# Patient Record
Sex: Female | Born: 1940 | Race: White | Hispanic: No | Marital: Married | Smoking: Never smoker
Health system: Southern US, Community
[De-identification: ages and names within clinical notes are randomized; demographics above are authoritative.]

## PROBLEM LIST (undated history)

## (undated) DIAGNOSIS — L719 Rosacea, unspecified: Secondary | ICD-10-CM

## (undated) DIAGNOSIS — G250 Essential tremor: Secondary | ICD-10-CM

## (undated) DIAGNOSIS — H409 Unspecified glaucoma: Secondary | ICD-10-CM

## (undated) DIAGNOSIS — E039 Hypothyroidism, unspecified: Secondary | ICD-10-CM

## (undated) DIAGNOSIS — E119 Type 2 diabetes mellitus without complications: Secondary | ICD-10-CM

## (undated) DIAGNOSIS — A3790 Whooping cough, unspecified species without pneumonia: Secondary | ICD-10-CM

## (undated) DIAGNOSIS — J309 Allergic rhinitis, unspecified: Secondary | ICD-10-CM

## (undated) DIAGNOSIS — I10 Essential (primary) hypertension: Secondary | ICD-10-CM

## (undated) DIAGNOSIS — M1990 Unspecified osteoarthritis, unspecified site: Secondary | ICD-10-CM

## (undated) DIAGNOSIS — I2510 Atherosclerotic heart disease of native coronary artery without angina pectoris: Secondary | ICD-10-CM

## (undated) DIAGNOSIS — E785 Hyperlipidemia, unspecified: Secondary | ICD-10-CM

## (undated) HISTORY — PX: CATARACT EXTRACTION: SUR2

## (undated) HISTORY — PX: RETINAL DETACHMENT SURGERY: SHX105

## (undated) HISTORY — DX: Essential tremor: G25.0

## (undated) HISTORY — DX: Rosacea, unspecified: L71.9

## (undated) HISTORY — DX: Hyperlipidemia, unspecified: E78.5

## (undated) HISTORY — DX: Unspecified glaucoma: H40.9

## (undated) HISTORY — DX: Allergic rhinitis, unspecified: J30.9

## (undated) HISTORY — PX: EYE SURGERY: SHX253

## (undated) HISTORY — DX: Whooping cough, unspecified species without pneumonia: A37.90

## (undated) HISTORY — DX: Hypothyroidism, unspecified: E03.9

## (undated) HISTORY — DX: Type 2 diabetes mellitus without complications: E11.9

## (undated) HISTORY — DX: Essential (primary) hypertension: I10

---

## 1999-02-17 ENCOUNTER — Other Ambulatory Visit: Admission: RE | Admit: 1999-02-17 | Discharge: 1999-02-17 | Payer: Self-pay | Admitting: *Deleted

## 1999-07-29 ENCOUNTER — Encounter: Payer: Self-pay | Admitting: Emergency Medicine

## 1999-07-29 ENCOUNTER — Emergency Department (HOSPITAL_COMMUNITY): Admission: EM | Admit: 1999-07-29 | Discharge: 1999-07-29 | Payer: Self-pay | Admitting: Emergency Medicine

## 1999-09-29 ENCOUNTER — Emergency Department (HOSPITAL_COMMUNITY): Admission: EM | Admit: 1999-09-29 | Discharge: 1999-09-29 | Payer: Self-pay | Admitting: Emergency Medicine

## 2000-09-06 ENCOUNTER — Other Ambulatory Visit: Admission: RE | Admit: 2000-09-06 | Discharge: 2000-09-06 | Payer: Self-pay | Admitting: *Deleted

## 2003-12-18 ENCOUNTER — Other Ambulatory Visit: Admission: RE | Admit: 2003-12-18 | Discharge: 2003-12-18 | Payer: Self-pay | Admitting: Family Medicine

## 2004-02-18 ENCOUNTER — Encounter: Admission: RE | Admit: 2004-02-18 | Discharge: 2004-02-18 | Payer: Self-pay | Admitting: Gastroenterology

## 2005-03-25 IMAGING — CR DG BE W/ AIR HIGH DENSITY
12 of 21 series · 12 of 21 positions shown · non-contrast
Comparison: none

CLINICAL DATA: Colon cancer screening, sigmoidoscopy earlier today.  
AIR CONTRAST BARIUM ENEMA: 
The preliminary supine and erect views of the abdomen demonstrate normal caliber gas filled colon and small bowel loops.  No free peritoneal air.  Mid to lower thoracic spine degenerative changes and mild lumbar spine degenerative changes. 
An air contrast barium enema examination of the colon demonstrates normal caliber and contour of the colon with no constricting lesions, masses, or mucosal abnormalities.  The appendix is incompletely opacified.  Barium refluxed into a normal appearing terminal ileum.

[run (1 of 6)]
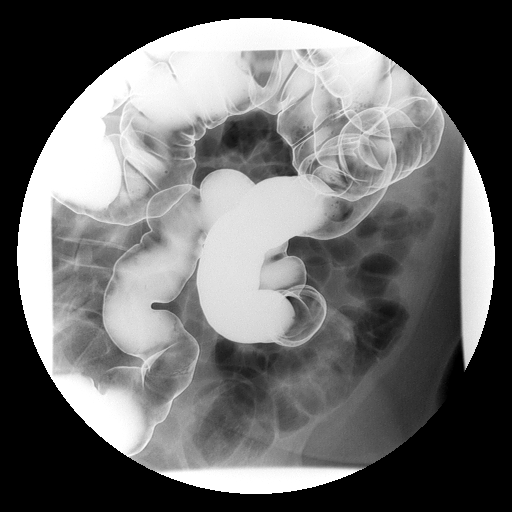

[run (2 of 6)]
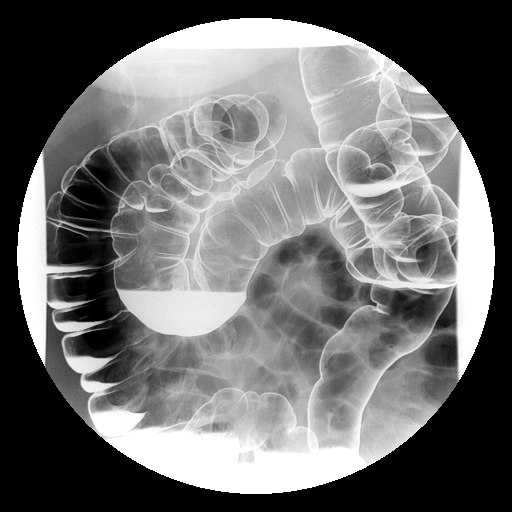

[run (3 of 6)]
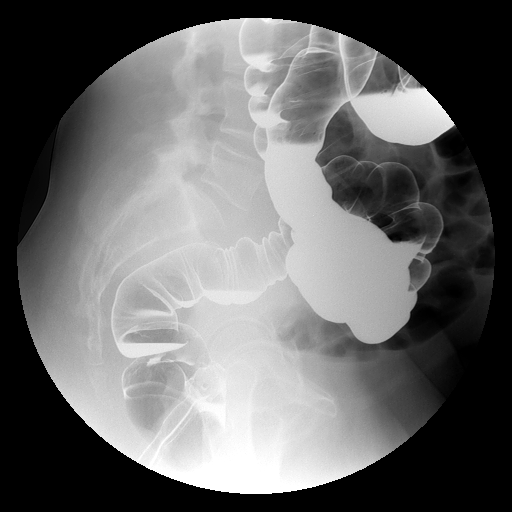

[run (4 of 6)]
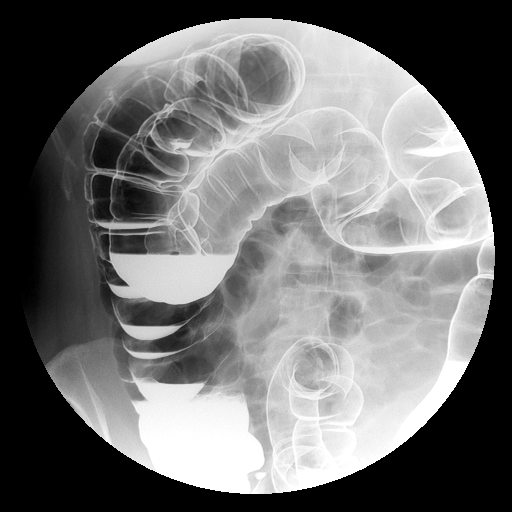

[run (5 of 6)]
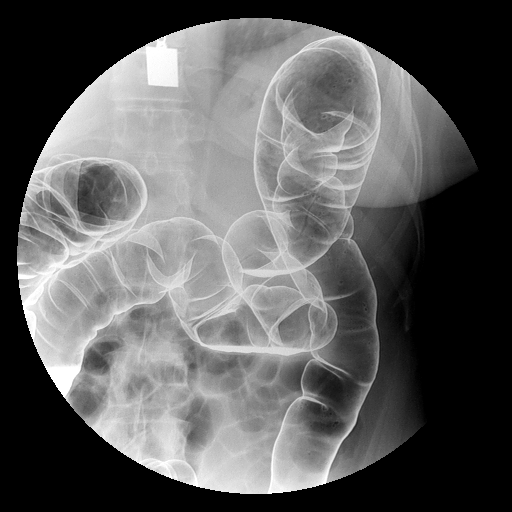

[run (6 of 6)]
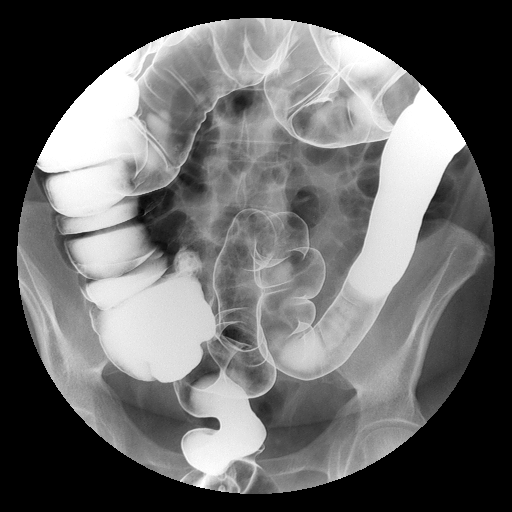

[view not recorded (1 of 6)]
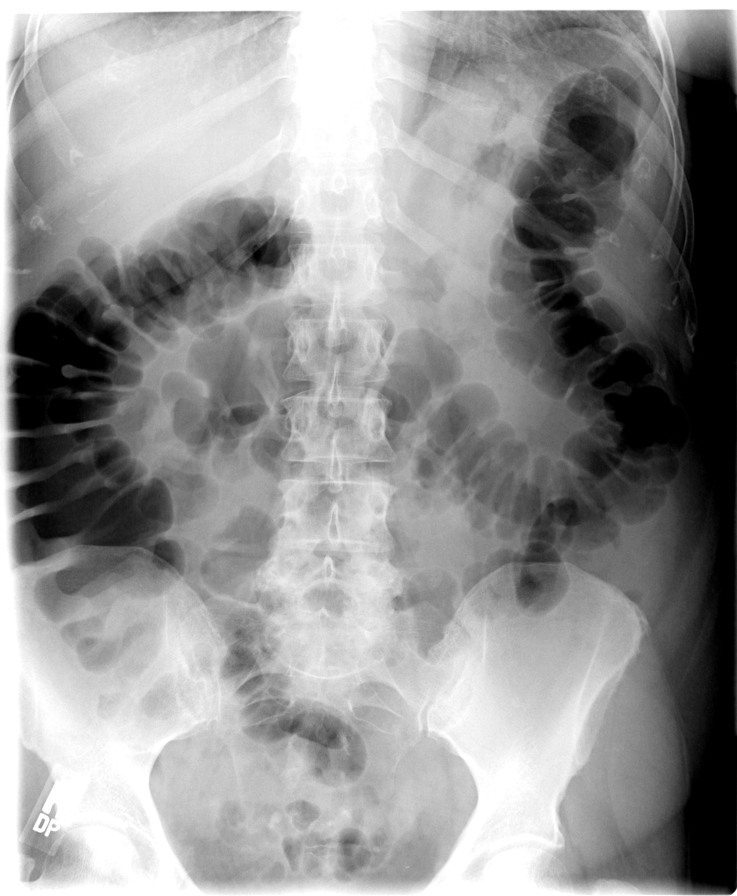

[view not recorded (2 of 6)]
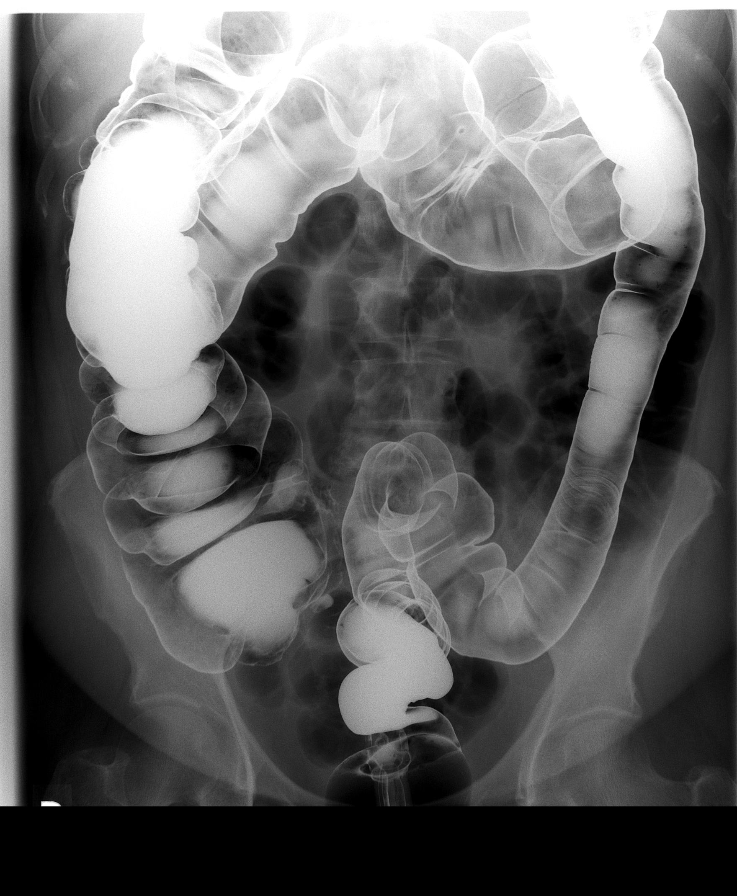

[view not recorded (3 of 6)]
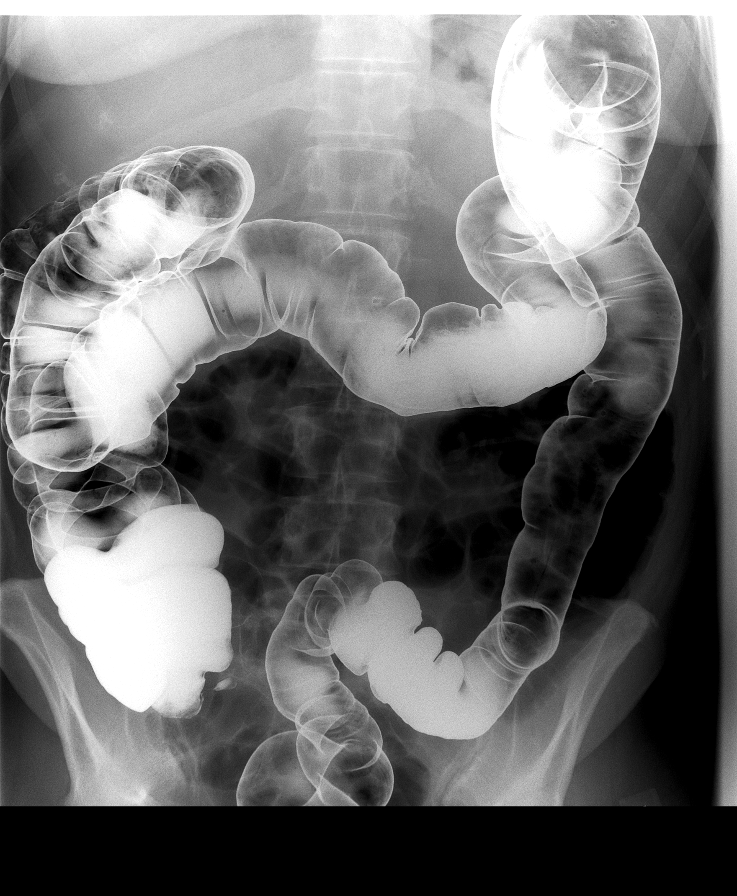

[view not recorded (4 of 6)]
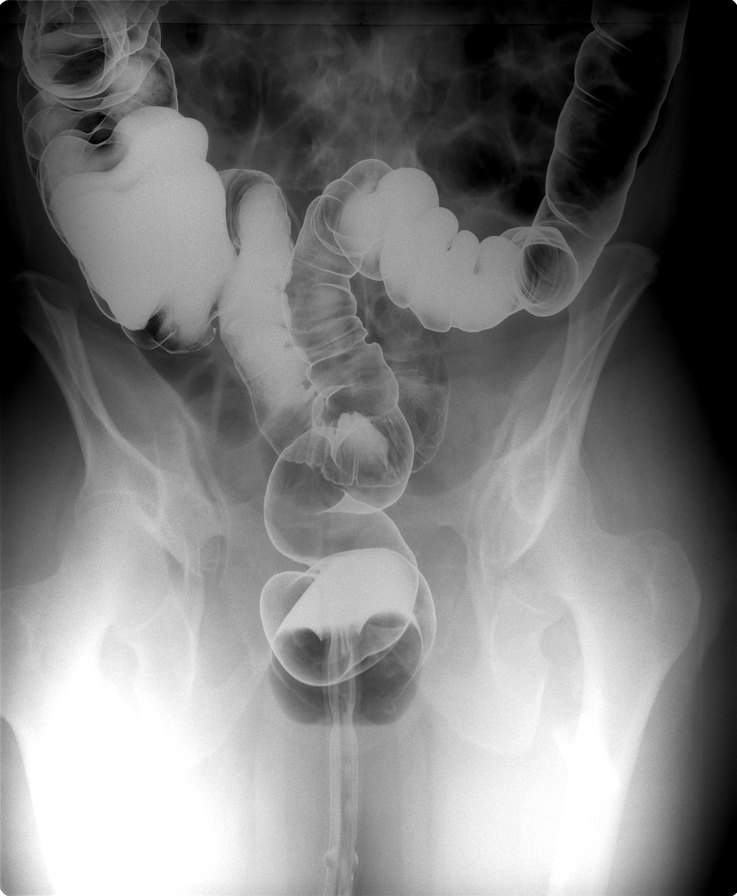

[view not recorded (5 of 6)]
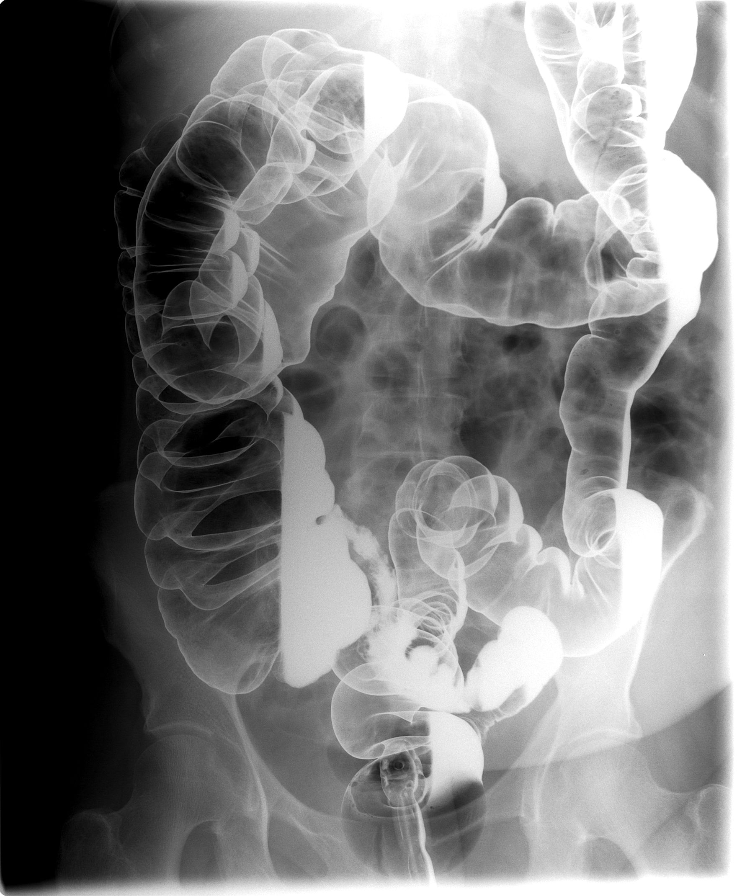

[view not recorded (6 of 6)]
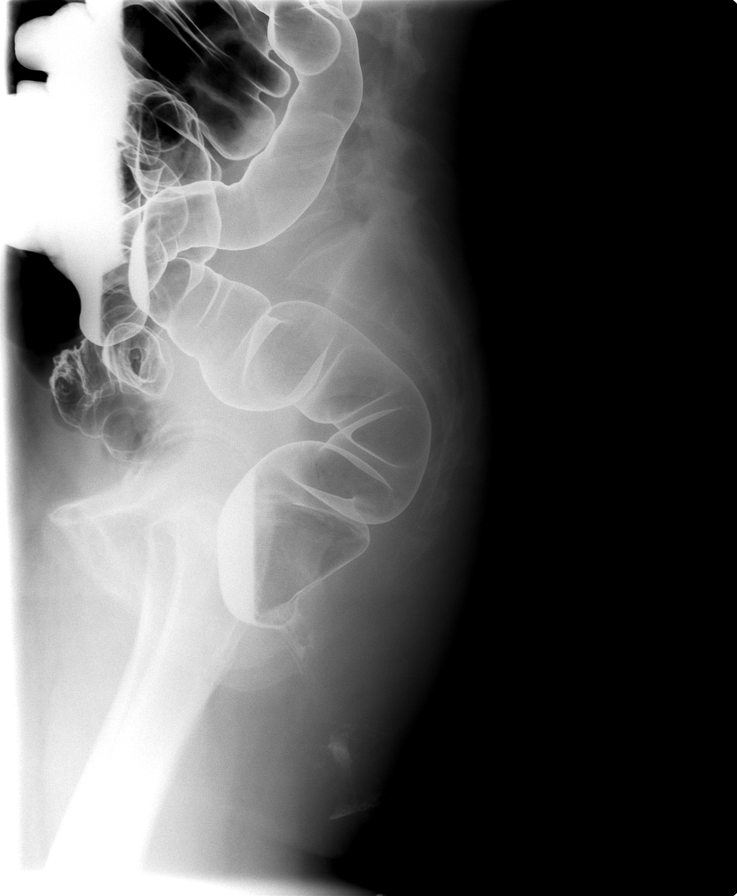

[12 of 21 positions shown; findings below may reference images not displayed]

IMPRESSION: Normal examination.

## 2005-09-01 ENCOUNTER — Other Ambulatory Visit: Admission: RE | Admit: 2005-09-01 | Discharge: 2005-09-01 | Payer: Self-pay | Admitting: Family Medicine

## 2006-11-30 ENCOUNTER — Other Ambulatory Visit: Admission: RE | Admit: 2006-11-30 | Discharge: 2006-11-30 | Payer: Self-pay | Admitting: Family Medicine

## 2008-11-05 DIAGNOSIS — H409 Unspecified glaucoma: Secondary | ICD-10-CM

## 2008-11-05 HISTORY — DX: Unspecified glaucoma: H40.9

## 2008-12-07 ENCOUNTER — Other Ambulatory Visit: Admission: RE | Admit: 2008-12-07 | Discharge: 2008-12-07 | Payer: Self-pay | Admitting: Family Medicine

## 2011-03-09 ENCOUNTER — Other Ambulatory Visit: Payer: Self-pay | Admitting: Family Medicine

## 2011-03-09 ENCOUNTER — Other Ambulatory Visit (HOSPITAL_COMMUNITY)
Admission: RE | Admit: 2011-03-09 | Discharge: 2011-03-09 | Disposition: A | Discharge status: Home / Self Care | Payer: Medicare Other | Source: Ambulatory Visit | Attending: Family Medicine | Admitting: Family Medicine

## 2011-03-09 DIAGNOSIS — Z124 Encounter for screening for malignant neoplasm of cervix: Secondary | ICD-10-CM | POA: Insufficient documentation

## 2012-06-07 ENCOUNTER — Other Ambulatory Visit: Payer: Self-pay | Admitting: Internal Medicine

## 2012-06-07 DIAGNOSIS — E039 Hypothyroidism, unspecified: Secondary | ICD-10-CM

## 2012-06-09 ENCOUNTER — Ambulatory Visit
Admission: RE | Admit: 2012-06-09 | Discharge: 2012-06-09 | Disposition: A | Discharge status: Home / Self Care | Payer: Medicare Other | Source: Ambulatory Visit | Attending: Internal Medicine | Admitting: Internal Medicine

## 2012-06-09 DIAGNOSIS — E039 Hypothyroidism, unspecified: Secondary | ICD-10-CM

## 2013-07-10 ENCOUNTER — Telehealth: Payer: Self-pay | Admitting: Interventional Cardiology

## 2013-07-10 NOTE — Telephone Encounter (Signed)
New problem   Pt is coming in office tomorrow morning and they need to know today if pt can take epinephrine. Please advise

## 2013-07-10 NOTE — Telephone Encounter (Signed)
Will forward to dr/cma

## 2013-07-10 NOTE — Telephone Encounter (Signed)
Spoke with pt and Dr. Jolayne Haines pts dentist wants to fix pt crown, but pts is concerned that they may give her a medication that may make her heart beat faster.  Pt didnt go off propranolol because she had trouble trying to come off medication. Please Advise.

## 2013-07-11 NOTE — Telephone Encounter (Signed)
notified Donna Guerrero per Dr. Eldridge Dace pt should not have epinephrine (caused fat HR's) and they should use carbocaine.

## 2013-07-11 NOTE — Telephone Encounter (Signed)
Follow up    Pt's dental procedure is today at 10:30 and they need prior approval for medication . Please advise

## 2013-07-15 IMAGING — US US SOFT TISSUE HEAD/NECK
1 series · 14 of 25 positions shown · non-contrast
Comparison: None.

CLINICAL DATA: Hypothyroidism, history of neck radiation, possible
nodules

THYROID ULTRASOUND
TECHNIQUE: Ultrasound examination of the thyroid gland and adjacent
soft tissues was performed.

[Series 1: us soft tissue head/neck · 0.07mm/px · 14 of 39 slices shown]
[im 1/39]
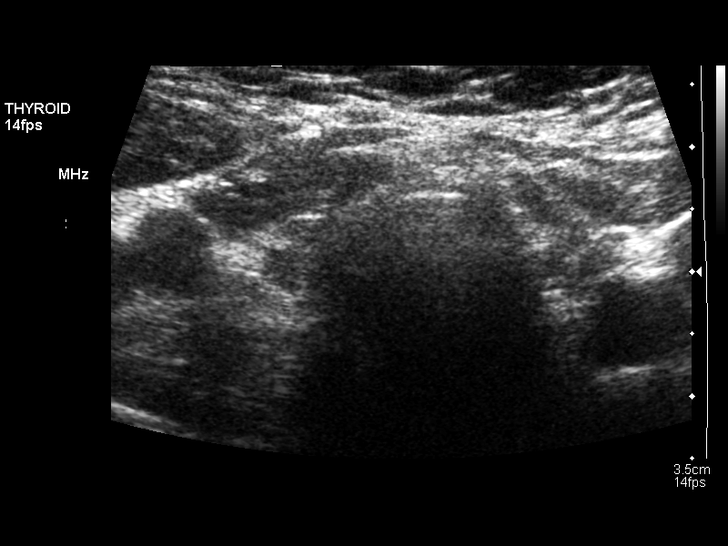
[im 4/39]
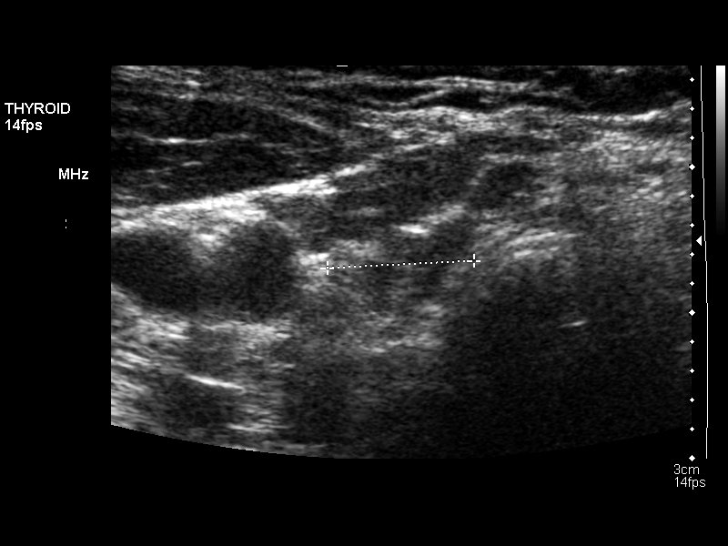
[im 7/39]
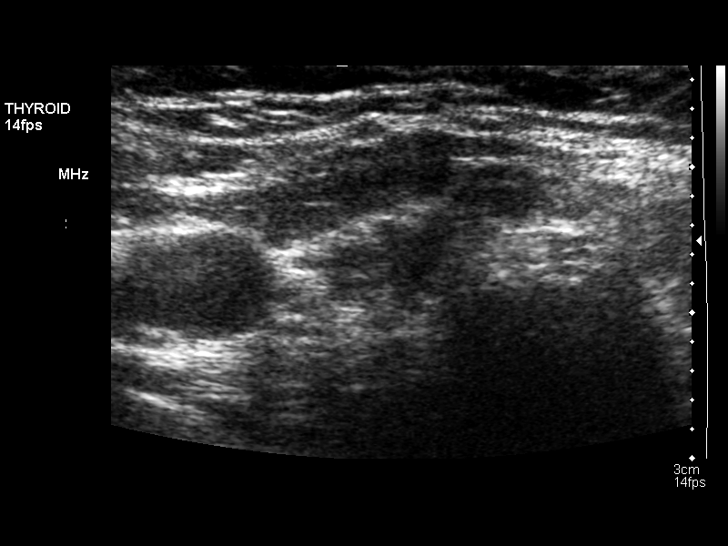
[im 10/39]
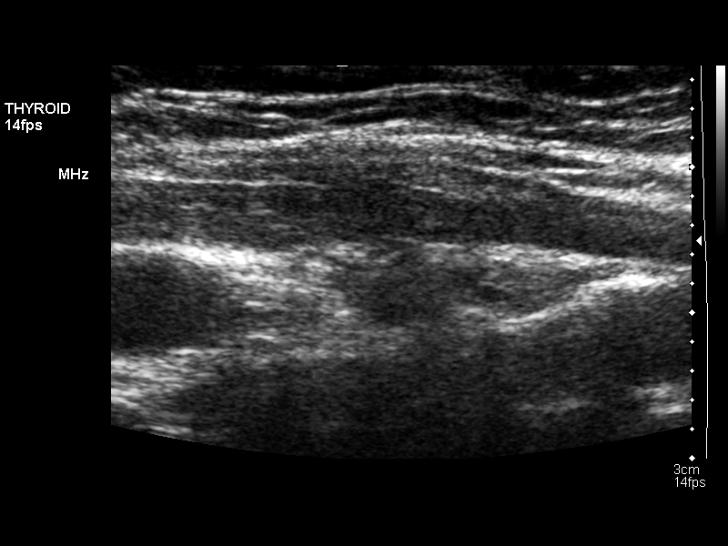
[im 13/39]
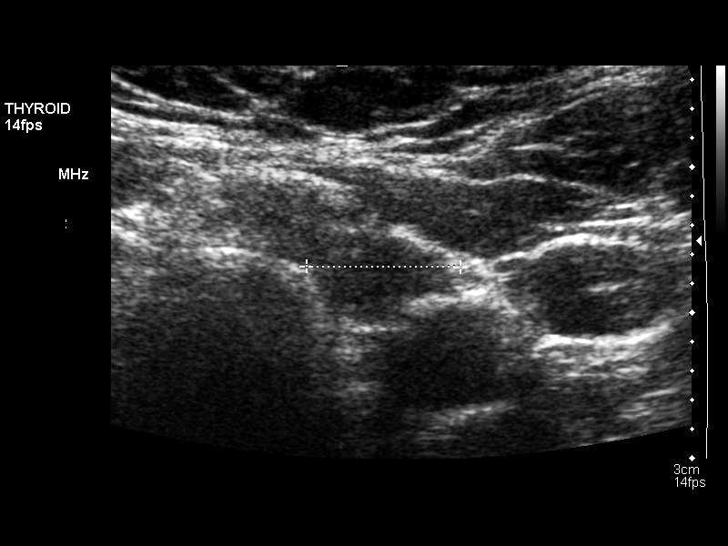
[im 15/39]
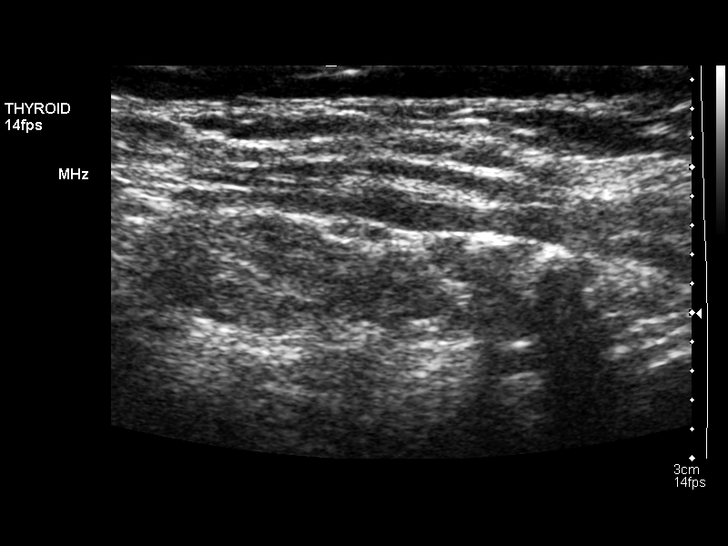
[im 18/39]
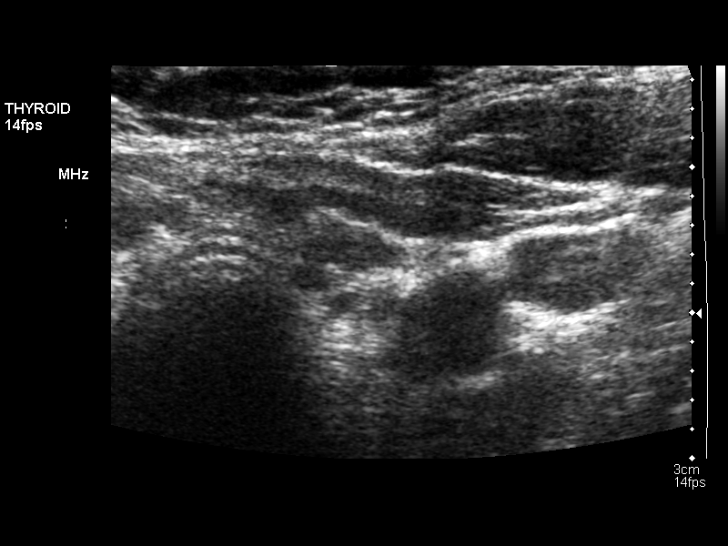
[im 21/39]
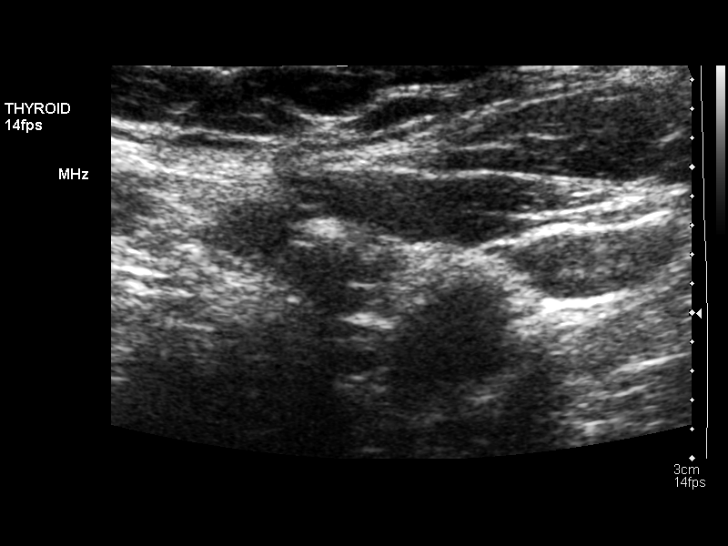
[im 24/39]
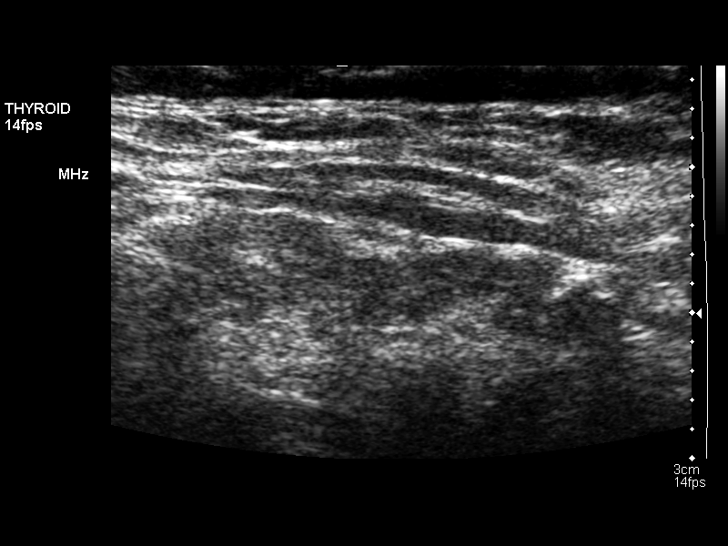
[im 26/39]
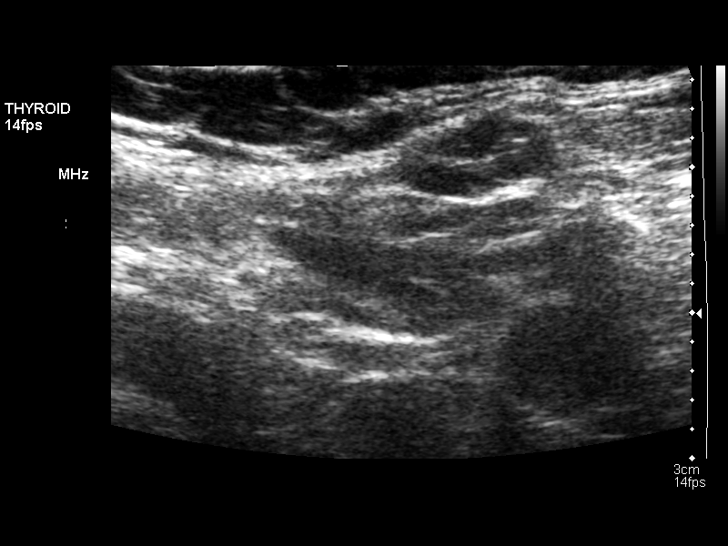
[im 29/39]
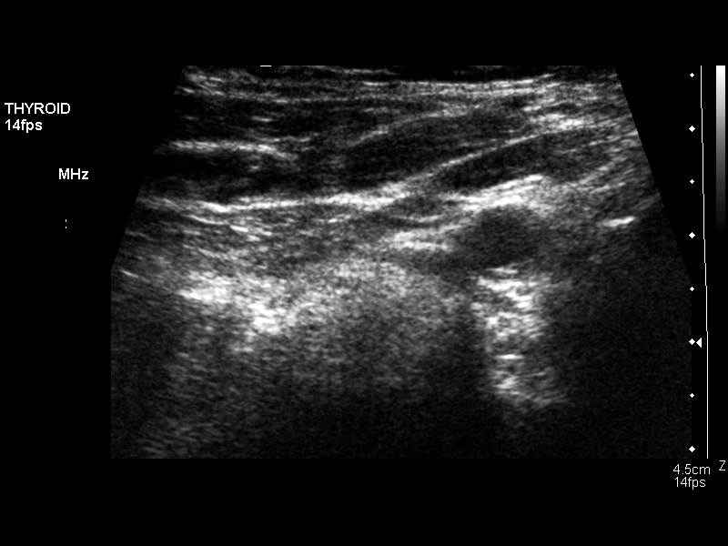
[im 32/39]
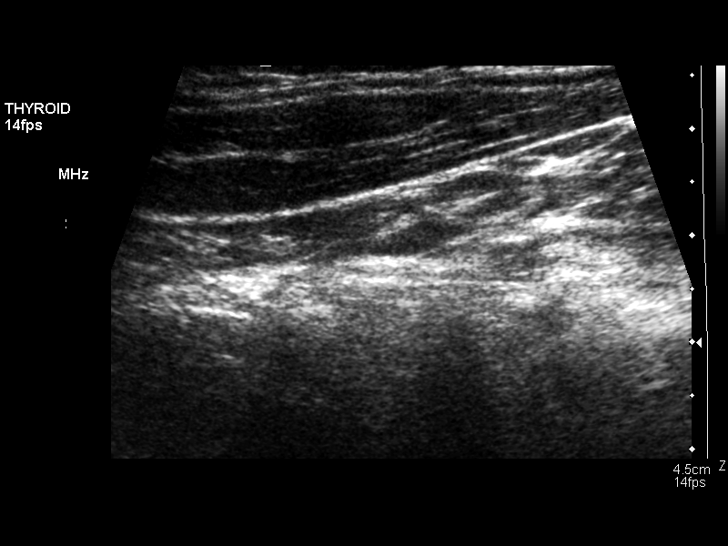
[im 35/39]
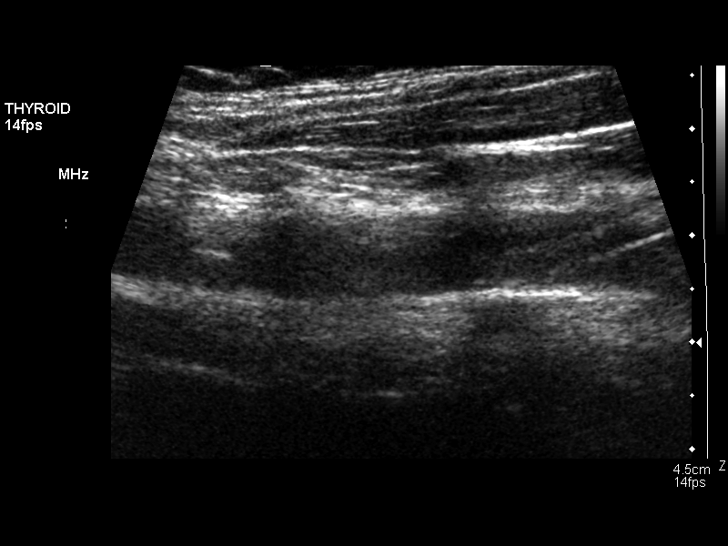
[im 39/39]
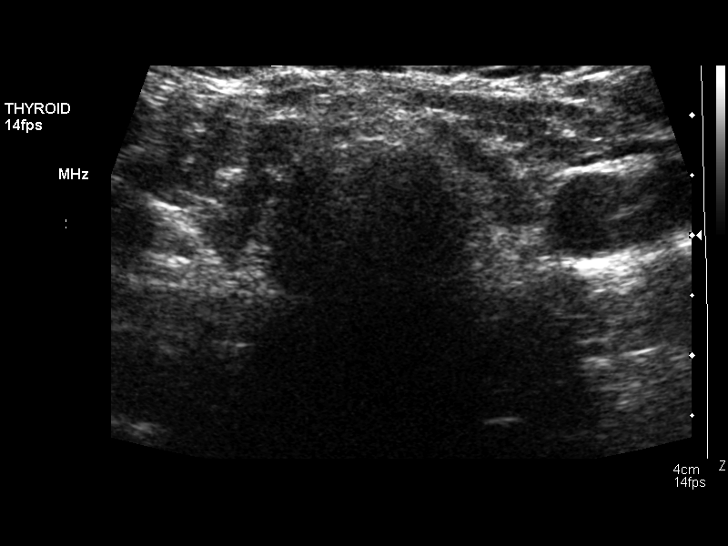

[14 of 25 positions shown; findings below may reference images not displayed]

FINDINGS: Right thyroid lobe:  Measures 1.7 x 0.8 x 1.0 cm.
Left thyroid lobe:  Measures 3.2 x 0.9 x 1.1 cm
Isthmus:  Measures 4 mm in thickness.

Focal nodules:  Two possible 3 mm calcifications in the left lower
gland.

Lymphadenopathy:  None visualized.
IMPRESSION: Two possible 3 mm calcifications in the left lower gland.

No discrete thyroid nodules.

## 2013-10-03 ENCOUNTER — Encounter: Payer: Self-pay | Admitting: Cardiology

## 2013-10-03 DIAGNOSIS — E118 Type 2 diabetes mellitus with unspecified complications: Secondary | ICD-10-CM | POA: Insufficient documentation

## 2013-10-03 DIAGNOSIS — E119 Type 2 diabetes mellitus without complications: Secondary | ICD-10-CM | POA: Insufficient documentation

## 2013-10-03 DIAGNOSIS — E782 Mixed hyperlipidemia: Secondary | ICD-10-CM

## 2013-10-03 DIAGNOSIS — I10 Essential (primary) hypertension: Secondary | ICD-10-CM | POA: Insufficient documentation

## 2013-10-03 DIAGNOSIS — E039 Hypothyroidism, unspecified: Secondary | ICD-10-CM | POA: Insufficient documentation

## 2013-10-12 ENCOUNTER — Ambulatory Visit: Payer: Medicare Other | Admitting: Interventional Cardiology

## 2013-11-14 ENCOUNTER — Ambulatory Visit (INDEPENDENT_AMBULATORY_CARE_PROVIDER_SITE_OTHER): Payer: Medicare Other | Admitting: Interventional Cardiology

## 2013-11-14 ENCOUNTER — Encounter: Payer: Self-pay | Admitting: Interventional Cardiology

## 2013-11-14 VITALS — BP 170/70 | HR 60 | Ht 60.0 in | Wt 150.0 lb

## 2013-11-14 DIAGNOSIS — E782 Mixed hyperlipidemia: Secondary | ICD-10-CM

## 2013-11-14 DIAGNOSIS — R0602 Shortness of breath: Secondary | ICD-10-CM

## 2013-11-14 DIAGNOSIS — I10 Essential (primary) hypertension: Secondary | ICD-10-CM

## 2013-11-14 DIAGNOSIS — Z79899 Other long term (current) drug therapy: Secondary | ICD-10-CM

## 2013-11-14 MED ORDER — LISINOPRIL 40 MG PO TABS: 40.0000 mg | ORAL_TABLET | Freq: Every day | ORAL | Status: DC

## 2013-11-14 MED ORDER — HYDROCHLOROTHIAZIDE 25 MG PO TABS: 25.0000 mg | ORAL_TABLET | Freq: Every day | ORAL | Status: DC

## 2013-11-14 NOTE — Patient Instructions (Addendum)
Your physician has recommended you make the following change in your medication:   1. Stop Lisinopril-HCTZ 20-25 mg.   2. Start Lisinopril 40 mg and HCTZ 25 mg daily.   Your physician recommends that you schedule a follow-up appointment in: 2 months with Dr. Irish Lack.  Your physician recommends that you return for lab work in: 1 week for [facility] on 11/21/13.   I will call you back to schedule Vascular screening of your Carotids and Aorta.

## 2013-11-14 NOTE — Progress Notes (Signed)
Patient ID: Donna Guerrero, female   DOB: [birth year], 73 y.o.   MRN: [id number]    [address], [address] [place], [place]  [address] Phone: [phone] Fax:  [phone]  Date:  11/14/2013   ID:  Donna Guerrero, DOB 04/12/1941, MRN [id number]  PCP:  Osborne Casco, MD      History of Present Illness: Donna Guerrero is a 73 y.o. female who has had HTN. Propranolol was switched to Metoprolol. She has been on simvastatin and atorvastatin but had side effects from both. Crestor was started. She is now off of the crestor. Hbg A1C was elevated so Amaryl was started. Amaryl was stopped as well. Metformin was started. BP has been high in the doctors office. At home, BP is 135-145/70 range. She had muscle pains with atorvastatin. She feels better off of the statins. She is tolerating Zetia. She has not been checking her BP as regularly. Hypertension:  She was doing Zumba for seniors twice a week. She walks in the gym as well. No symptoms. Denies : Chest pain.  Dizziness.  Palpitations.  Syncope.   In 9/14, she tried to taper her propranolol.  She had some shakiness and withdrawal sx.  She is unsure if she wants to be on the beta blocker or not.  SHe had some SHOB walkig up an incline.  SHe typically walks on flat surfaces.  Thyroid meds to be adjusted.  She has ben intolerant of multiple statins due to muscle pain and headaches. Dr. Buddy Duty recommended welchol.  She has been under a lot of stress at home due to some family issues. She thinks her blood pressure is increased because of this as well.   Wt Readings from Last 3 Encounters:  11/14/13 150 lb (68.04 kg)     Past Medical History  Diagnosis Date  . Diabetes mellitus without complication     TYPE II  . Hypertension   . Thyroid disease     HYPOTHYROIDISM  . Essential tremor   . Allergic rhinitis   . Rosacea   . Whooping cough   . 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 Hyperlipidemia     INTOLERANT OF STATINS    Current Outpatient  Prescriptions  Medication Sig Dispense Refill  . aspirin 81 MG tablet Take 81 mg by mouth daily.      Marland Kitchen ezetimibe (ZETIA) 10 MG tablet Take 10 mg by mouth daily.      . fexofenadine (ALLEGRA) 180 MG tablet Take 180 mg by mouth as needed for allergies or rhinitis.      Marland Kitchen glucose blood test strip 1 each by Other route as needed for other. Use as instructed      . latanoprost (XALATAN) 0.005 % ophthalmic solution 1 drop at bedtime.      Marland Kitchen levothyroxine (SYNTHROID, LEVOTHROID) 137 MCG tablet Take 137 mcg by mouth daily before breakfast. Reduced to 125 mcg      . lisinopril-hydrochlorothiazide (PRINZIDE,ZESTORETIC) 20-25 MG per tablet Take 1 tablet by mouth daily.      . metFORMIN (GLUMETZA) 500 MG (MOD) 24 hr tablet Take 500 mg by mouth daily with breakfast.      . Multiple Vitamins-Minerals (MULTIVITAMIN PO) Take by mouth daily.      . naproxen sodium (ANAPROX) 220 MG tablet Take 220 mg by mouth 2 (two) times daily with a meal.      . propranolol (INDERAL) 80 MG tablet Take 80 mg by mouth daily.  No current facility-administered medications for this visit.    Allergies:    Allergies  Allergen Reactions  . Amaryl [Glimepiride] Other (See Comments)    headache  . Atorvastatin     MYALGIAS  . Codeine Nausea Only  . Crestor [Rosuvastatin]     Myalgias  . Simvastatin Nausea Only  . Welchol [Colesevelam Hcl]     Tablets were too big    Social History:  The patient  reports that she has never smoked. She does not have any smokeless tobacco history on file. She reports that she drinks about 1.2 ounces of alcohol per week. She reports that she does not use illicit drugs.   Family History:  The patient's family history includes CAD in her father; Heart disease in her mother.   ROS:  Please see the history of present illness.  No nausea, vomiting.  No fevers, chills.  No focal weakness.  No dysuria.   All other systems reviewed and negative.   PHYSICAL EXAM: VS:  BP 170/70  Pulse 60   Ht 5' (1.524 m)  Wt 150 lb (68.04 kg)  BMI 29.30 kg/m2 Well nourished, well developed, in no acute distress HEENT: normal Neck: no JVD, no carotid bruits Cardiac:  normal S1, S2; RRR;  Lungs:  clear to auscultation bilaterally, no wheezing, rhonchi or rales Abd: soft, nontender, no hepatomegaly Ext: no edema Skin: warm and dry Neuro:   no focal abnormalities noted  EKG:  NSR, septal Q waves, no ST segment changes     ASSESSMENT AND PLAN:  Benign hypertension   stop Lisinopril-Hydrochlorothiazide Tablet, 20-25 MG, 1 tablet, once a day Not well controlled today. She has had a lot of stress at home of late.  She should have a systolic BP less than 528 given increased blood sugar. She should check her BP at home. Diastolic should be less than 80. Agree with change to Metoprolol. increase lisinopril since BP consistently over 130/80.   Increase lisinopril to 40 mg a day. Continue HCTZ 25 mg daily.   Mixed hyperlipidemia  Continue Zetia Tablet, 10 MG, 1 tablet, Orally, Once a day LDL target < 100. Did not tolerate Lipitor or Zocor. Now on Zetia. WelChol would be reasonable. She is somewhat hesitant to start any medications due to problems with cholesterol lowering medicines she's had in the past. We'll have our pharmD review her lipids and give a recommendation.  Dyspnea on exertion: This occurred while walking up an incline. She does not typically walk on uneven surfaces. She is okay walking on flat surfaces. I think it is likely deconditioning. There are no overt signs of heart failure.   Signed, Mina Marble, MD, [facility] 11/14/2013 12:38 PM

## 2013-11-15 ENCOUNTER — Other Ambulatory Visit: Payer: Self-pay | Admitting: Pharmacist

## 2013-11-15 DIAGNOSIS — E782 Mixed hyperlipidemia: Secondary | ICD-10-CM

## 2013-11-15 NOTE — Telephone Encounter (Signed)
Patient with h/o diabetes and elevated LDL 155 mg/dL on Zetia monotherapy.   Her labs from Dr. Buddy Duty are getting scanned in:  LDL 155, TC 237, HDL 63, TG 96, glucose 136, LFTs normal, renal fxn normal. Intolerant to Zocor, Lipitor, Crestor - muscle aches. She failed Welchol pills in past due to size of pills (these are large - and 6 daily).   Patient is hesitant to use lipid lowering medications given her past failures.  Dr. Buddy Duty recommended Welchol again, which I think is a good decision - I would instead use the Welchol powder as this is mixed in 4-8 ounces of water and juice and she can drink once daily.  Large pills no longer a concern.  Welchol is not a statin, and doesn't cause muscle aches.  She will need to separate this from her thyroid medication though. Plan: 1.  Continue Zetia. 2.  Add Welchol powder for suspension - mix 1 packet in 4-8 ounces of juice or water and drink once daily.  I recommend doing this in the evening, and the thyroid dose in the morning so they don't interact. 3.  Recheck lipid panel and hepatic panel 3 months later. Please notify patient, update meds, and set up labs. Thanks.

## 2013-11-17 ENCOUNTER — Ambulatory Visit (HOSPITAL_COMMUNITY): Payer: Medicare Other | Attending: Interventional Cardiology

## 2013-11-17 ENCOUNTER — Ambulatory Visit (HOSPITAL_COMMUNITY): Payer: Medicare Other

## 2013-11-17 DIAGNOSIS — I10 Essential (primary) hypertension: Secondary | ICD-10-CM

## 2013-11-17 NOTE — Telephone Encounter (Signed)
Patient should start Welchol daily then and recheck lipid/liver in 3 months. Please find out why Zetia was stopped and document it in this note.  Thanks.

## 2013-11-17 NOTE — Telephone Encounter (Addendum)
Donna Guerrero, pt was in the office today and she is no longer taking zetia. She told someone at last OV but they didn't take it off her list.

## 2013-11-20 NOTE — Telephone Encounter (Signed)
Needs to take Welchol 625 mg pills - 3 tablets bid.  Please send in prescription for this, and set up lipid / liver for 3 months later.

## 2013-11-20 NOTE — Telephone Encounter (Signed)
Spoke with pt and she had problems with all statins. Pt also tried zetia and had myalgias and HA.

## 2013-11-20 NOTE — Telephone Encounter (Signed)
Pt is willing to take the welchol but she doesn't want to take the powder. She would like to try the welchol tablet. Please advise.

## 2013-11-21 ENCOUNTER — Other Ambulatory Visit: Payer: Medicare Other

## 2013-11-22 MED ORDER — COLESEVELAM HCL 625 MG PO TABS: ORAL_TABLET | ORAL | Status: DC

## 2013-11-22 NOTE — Telephone Encounter (Signed)
Pt notified. Meds updated. Labs ordered.  

## 2013-11-23 ENCOUNTER — Other Ambulatory Visit (INDEPENDENT_AMBULATORY_CARE_PROVIDER_SITE_OTHER): Payer: Medicare Other

## 2013-11-23 DIAGNOSIS — Z79899 Other long term (current) drug therapy: Secondary | ICD-10-CM

## 2013-11-23 LAB — BASIC METABOLIC PANEL
BUN: 19 mg/dL (ref 6–23)
CALCIUM: 9.1 mg/dL (ref 8.4–10.5)
CO2: 27 mEq/L (ref 19–32)
Chloride: 96 mEq/L (ref 96–112)
Creatinine, Ser: 0.7 mg/dL (ref 0.4–1.2)
GFR: 81.93 mL/min (ref >60.00)
GLUCOSE: 243 mg/dL — AB (ref 70–99)
Potassium: 3.3 mEq/L — ABNORMAL LOW (ref 3.5–5.1)
Sodium: 134 mEq/L — ABNORMAL LOW (ref 135–145)

## 2013-11-24 ENCOUNTER — Telehealth: Payer: Self-pay | Admitting: Cardiology

## 2013-11-24 DIAGNOSIS — E876 Hypokalemia: Secondary | ICD-10-CM

## 2013-11-24 MED ORDER — POTASSIUM CHLORIDE CRYS ER 20 MEQ PO TBCR: 20.0000 meq | EXTENDED_RELEASE_TABLET | Freq: Every day | ORAL | Status: DC

## 2013-11-24 NOTE — Telephone Encounter (Signed)
Pt notified. Meds updated. Pt spoke with pharmacy regarding welchol and it was going to cost her $150 a month (which she can not afford). Pt obtained some books on cholesterol and would like to try and lose weight. Also, she will watch her diet and maybe think about trying another statin medication. She would like to wait until appt in April to see where she is at with her diet and weigh loss.

## 2013-11-24 NOTE — Telephone Encounter (Signed)
Pt notified. FYI to Dr. Irish Lack.

## 2013-11-24 NOTE — Telephone Encounter (Signed)
Can't tolerate multiple statins or Zetia, and Welchol cost too much.  Okay to work on diet and exercise until 01/2014.  She may qualify for PCSK-9 study.  Can discuss in 01/2014 if not willing to try a statin.

## 2013-11-24 NOTE — Telephone Encounter (Signed)
Message copied by Alcario Drought on Fri Nov 24, 2013  1:04 PM ------      Message from: Jettie Booze      Created: Fri Nov 24, 2013 10:51 AM       Can start K-Dur 20 mEq daily. Recheck Bmet in one week. ------

## 2013-12-01 ENCOUNTER — Other Ambulatory Visit (INDEPENDENT_AMBULATORY_CARE_PROVIDER_SITE_OTHER): Payer: Medicare Other

## 2013-12-01 DIAGNOSIS — E876 Hypokalemia: Secondary | ICD-10-CM

## 2013-12-01 LAB — BASIC METABOLIC PANEL
BUN: 18 mg/dL (ref 6–23)
CO2: 29 mEq/L (ref 19–32)
Calcium: 9.4 mg/dL (ref 8.4–10.5)
Chloride: 100 mEq/L (ref 96–112)
Creatinine, Ser: 0.8 mg/dL (ref 0.4–1.2)
GFR: 75.97 mL/min (ref >60.00)
Glucose, Bld: 185 mg/dL — ABNORMAL HIGH (ref 70–99)
Potassium: 3.5 mEq/L (ref 3.5–5.1)
Sodium: 136 mEq/L (ref 135–145)

## 2014-01-01 ENCOUNTER — Other Ambulatory Visit: Payer: Self-pay | Admitting: Interventional Cardiology

## 2014-01-31 ENCOUNTER — Ambulatory Visit (INDEPENDENT_AMBULATORY_CARE_PROVIDER_SITE_OTHER): Payer: Medicare Other | Admitting: Interventional Cardiology

## 2014-01-31 ENCOUNTER — Encounter: Payer: Self-pay | Admitting: Internal Medicine

## 2014-01-31 ENCOUNTER — Encounter: Payer: Self-pay | Admitting: Interventional Cardiology

## 2014-01-31 VITALS — BP 138/84 | HR 58 | Ht 60.0 in | Wt 151.4 lb

## 2014-01-31 DIAGNOSIS — I10 Essential (primary) hypertension: Secondary | ICD-10-CM

## 2014-01-31 DIAGNOSIS — E782 Mixed hyperlipidemia: Secondary | ICD-10-CM

## 2014-01-31 NOTE — Progress Notes (Signed)
Patient ID: Donna Guerrero, female   DOB: 02/11/1941, 73 y.o.   MRN: [id number]    [place], [address] [place], [place]  [address] Phone: [phone] Fax:  [phone]  Date:  01/31/2014   ID:  Donna Guerrero, DOB 1941-08-04, MRN [id number]  PCP:  Osborne Casco, MD      History of Present Illness: Donna Guerrero is a 73 y.o. female who has had HTN. Propranolol was switched to Metoprolol. She has been on simvastatin and atorvastatin but had side effects from both. Crestor was started. She is now off of the crestor. Hbg A1C was elevated so Amaryl was started. Amaryl was stopped as well. Metformin was started. BP has been high in the doctors office. At home, BP is 135-145/70 range. She had muscle pains with atorvastatin. She feels better off of the statins. She is tolerating Zetia. She has not been checking her BP as regularly. Hypertension:  She was doing [employer]a for seniors twice a week. She walks in the gym as well. No symptoms. Denies : Chest pain.  Dizziness.  Palpitations.  Syncope.   In 9/14, she tried to taper her propranolol.  She had some shakiness and withdrawal sx.  She is unsure if she wants to be on the beta blocker or not.  SHe had some SHOB walkig up an incline.  SHe typically walks on flat surfaces.  Thyroid meds to be adjusted.  She has ben intolerant of multiple statins due to muscle pain and headaches. Dr. Buddy Duty recommended welchol.  She has been under a lot of stress at home due to some family issues. She thinks her blood pressure was increased because of this as well.  Since last visit, stress level is better.  BP is much better as well. She is continuing her propranolol.    Wt Readings from Last 3 Encounters:  01/31/14 151 lb 6.4 oz (68.675 kg)  11/14/13 150 lb (68.04 kg)     Past Medical History  Diagnosis Date  . Diabetes mellitus without complication     TYPE II  . Hypertension   . Thyroid disease     HYPOTHYROIDISM  . Essential tremor   .  Allergic rhinitis   . Rosacea   . Whooping cough   . 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 Hyperlipidemia     INTOLERANT OF STATINS    Current Outpatient Prescriptions  Medication Sig Dispense Refill  . aspirin 81 MG tablet Take 81 mg by mouth daily.      Marland Kitchen glucose blood test strip 1 each by Other route as needed for other. Use as instructed      . hydrochlorothiazide (HYDRODIURIL) 25 MG tablet Take 1 tablet (25 mg total) by mouth daily.  30 tablet  6  . latanoprost (XALATAN) 0.005 % ophthalmic solution 1 drop at bedtime.      Marland Kitchen levothyroxine (SYNTHROID, LEVOTHROID) 137 MCG tablet Take 112 mcg by mouth daily before breakfast. Reduced to 125 mcg      . lisinopril (PRINIVIL,ZESTRIL) 40 MG tablet Take 1 tablet (40 mg total) by mouth daily.  30 tablet  6  . metFORMIN (GLUMETZA) 500 MG (MOD) 24 hr tablet Take 500 mg by mouth daily with breakfast.      . naproxen sodium (ANAPROX) 220 MG tablet Take 220 mg by mouth 2 (two) times daily with a meal.      . potassium chloride SA (K-DUR,KLOR-CON) 20 MEQ tablet Take 1 tablet (20 mEq total) by mouth daily.  30 tablet  6  . propranolol (INDERAL) 80 MG tablet Take 80 mg by mouth daily.      . propranolol ER (INDERAL LA) 80 MG 24 hr capsule take 1 capsule by mouth once daily  30 capsule  0   No current facility-administered medications for this visit.    Allergies:    Allergies  Allergen Reactions  . Amaryl [Glimepiride] Other (See Comments)    headache  . Atorvastatin     MYALGIAS  . Codeine Nausea Only  . Crestor [Rosuvastatin]     Myalgias  . Simvastatin Nausea Only  . Welchol [Colesevelam Hcl]     Tablets were too big    Social History:  The patient  reports that she has never smoked. She does not have any smokeless tobacco history on file. She reports that she drinks about 1.2 ounces of alcohol per week. She reports that she does not use illicit drugs.   Family History:  The patient's family history includes CAD in her father; Heart disease in her mother.    ROS:  Please see the history of present illness.  No nausea, vomiting.  No fevers, chills.  No focal weakness.  No dysuria.   All other systems reviewed and negative.   PHYSICAL EXAM: VS:  BP 138/84  Pulse 58  Ht 5' (1.524 m)  Wt 151 lb 6.4 oz (68.675 kg)  BMI 29.57 kg/m2  SpO2 99% Well nourished, well developed, in no acute distress HEENT: normal Neck: no JVD, no carotid bruits Cardiac:  normal S1, S2; RRR;  Lungs:  clear to auscultation bilaterally, no wheezing, rhonchi or rales Abd: soft, nontender, no hepatomegaly Ext: no edema Skin: warm and dry Neuro:   no focal abnormalities noted  EKG:  NSR, septal Q waves, no ST segment changes     ASSESSMENT AND PLAN:  Benign hypertension   stopped Lisinopril-Hydrochlorothiazide Tablet, 20-25 MG, 1 tablet, once a day Better controlled today. Readings at home have ben better, systolics around 481.  She has had a lot of stress at home of late, but this is better.  She should have a systolic BP less than 856 given increased blood sugar. She should check her BP at home. Diastolic should be less than 80. Agree with change to Metoprolol.    Increased lisinopril to 40 mg a day. Continue HCTZ 25 mg daily. She did not want to make any further changes today.   Mixed hyperlipidemia  Continue Zetia Tablet, 10 MG, 1 tablet, Orally, Once a day LDL target < 100. Did not tolerate Lipitor or Zocor. Now on Zetia. WelChol would be reasonable. She is somewhat hesitant to start any medications due to problems with cholesterol lowering medicines she's had in the past. We'll have our pharmD review her lipids and give a recommendation.  Could consider for PCSK 9 inhibitor trial.  We will wait total after her next lipid check in 5/15 to decide.  Dyspnea on exertion: This occurred while walking up an incline. She does not typically walk on uneven surfaces. She is okay walking on flat surfaces. I think it is likely deconditioning. There are no overt signs of heart  failure.  Continue reguar exercise.    Tremor better after decreased synthroid.    Signed, Mina Marble, MD, [facility] 01/31/2014 1:39 PM

## 2014-01-31 NOTE — Patient Instructions (Signed)
Your physician recommends that you continue on your current medications as directed. Please refer to the Current Medication list given to you today.  Your physician wants you to follow-up in: 1 year with Dr. Varanasi. You will receive a reminder letter in the mail two months in advance. If you don't receive a letter, please call our office to schedule the follow-up appointment.  

## 2014-02-13 ENCOUNTER — Other Ambulatory Visit: Payer: Self-pay

## 2014-02-13 MED ORDER — PROPRANOLOL HCL ER 80 MG PO CP24: ORAL_CAPSULE | ORAL | Status: DC

## 2014-02-19 ENCOUNTER — Other Ambulatory Visit (INDEPENDENT_AMBULATORY_CARE_PROVIDER_SITE_OTHER): Payer: Medicare Other

## 2014-02-19 DIAGNOSIS — E782 Mixed hyperlipidemia: Secondary | ICD-10-CM

## 2014-02-19 LAB — HEPATIC FUNCTION PANEL
ALK PHOS: 39 U/L (ref 39–117)
ALT: 20 U/L (ref 0–35)
AST: 20 U/L (ref 0–37)
Albumin: 3.9 g/dL (ref 3.5–5.2)
BILIRUBIN DIRECT: 0 mg/dL (ref 0.0–0.3)
TOTAL PROTEIN: 7.1 g/dL (ref 6.0–8.3)
Total Bilirubin: 0.8 mg/dL (ref 0.2–1.2)

## 2014-02-19 LAB — LIPID PANEL
CHOLESTEROL: 209 mg/dL — AB (ref 0–200)
HDL: 64.1 mg/dL (ref >39.00)
LDL Cholesterol: 129 mg/dL — ABNORMAL HIGH (ref 0–99)
Total CHOL/HDL Ratio: 3
Triglycerides: 81 mg/dL (ref 0.0–149.0)
VLDL: 16.2 mg/dL (ref 0.0–40.0)

## 2014-02-28 NOTE — Progress Notes (Signed)
No h/o CABG

## 2014-06-13 ENCOUNTER — Other Ambulatory Visit: Payer: Self-pay

## 2014-06-13 MED ORDER — HYDROCHLOROTHIAZIDE 25 MG PO TABS: 25.0000 mg | ORAL_TABLET | Freq: Every day | ORAL | Status: DC

## 2014-06-13 MED ORDER — LISINOPRIL 40 MG PO TABS: 40.0000 mg | ORAL_TABLET | Freq: Every day | ORAL | Status: DC

## 2014-07-19 ENCOUNTER — Other Ambulatory Visit: Payer: Self-pay | Admitting: Obstetrics and Gynecology

## 2014-07-20 LAB — CYTOLOGY - PAP

## 2014-09-22 ENCOUNTER — Encounter: Payer: Self-pay | Admitting: *Deleted

## 2015-01-07 ENCOUNTER — Other Ambulatory Visit: Payer: Self-pay | Admitting: *Deleted

## 2015-01-07 MED ORDER — HYDROCHLOROTHIAZIDE 25 MG PO TABS: 25.0000 mg | ORAL_TABLET | Freq: Every day | ORAL | Status: DC

## 2015-01-07 MED ORDER — LISINOPRIL 40 MG PO TABS: 40.0000 mg | ORAL_TABLET | Freq: Every day | ORAL | Status: DC

## 2015-01-07 MED ORDER — PROPRANOLOL HCL ER 80 MG PO CP24: ORAL_CAPSULE | ORAL | Status: DC

## 2015-02-05 ENCOUNTER — Other Ambulatory Visit: Payer: Self-pay | Admitting: *Deleted

## 2015-02-05 MED ORDER — PROPRANOLOL HCL ER 80 MG PO CP24: ORAL_CAPSULE | ORAL | Status: DC

## 2015-02-05 MED ORDER — HYDROCHLOROTHIAZIDE 25 MG PO TABS: 25.0000 mg | ORAL_TABLET | Freq: Every day | ORAL | Status: DC

## 2015-02-05 MED ORDER — LISINOPRIL 40 MG PO TABS: 40.0000 mg | ORAL_TABLET | Freq: Every day | ORAL | Status: DC

## 2015-02-20 ENCOUNTER — Ambulatory Visit (INDEPENDENT_AMBULATORY_CARE_PROVIDER_SITE_OTHER): Payer: Medicare Other | Admitting: Interventional Cardiology

## 2015-02-20 ENCOUNTER — Encounter: Payer: Self-pay | Admitting: Interventional Cardiology

## 2015-02-20 VITALS — BP 170/82 | HR 57 | Ht 59.0 in | Wt 152.8 lb

## 2015-02-20 DIAGNOSIS — I10 Essential (primary) hypertension: Secondary | ICD-10-CM | POA: Diagnosis not present

## 2015-02-20 DIAGNOSIS — R0789 Other chest pain: Secondary | ICD-10-CM

## 2015-02-20 DIAGNOSIS — E782 Mixed hyperlipidemia: Secondary | ICD-10-CM

## 2015-02-20 NOTE — Progress Notes (Signed)
Patient ID: Donna Guerrero, female   DOB: Sep 08, 1941, 74 y.o.   MRN: [id number]     Cardiology Office Note   Date:  02/20/2015   ID:  Donna Guerrero, DOB 1941-05-22, MRN [id number]  PCP:  Gerrit Heck, MD    No chief complaint on file.    Wt Readings from Last 3 Encounters:  02/20/15 152 lb 12.8 oz (69.31 kg)  01/31/14 151 lb 6.4 oz (68.675 kg)  11/14/13 150 lb (68.04 kg)       History of Present Illness: Donna Guerrero is a 74 y.o. female  who has had HTN. Propranolol was switched to Metoprolol. She has been on simvastatin and atorvastatin but had side effects from both. Crestor was started. She is now off of the crestor. Hbg A1C was elevated so Amaryl was started. Amaryl was stopped as well. Metformin was started. BP has been high in the doctors office. At home, BP is 105-149/70 range. She had muscle pains with atorvastatin. She feels better off of the statins. She stopped Zetia. She has  been checking her BP as regularly with readings in the 75--643 range systolic. Marland Kitchen Hypertension:  She was doing Zumba for seniors twice a week. She walks in the gym as well. No symptoms. Denies : Chest pain.  Dizziness.  Palpitations.  Syncope.   In 9/14, she tried to taper her propranolol. She had some shakiness and withdrawal sx. Tolerating the beta blocker. SHe had some SHOB walking up an incline. SHe typically walks on flat surfaces. Thyroid meds to be adjusted. She has ben intolerant of multiple statins due to muscle pain and headaches. Dr. Buddy Duty recommended welchol. She has been under a lot of stress at home due to some family issues. She thinks her blood pressure was increased because of this as well.  Since last visit, stress level is better. BP is much better as well. She is continuing her propranolol.   She will be leaving her husband in the next few weks due to the stress of their relationship.    Past Medical History  Diagnosis Date  . 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 Hypertension   . Thyroid disease     HYPOTHYROIDISM  . Essential tremor   . Allergic rhinitis   . Rosacea   . Whooping cough   . 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 Hyperlipidemia     INTOLERANT OF STATINS    Past Surgical History  Procedure Laterality Date  . Retinal detachment surgery    . Cataract extraction       Current Outpatient Prescriptions  Medication Sig Dispense Refill  . glucose blood test strip 1 each by Other route as needed for other. Use as instructed    . hydrochlorothiazide (HYDRODIURIL) 25 MG tablet Take 1 tablet (25 mg total) by mouth daily. 30 tablet 11  . latanoprost (XALATAN) 0.005 % ophthalmic solution Place 1 drop into both eyes at bedtime.     Marland Kitchen levothyroxine (SYNTHROID, LEVOTHROID) 137 MCG tablet Take 112 mcg by mouth daily before breakfast. Reduced to 125 mcg    . lisinopril (PRINIVIL,ZESTRIL) 40 MG tablet Take 1 tablet (40 mg total) by mouth daily. 30 tablet 11  . naproxen sodium (ANAPROX) 220 MG tablet Take 220 mg by mouth 2 (two) times daily as needed ((KNEE PAIN)).     Marland Kitchen potassium chloride SA (K-DUR,KLOR-CON) 20 MEQ tablet Take 1 tablet (20 mEq total) by mouth daily. 30 tablet 6  .  propranolol ER (INDERAL LA) 80 MG 24 hr capsule take 1 capsule by mouth once daily 30 capsule 11   No current facility-administered medications for this visit.    Allergies:   Amaryl; Atorvastatin; Codeine; Crestor; Simvastatin; and Welchol    Social History:  The patient  reports that she has never smoked. She has never used smokeless tobacco. She reports that she drinks about 1.2 oz of alcohol per week. She reports that she does not use illicit drugs.   Family History:  The patient's family history includes CAD in her father; COPD in her brother; Heart disease in her mother.    ROS:  Please see the history of present illness.   Otherwise, review of systems are positive for stress, DOE and chest tightness.   All other systems are reviewed and  negative.    PHYSICAL EXAM: VS:  BP 170/82 mmHg  Pulse 57  Ht 4\' 11"  (1.499 m)  Wt 152 lb 12.8 oz (69.31 kg)  BMI 30.85 kg/m2 , BMI Body mass index is 30.85 kg/(m^2). GEN: Well nourished, well developed, in no acute distress HEENT: normal Neck: no JVD, carotid bruits, or masses Cardiac: RRR; no murmurs, rubs, or gallops,no edema  Respiratory:  clear to auscultation bilaterally, normal work of breathing GI: soft, nontender, nondistended, + BS MS: no deformity or atrophy Skin: warm and dry, no rash Neuro:  Strength and sensation are intact Psych: euthymic mood, full affect   EKG:   The ekg ordered today demonstrates NSR, no ST segment changes   Recent Labs: No results found for requested labs within last 365 days.   Lipid Panel    Component Value Date/Time   CHOL 209* 02/19/2014 0925   TRIG 81.0 02/19/2014 0925   HDL 64.10 02/19/2014 0925   CHOLHDL 3 02/19/2014 0925   VLDL 16.2 02/19/2014 0925   LDLCALC 129* 02/19/2014 0925     Other studies Reviewed: Additional studies/ records that were reviewed today with results demonstrating: .   ASSESSMENT AND PLAN:  Benign hypertension  stopped Lisinopril-Hydrochlorothiazide Tablet, 20-25 MG, 1 tablet, once a day Not controlled today. Readings at home have been better, systolics around 182. She has had a lot of stress at home of late, but this is better. She is planing on leavingher husband. She should have a systolic BP less than 993 given increased blood sugar. She should check her BP at home. Diastolic should be less than 80.   Increased lisinopril to 40 mg a day. Continue HCTZ 25 mg daily.  Home readings are controlled. Continue current meds.   Mixed hyperlipidemia  She stopped Zetia Tablet, 10 MG, 1 tablet, Orally, Once a day LDL target < 100. Did not tolerate Lipitor or Zocor. Not on Zetia. WelChol would be reasonable. She is somewhat hesitant to start any medications due to problems with cholesterol lowering  medicines she's had in the past.  LDL reasonable on Zetia.  Obtain lab results from Dr. Drema Dallas. She stopped the Zetia on her unknown for reasons that are unclear.  Further decisions based on her lipids off of Zetia.  Dyspnea on exertion/chest tightness: This occurred while walking up an incline. She does not typically walk on uneven surfaces. She is okay walking on flat surfaces. Stress test would be reasonable but she does not want to do a stress test. There are no overt signs of heart failure. Continue reguar exercise.After more discussion, she agreed to a ETT due to her revealing more episodes of tightness/ SHOB with activity.  SHe did not want a test with an IV.  Need to evaluate for ischemia.   Tremor better after decreased synthroid.    Current medicines are reviewed at length with the patient today.  The patient concerns regarding her medicines were addressed.  The following changes have been made:  No change  Labs/ tests ordered today include: ETT No orders of the defined types were placed in this encounter.    Recommend 150 minutes/week of aerobic exercise Low fat, low carb, high fiber diet recommended  Disposition:   FU in for stress test   Teresita Madura., MD  02/20/2015 11:59 AM    [facility] [place], [place], [place]  [address] Phone: [phone]; Fax: [phone]

## 2015-02-20 NOTE — Patient Instructions (Signed)
Medication Instructions:  Your physician recommends that you continue on your current medications as directed. Please refer to the Current Medication list given to you today.   Labwork: NONE  Testing/Procedures: Your physician has requested that you have an exercise tolerance test. For further information please visit [email or web address]. Please also follow instruction sheet, as given.   Follow-Up: Your physician wants you to follow-up in: 12 months with Dr. Irish Lack. You will receive a reminder letter in the mail two months in advance. If you don't receive a letter, please call our office to schedule the follow-up appointment.   Any Other Special Instructions Will Be Listed Below (If Applicable).

## 2015-03-25 ENCOUNTER — Other Ambulatory Visit: Payer: Self-pay | Admitting: Interventional Cardiology

## 2015-03-25 ENCOUNTER — Telehealth: Payer: Self-pay | Admitting: Physician Assistant

## 2015-03-25 ENCOUNTER — Encounter: Payer: Medicare Other | Admitting: Physician Assistant

## 2015-03-25 ENCOUNTER — Ambulatory Visit: Payer: Medicare Other | Admitting: Physician Assistant

## 2015-03-25 ENCOUNTER — Ambulatory Visit (INDEPENDENT_AMBULATORY_CARE_PROVIDER_SITE_OTHER): Payer: Medicare Other

## 2015-03-25 ENCOUNTER — Other Ambulatory Visit: Payer: Self-pay | Admitting: Physician Assistant

## 2015-03-25 DIAGNOSIS — R0602 Shortness of breath: Secondary | ICD-10-CM

## 2015-03-25 DIAGNOSIS — R0789 Other chest pain: Secondary | ICD-10-CM

## 2015-03-25 DIAGNOSIS — I10 Essential (primary) hypertension: Secondary | ICD-10-CM | POA: Diagnosis not present

## 2015-03-25 DIAGNOSIS — R9439 Abnormal result of other cardiovascular function study: Secondary | ICD-10-CM

## 2015-03-25 LAB — EXERCISE TOLERANCE TEST
CSEPEDS: 30 s
CSEPEW: 6.4 METS
CSEPHR: 93 %
CSEPPHR: 137 {beats}/min
Exercise duration (min): 4 min
MPHR: 147 {beats}/min
RPE: 15
Rest HR: 76 {beats}/min

## 2015-03-25 NOTE — Telephone Encounter (Signed)
ETT done today with marked ST depression during exercise. No chest pain. Discussed proceeding with ETT-Myoview but the patient declined. She would like to discuss further with Dr. Casandra Doffing before proceeding with any other testing. Will have Dr. Irish Lack review with the patient. I spoke with him today. Richardson Dopp, PA-C   03/25/2015 11:13 AM

## 2015-03-29 NOTE — Telephone Encounter (Signed)
Called on 6/23 and left a message.

## 2015-04-03 ENCOUNTER — Telehealth: Payer: Self-pay | Admitting: Interventional Cardiology

## 2015-04-03 DIAGNOSIS — R9439 Abnormal result of other cardiovascular function study: Secondary | ICD-10-CM

## 2015-04-03 NOTE — Telephone Encounter (Signed)
Will route to Dr. Irish Lack. Cell number is [phone]

## 2015-04-03 NOTE — Telephone Encounter (Signed)
Follow UP  Pt wanted to make sure Rn has mobile # to use since other numbers are not working.

## 2015-04-03 NOTE — Telephone Encounter (Signed)
Will route this to Dr Hassell Done nurse in regards to this message.

## 2015-04-03 NOTE — Telephone Encounter (Signed)
Follow Up    Pt states her phone hasnt been working and requesting calls on her cell phone. Following up on most recent results.

## 2015-04-04 NOTE — Telephone Encounter (Signed)
Spoke with pt and she states that she has been having some phone trouble and her voicemails were delayed. Pt states that she had missed some phone calls from Dr. Irish Lack and wanted to provide our office with her cell number so that Dr. Irish Lack could call her back. Pt's cell number is [phone]. Pt states that she knows that Dr. Irish Lack is calling to speak with her in regards to her stress test and she would like to speak with him directly. Informed pt that I would route this information to Dr. Irish Lack. Pt in agreement.

## 2015-04-04 NOTE — Telephone Encounter (Signed)
Patient willing to have a nuclear stress test.  Please call her to set it up.  Exercise would be preferable.  Hold beta blocker.

## 2015-04-04 NOTE — Telephone Encounter (Signed)
Spoke with pt and went over instructions to nuclear stress test. Informed pt that someone from [facility] team would contact her to schedule. Pt verbalized understanding and was in agreement with this plan.

## 2015-04-04 NOTE — Telephone Encounter (Signed)
Please call back.    Pt returning this office call for stress test results and lab results.   Please give her a call back.

## 2015-04-10 ENCOUNTER — Telehealth (HOSPITAL_COMMUNITY): Payer: Self-pay | Admitting: *Deleted

## 2015-04-10 NOTE — Telephone Encounter (Signed)
Patient given detailed instructions per Myocardial Perfusion Study Information Sheet for test on 04/10/15 at 815. Patient Notified to arrive 15 minutes early, and that it is imperative to arrive on time for appointment to keep from having the test rescheduled. Patient verbalized understanding. Cresenciano Lick ,RN

## 2015-04-12 ENCOUNTER — Ambulatory Visit (HOSPITAL_COMMUNITY): Payer: Medicare Other | Attending: Cardiovascular Disease

## 2015-04-12 DIAGNOSIS — R9439 Abnormal result of other cardiovascular function study: Secondary | ICD-10-CM

## 2015-04-12 DIAGNOSIS — I10 Essential (primary) hypertension: Secondary | ICD-10-CM | POA: Diagnosis not present

## 2015-04-12 DIAGNOSIS — R0609 Other forms of dyspnea: Secondary | ICD-10-CM | POA: Insufficient documentation

## 2015-04-12 DIAGNOSIS — R079 Chest pain, unspecified: Secondary | ICD-10-CM | POA: Insufficient documentation

## 2015-04-12 DIAGNOSIS — E119 Type 2 diabetes mellitus without complications: Secondary | ICD-10-CM | POA: Diagnosis not present

## 2015-04-12 LAB — MYOCARDIAL PERFUSION IMAGING
CHL CUP NUCLEAR SDS: 2
CHL CUP NUCLEAR SRS: 4
LV dias vol: 67 mL
LV sys vol: 20 mL
Peak HR: 108 {beats}/min
RATE: 0.3
Rest HR: 68 {beats}/min
SSS: 6
TID: 1.1

## 2015-04-12 MED ORDER — TECHNETIUM TC 99M SESTAMIBI GENERIC - CARDIOLITE
10.1000 | Freq: Once | INTRAVENOUS | Status: AC | PRN
  Administered 2015-04-12: 10.1 via INTRAVENOUS

## 2015-04-12 MED ORDER — REGADENOSON 0.4 MG/5ML IV SOLN
0.4000 mg | Freq: Once | INTRAVENOUS | Status: AC
  Administered 2015-04-12: 0.4 mg via INTRAVENOUS

## 2015-04-12 MED ORDER — TECHNETIUM TC 99M SESTAMIBI GENERIC - CARDIOLITE
32.4000 | Freq: Once | INTRAVENOUS | Status: AC | PRN
  Administered 2015-04-12: 32 via INTRAVENOUS

## 2015-04-17 ENCOUNTER — Telehealth: Payer: Self-pay | Admitting: Interventional Cardiology

## 2015-04-17 NOTE — Telephone Encounter (Signed)
New message ° ° °Patient calling for stress test results °

## 2015-04-17 NOTE — Telephone Encounter (Signed)
Informed pt of results. Informed pt to check BP and let us know if readings consistently over 150/90 or if she still has sx of chest discomfort. Pt verbalized understanding and was in agreement with this plan.

## 2016-01-28 ENCOUNTER — Other Ambulatory Visit: Payer: Self-pay | Admitting: Interventional Cardiology

## 2016-02-12 ENCOUNTER — Other Ambulatory Visit: Payer: Self-pay | Admitting: Ophthalmology

## 2016-02-25 ENCOUNTER — Encounter: Payer: Self-pay | Admitting: Interventional Cardiology

## 2016-02-25 ENCOUNTER — Ambulatory Visit (INDEPENDENT_AMBULATORY_CARE_PROVIDER_SITE_OTHER): Payer: Medicare Other | Admitting: Interventional Cardiology

## 2016-02-25 VITALS — BP 118/70 | HR 60 | Ht 60.0 in | Wt 152.0 lb

## 2016-02-25 DIAGNOSIS — E119 Type 2 diabetes mellitus without complications: Secondary | ICD-10-CM | POA: Diagnosis not present

## 2016-02-25 DIAGNOSIS — R9439 Abnormal result of other cardiovascular function study: Secondary | ICD-10-CM | POA: Diagnosis not present

## 2016-02-25 DIAGNOSIS — I10 Essential (primary) hypertension: Secondary | ICD-10-CM | POA: Diagnosis not present

## 2016-02-25 DIAGNOSIS — E785 Hyperlipidemia, unspecified: Secondary | ICD-10-CM

## 2016-02-25 NOTE — Patient Instructions (Signed)

## 2016-02-25 NOTE — Progress Notes (Signed)
Patient ID: Donna Guerrero, female   DOB: Feb 05, 1941, 75 y.o.   MRN: [id number]     Cardiology Office Note   Date:  02/25/2016   ID:  Donna Guerrero, DOB 12/01/1940, MRN [id number]  PCP:  Gerrit Heck, MD    No chief complaint on file. HTN   Wt Readings from Last 3 Encounters:  02/25/16 152 lb (68.947 kg)  02/20/15 152 lb 12.8 oz (69.31 kg)  01/31/14 151 lb 6.4 oz (68.675 kg)       History of Present Illness: Donna Guerrero is a 75 y.o. female  Who has had HTN, high cholesterol and DM.  She has had difficulty tolerating medicines.   She has been on simvastatin and atorvastatin but had side effects from both. Crestor was started. She is now off of the crestor.  SHe is trying to change diet to improve cholesterol.   Hbg A1C was elevated so Amaryl was started. Amaryl was stopped as well. Metformin was started.  THis was stopped.  Januvia was tried as well without success.  She is changing her diet.  DOE and chest tightness has resolved.  Headaches better now that blood sugar is improved.   She is not walking enough.  She does a lot of work around the house.  She goes up stairs without any chest tightness.  SHe has some mild DOE with that.       Past Medical History  Diagnosis Date  . Diabetes mellitus without complication ([place])     TYPE II  . Hypertension   . Thyroid disease     HYPOTHYROIDISM  . Essential tremor   . Allergic rhinitis   . Rosacea   . Whooping cough   . 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 Hyperlipidemia     INTOLERANT OF STATINS    Past Surgical History  Procedure Laterality Date  . Retinal detachment surgery    . Cataract extraction       Current Outpatient Prescriptions  Medication Sig Dispense Refill  . dorzolamide (TRUSOPT) 2 % ophthalmic solution Place 1 drop into the left eye 2 (two) times daily.    . dorzolamide-timolol (COSOPT) 22.3-6.8 MG/ML ophthalmic solution Place 1 drop into the left eye 2 (two) times daily.     Marland Kitchen glucose blood test  strip 1 each by Other route as needed for other. Use as instructed    . hydrochlorothiazide (HYDRODIURIL) 25 MG tablet take 1 tablet by mouth once daily 30 tablet 3  . latanoprost (XALATAN) 0.005 % ophthalmic solution Place 1 drop into both eyes at bedtime.     Marland Kitchen levothyroxine (SYNTHROID, LEVOTHROID) 137 MCG tablet Take 112 mcg by mouth daily before breakfast. Reduced to 125 mcg    . lisinopril (PRINIVIL,ZESTRIL) 40 MG tablet take 1 tablet by mouth once daily 30 tablet 3  . naproxen sodium (ANAPROX) 220 MG tablet Take 220 mg by mouth 2 (two) times daily as needed ((KNEE PAIN)).     Marland Kitchen propranolol ER (INDERAL LA) 80 MG 24 hr capsule take 1 capsule by mouth once daily 30 capsule 3  . potassium chloride SA (K-DUR,KLOR-CON) 20 MEQ tablet Take 1 tablet (20 mEq total) by mouth daily. (Patient not taking: Reported on 02/25/2016) 30 tablet 6  . SYNTHROID 112 MCG tablet      No current facility-administered medications for this visit.    Allergies:   Welchol; Amaryl; Atorvastatin; Codeine; Crestor; and Simvastatin    Social History:  The patient  reports that she has  never smoked. She has never used smokeless tobacco. She reports that she drinks about 1.2 oz of alcohol per week. She reports that she does not use illicit drugs.   Family History:  The patient's family history includes CAD in her father; COPD in her brother; Heart attack in her father; Heart disease in her mother. There is no history of Hypertension or Stroke.    ROS:  Please see the history of present illness.   Otherwise, review of systems are positive for reduced stress.   All other systems are reviewed and negative.    PHYSICAL EXAM: VS:  BP 118/70 mmHg  Pulse 60  Ht 5' (1.524 m)  Wt 152 lb (68.947 kg)  BMI 29.69 kg/m2 , BMI Body mass index is 29.69 kg/(m^2). GEN: Well nourished, well developed, in no acute distress HEENT: normal Neck: no JVD, carotid bruits, or masses Cardiac: RRR; no murmurs, rubs, or gallops,no edema    Respiratory:  clear to auscultation bilaterally, normal work of breathing GI: soft, nontender, nondistended, + BS MS: no deformity or atrophy Skin: warm and dry, no rash Neuro:  Strength and sensation are intact Psych: euthymic mood, full affect   EKG:   The ekg ordered today demonstrates sinus bradycardia, nonspecific ST segment changes   Recent Labs: No results found for requested labs within last 365 days.   Lipid Panel    Component Value Date/Time   CHOL 209* 02/19/2014 0925   TRIG 81.0 02/19/2014 0925   HDL 64.10 02/19/2014 0925   CHOLHDL 3 02/19/2014 0925   VLDL 16.2 02/19/2014 0925   LDLCALC 129* 02/19/2014 0925     Other studies Reviewed: Additional studies/ records that were reviewed today with results demonstrating: stress test as above.   ASSESSMENT AND PLAN:  1. HTN: Controlled on current meds.  SHe is handling her stress better.  Continue current meds.   2. DOE: Abnormal ETT. Normal nuclear stress test in 2016.  Abnormal ECG portion.   3. DM: Trying to control with lifestyle changes.  4. Hyperlipidemia:  Trying to manage with lifestyle changes.  Not taking a statin. COuld consider Crestor 10 mg once a week if she has LDL above target when checked with Dr. Buddy Duty.   Current medicines are reviewed at length with the patient today.  The patient concerns regarding her medicines were addressed.  The following changes have been made:  No change  Labs/ tests ordered today include:  Orders Placed This Encounter  Procedures  . EKG 12-Lead    Recommend 150 minutes/week of aerobic exercise Low fat, low carb, high fiber diet recommended  Disposition:   FU in 1 year   Signed, Larae Grooms, MD  02/25/2016 12:20 PM    [facility] [place], [place], [place]  [address] Phone: [phone]; Fax: [phone]

## 2016-03-13 ENCOUNTER — Ambulatory Visit: Payer: Medicare Other | Admitting: Skilled Nursing Facility1

## 2016-05-31 ENCOUNTER — Other Ambulatory Visit: Payer: Self-pay | Admitting: Interventional Cardiology

## 2017-02-08 ENCOUNTER — Encounter: Payer: Self-pay | Admitting: *Deleted

## 2017-02-23 NOTE — Progress Notes (Signed)
Patient ID: Donna Guerrero, female   DOB: 1941-02-17, 76 y.o.   MRN: [id number]     Cardiology Office Note   Date:  02/24/2017   ID:  Donna Guerrero, DOB 1941-07-08, MRN [id number]  PCP:  Leighton Ruff, MD    No chief complaint on file. HTN   Wt Readings from Last 3 Encounters:  02/24/17 148 lb 6.4 oz (67.3 kg)  02/25/16 152 lb (68.9 kg)  02/20/15 152 lb 12.8 oz (69.3 kg)       History of Present Illness: Donna Guerrero is a 76 y.o. female  Who has had HTN, high cholesterol and DM.  She has had difficulty tolerating medicines.   She has been on simvastatin and atorvastatin but had side effects from both. Crestor was started. She is now off of the crestor.  SHe is trying to change diet to improve cholesterol.   Hbg A1C was elevated so Amaryl was started. Amaryl was stopped as well. Metformin was started.  THis was stopped.  Januvia was tried as well without success.  She is changing her diet.   Since the last visit, she is having more issues with her eyes and requires several eye drops.  She has some dizziness associated with th edrops.  She has been taking Jardiance for her sugar.  She does not walk that much for exercise.  SHe has been very busy with her family.  She walks in the house and does housework.  Her daughter and 2 grandsons moved in with her.  Her husband has hada lot of medical appts as well.   Denies : Chest pain.  Leg edema. Nitroglycerin use. Orthopnea. Paroxysmal nocturnal dyspnea. Shortness of breath. Syncope.   SHe has some DOE with walking fast.  Resolves with rest quickly.  BP was ok at Dr. Cindra Eves office per her report.  SHe is not checking it at home.        Past Medical History:  Diagnosis Date  . Allergic rhinitis   . Diabetes mellitus without complication ([place])    TYPE II  . Essential tremor   . 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 Hyperlipidemia    INTOLERANT OF STATINS  . Hypertension   . Rosacea   . Thyroid disease    HYPOTHYROIDISM  . Whooping  cough     Past Surgical History:  Procedure Laterality Date  . CATARACT EXTRACTION    . RETINAL DETACHMENT SURGERY       Current Outpatient Prescriptions  Medication Sig Dispense Refill  . dorzolamide (TRUSOPT) 2 % ophthalmic solution Place 1 drop into the left eye 2 (two) times daily.    . dorzolamide-timolol (COSOPT) 22.3-6.8 MG/ML ophthalmic solution Place 1 drop into the left eye 2 (two) times daily.     Marland Kitchen glucose blood test strip 1 each by Other route as needed for other. Use as instructed    . hydrochlorothiazide (HYDRODIURIL) 25 MG tablet take 1 tablet by mouth once daily 30 tablet 8  . latanoprost (XALATAN) 0.005 % ophthalmic solution Place 1 drop into both eyes at bedtime.     Marland Kitchen levothyroxine (SYNTHROID, LEVOTHROID) 137 MCG tablet Take 112 mcg by mouth daily before breakfast. Reduced to 125 mcg    . lisinopril (PRINIVIL,ZESTRIL) 40 MG tablet take 1 tablet by mouth once daily 30 tablet 8  . naproxen sodium (ANAPROX) 220 MG tablet Take 220 mg by mouth 2 (two) times daily as needed ((KNEE PAIN)).     Marland Kitchen potassium chloride SA (K-DUR,KLOR-CON) 20  MEQ tablet Take 1 tablet (20 mEq total) by mouth daily. 30 tablet 6  . propranolol ER (INDERAL LA) 80 MG 24 hr capsule take 1 capsule by mouth once daily 30 capsule 8  . SYNTHROID 112 MCG tablet Take 112 mcg by mouth daily before breakfast.     . JARDIANCE 25 MG TABS tablet Take 25 mg by mouth daily.     No current facility-administered medications for this visit.     Allergies:   Welchol [colesevelam hcl]; Amaryl [glimepiride]; Atorvastatin; Codeine; Crestor [rosuvastatin]; and Simvastatin    Social History:  The patient  reports that she has never smoked. She has never used smokeless tobacco. She reports that she drinks about 1.2 oz of alcohol per week . She reports that she does not use drugs.   Family History:  The patient's family history includes CAD in her father; COPD in her brother; Heart attack in her father; Heart disease in  her mother.    ROS:  Please see the history of present illness.   Otherwise, review of systems are positive for reduced stress.   All other systems are reviewed and negative.    PHYSICAL EXAM: VS:  BP (!) 170/90 (BP Location: Right Arm, Patient Position: Sitting, Cuff Size: Normal)   Pulse 61   Ht 5' (1.524 m)   Wt 148 lb 6.4 oz (67.3 kg)   SpO2 96%   BMI 28.98 kg/m  , BMI Body mass index is 28.98 kg/m. GEN: Well nourished, well developed, in no acute distress  HEENT: normal  Neck: no JVD, carotid bruits, or masses Cardiac: RRR; no murmurs, rubs, or gallops,no edema ; 2+ DP pulses Respiratory:  clear to auscultation bilaterally, normal work of breathing GI: soft, nontender, nondistended,  MS: no deformity or atrophy  Skin: warm and dry, no rash Neuro:  Strength and sensation are intact Psych: euthymic mood, full affect    EKG:   The ekg ordered today demonstrates NSR, NSST   Recent Labs: No results found for requested labs within last 8760 hours.   Lipid Panel    Component Value Date/Time   CHOL 209 (H) 02/19/2014 0925   TRIG 81.0 02/19/2014 0925   HDL 64.10 02/19/2014 0925   CHOLHDL 3 02/19/2014 0925   VLDL 16.2 02/19/2014 0925   LDLCALC 129 (H) 02/19/2014 0925     Other studies Reviewed: Additional studies/ records that were reviewed today with results demonstrating: stress test as above.   ASSESSMENT AND PLAN:  1. HTN: Controlled on current meds.  TOlerating current meds.  BP high today.  Check at home and let us know if it is high, > 150/90.  Would consider adding amlodipine.   2. DOE: Abnormal ECG portion of exercise test. Normal nuclear stress test in 2016.  Abnormal ECG portion. Increase exercise to improve stamina. Sx stable at this time. She has declined cath in the past. 3. DM: Managed by Dr. Buddy Duty.  Tolerating meds at this time which is a relief to her. 4. Hyperlipidemia:  Trying to manage with lifestyle changes- decreasing carbs.  Eating eggs in the  AM.  She feels they are good for her.  Not taking a statin due to prior side effects. Crestor 10 mg once a week might be sufficienct.  She does not want to take a statin regardless.     Current medicines are reviewed at length with the patient today.  The patient concerns regarding her medicines were addressed.  The following changes have been made:  No change  Labs/ tests ordered today include:  No orders of the defined types were placed in this encounter.   Recommend 150 minutes/week of aerobic exercise Low fat, low carb, high fiber diet recommended  Disposition:   FU in 1 year   Signed, Larae Grooms, MD  02/24/2017 11:36 AM    [facility] [place], [place], [place]  [address] Phone: [phone]; Fax: [phone]

## 2017-02-24 ENCOUNTER — Ambulatory Visit (INDEPENDENT_AMBULATORY_CARE_PROVIDER_SITE_OTHER): Payer: Medicare Other | Admitting: Interventional Cardiology

## 2017-02-24 ENCOUNTER — Encounter: Payer: Self-pay | Admitting: Interventional Cardiology

## 2017-02-24 VITALS — BP 170/90 | HR 61 | Ht 60.0 in | Wt 148.4 lb

## 2017-02-24 DIAGNOSIS — I10 Essential (primary) hypertension: Secondary | ICD-10-CM

## 2017-02-24 DIAGNOSIS — E782 Mixed hyperlipidemia: Secondary | ICD-10-CM | POA: Diagnosis not present

## 2017-02-24 NOTE — Patient Instructions (Signed)
Medication Instructions:  Your physician recommends that you continue on your current medications as directed. Please refer to the Current Medication list given to you today.   Labwork: None ordered.  Testing/Procedures: None ordered.  Follow-Up: Your physician wants you to follow-up in: 1 year with Dr. Irish Lack. You will receive a reminder letter in the mail two months in advance. If you don't receive a letter, please call our office to schedule the follow-up appointment.   Any Other Special Instructions Will Be Listed Below (If Applicable).   If your BP consistently stays greater than 150/90, then please call our office and let us know.  If you need a refill on your cardiac medications before your next appointment, please call your pharmacy.

## 2017-02-25 ENCOUNTER — Other Ambulatory Visit: Payer: Self-pay | Admitting: Interventional Cardiology

## 2017-03-17 ENCOUNTER — Other Ambulatory Visit: Payer: Self-pay | Admitting: Interventional Cardiology

## 2017-03-17 MED ORDER — POTASSIUM CHLORIDE CRYS ER 20 MEQ PO TBCR: 20.0000 meq | EXTENDED_RELEASE_TABLET | Freq: Every day | ORAL | 11 refills | Status: DC

## 2017-03-19 ENCOUNTER — Ambulatory Visit (INDEPENDENT_AMBULATORY_CARE_PROVIDER_SITE_OTHER): Payer: Medicare Other | Admitting: Interventional Cardiology

## 2017-03-19 VITALS — BP 118/60 | HR 64 | Ht 60.0 in | Wt 145.4 lb

## 2017-03-19 DIAGNOSIS — I10 Essential (primary) hypertension: Secondary | ICD-10-CM

## 2017-03-19 NOTE — Progress Notes (Signed)
Pt's husband had  appt this am and pt mentioned that b/p was elevated last night per Dr Hassell Done last office note pt to call office if  B/p runs consistently above 150/90 to call office. Per pt may purchase new b/p cuff  At home not sure if working properly.  Also pt stated [facility] and [facility]  B/p later today ./cy

## 2017-04-12 ENCOUNTER — Telehealth: Payer: Self-pay | Admitting: Interventional Cardiology

## 2017-04-12 NOTE — Telephone Encounter (Signed)
No further cardiac testing needed before test.

## 2017-04-12 NOTE — Telephone Encounter (Signed)
Patient made aware that Dr. Irish Lack is okay with her proceeding with her procedure and that she does not require any further cardiac testing. Patient verbalized understanding and thanked me for the call.

## 2017-04-12 NOTE — Telephone Encounter (Signed)
New Message     Pt wants Dr. Irish Lack to know that she is being scheduled for a test by another dr for her eyes called Fluorescein Angiography. She is worried about it because she takes a beta blocker and she has some concerns. Test is Wednesday mornig.

## 2017-04-12 NOTE — Telephone Encounter (Signed)
Patient calling and states that she is scheduled for a Fluorescein Angiogram on Wednesday with Dr. Zigmund Daniel. She is worried about having this test. Patient states that she has been reading and she said that she found that the test could cause a heart attack and she wants to get Dr. Hassell Done approval. I explained to the patient that usually cardiac clearance is requested if the provider felt it necessary and we have not received anything. Patient verbalized understanding but would still like to have the okay from Dr. Irish Lack.

## 2017-04-14 ENCOUNTER — Encounter (INDEPENDENT_AMBULATORY_CARE_PROVIDER_SITE_OTHER): Payer: Medicare Other | Admitting: Ophthalmology

## 2017-04-14 DIAGNOSIS — E11319 Type 2 diabetes mellitus with unspecified diabetic retinopathy without macular edema: Secondary | ICD-10-CM

## 2017-04-14 DIAGNOSIS — I10 Essential (primary) hypertension: Secondary | ICD-10-CM

## 2017-04-14 DIAGNOSIS — E113292 Type 2 diabetes mellitus with mild nonproliferative diabetic retinopathy without macular edema, left eye: Secondary | ICD-10-CM | POA: Diagnosis not present

## 2017-04-14 DIAGNOSIS — E113391 Type 2 diabetes mellitus with moderate nonproliferative diabetic retinopathy without macular edema, right eye: Secondary | ICD-10-CM

## 2017-04-14 DIAGNOSIS — H338 Other retinal detachments: Secondary | ICD-10-CM

## 2017-04-14 DIAGNOSIS — H43813 Vitreous degeneration, bilateral: Secondary | ICD-10-CM

## 2017-04-14 DIAGNOSIS — H35033 Hypertensive retinopathy, bilateral: Secondary | ICD-10-CM

## 2017-08-31 ENCOUNTER — Other Ambulatory Visit: Payer: Self-pay | Admitting: *Deleted

## 2017-08-31 MED ORDER — LISINOPRIL 40 MG PO TABS: 40.0000 mg | ORAL_TABLET | Freq: Every day | ORAL | 1 refills | Status: DC

## 2018-02-24 ENCOUNTER — Other Ambulatory Visit: Payer: Self-pay | Admitting: Interventional Cardiology

## 2018-03-18 ENCOUNTER — Encounter: Payer: Self-pay | Admitting: Physician Assistant

## 2018-03-23 ENCOUNTER — Other Ambulatory Visit: Payer: Self-pay | Admitting: Interventional Cardiology

## 2018-03-30 ENCOUNTER — Encounter: Payer: Self-pay | Admitting: Physician Assistant

## 2018-03-30 NOTE — Progress Notes (Addendum)
Cardiology Office Note    Date:  03/31/2018  ID:  Donna Guerrero, DOB 04-11-1941, MRN [id number] PCP:  Leighton Ruff, MD  Cardiologist:  Larae Grooms, MD   Chief Complaint: f/u BP  History of Present Illness:  Donna Guerrero is a 77 y.o. female with history of HTN, hyperlipidemia, diabetes, hypothyroidism, roascea, essential tremor, glaucoma who presents for routine follow-up. She has followed with Dr. Irish Lack in the past for risk factor management and prior dyspnea on exertion. Prior stress test in 2016 showed abnormal EKG portion (Lexiscan) but normal perfusion; she has declined cath in the past. She has declined statinn use. Last labs 10/2017 showed Tchol 226, LDL 133, Trig 82, A1C 6.7, K 3.8, Cr 0.7, no recent Hgb; 03/2018 TSH wnl.  She returns for follow-up today and her blood pressure is high. We had a lengthy discussion about all her current stressors. She is primary caregiver for her husband who is on hospice with progressive vascular dementia. She has also been dealing with complications after eye surgery requiring frequent eye drops. Her son recently visited and wants her to sell the house. She does not have any financial stressors but certainly has a lot on her plate. She has not had any chest pain or new dyspnea. She relates some fatigue which is chronic which she's attributed to propranolol (takes for essential tremor). She does not follow BP at home but it has been quite variable in the past per records.    Past Medical History:  Diagnosis Date  . Allergic rhinitis   . Diabetes mellitus without complication ([place])    TYPE II  . Essential tremor   . 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 Hyperlipidemia    INTOLERANT OF STATINS  . Hypertension   . Hypothyroidism   . Rosacea   . Whooping cough     Past Surgical History:  Procedure Laterality Date  . CATARACT EXTRACTION    . RETINAL DETACHMENT SURGERY      Current Medications: Current Meds  Medication Sig  . dorzolamide  (TRUSOPT) 2 % ophthalmic solution Place 1 drop into the left eye 2 (two) times daily.  . dorzolamide-timolol (COSOPT) 22.3-6.8 MG/ML ophthalmic solution Place 1 drop into the left eye 2 (two) times daily.   Marland Kitchen glucose blood test strip 1 each by Other route as needed for other. Use as instructed  . hydrochlorothiazide (HYDRODIURIL) 25 MG tablet take 1 tablet by mouth once daily  . JARDIANCE 25 MG TABS tablet Take 25 mg by mouth daily.  Marland Kitchen latanoprost (XALATAN) 0.005 % ophthalmic solution Place 1 drop into both eyes at bedtime.   Marland Kitchen levothyroxine (SYNTHROID, LEVOTHROID) 137 MCG tablet Take 112 mcg by mouth daily before breakfast. Reduced to 125 mcg  . lisinopril (PRINIVIL,ZESTRIL) 40 MG tablet Take 1 tablet (40 mg total) by mouth daily.  . naproxen sodium (ANAPROX) 220 MG tablet Take 220 mg by mouth 2 (two) times daily as needed ((KNEE PAIN)).   Marland Kitchen potassium chloride SA (K-DUR,KLOR-CON) 20 MEQ tablet Take 1 tablet (20 mEq total) by mouth daily. Please keep upcoming appt for future refills. Thank you  . propranolol ER (INDERAL LA) 80 MG 24 hr capsule TAKE 1 CAPSULE BY MOUTH ONCE DAILY  . SYNTHROID 112 MCG tablet Take 112 mcg by mouth daily before breakfast.     Allergies:   Welchol [colesevelam hcl]; Amaryl [glimepiride]; Atorvastatin; Codeine; Crestor [rosuvastatin]; and Simvastatin   Social History   Socioeconomic History  . Marital status: Married  Spouse name: Not on file  . Number of children: Not on file  . Years of education: Not on file  . Highest education level: Not on file  Occupational History  . Not on file  Social Needs  . Financial resource strain: Not on file  . Food insecurity:    Worry: Not on file    Inability: Not on file  . Transportation needs:    Medical: Not on file    Non-medical: Not on file  Tobacco Use  . Smoking status: Never Smoker  . Smokeless tobacco: Never Used  Substance and Sexual Activity  . Alcohol use: Yes    Alcohol/week: 1.2 oz    Types: 2  Glasses of wine per week  . Drug use: No  . Sexual activity: Not on file  Lifestyle  . Physical activity:    Days per week: Not on file    Minutes per session: Not on file  . Stress: Not on file  Relationships  . Social connections:    Talks on phone: Not on file    Gets together: Not on file    Attends religious service: Not on file    Active member of club or organization: Not on file    Attends meetings of clubs or organizations: Not on file    Relationship status: Not on file  Other Topics Concern  . Not on file  Social History Narrative  . Not on file     Family History:  The patient's family history includes CAD in her father; COPD in her brother; Heart attack in her father; Heart disease in her mother. There is no history of Hypertension or Stroke.  ROS:   Please see the history of present illness. All other systems are reviewed and otherwise negative.    PHYSICAL EXAM:   VS:  BP (!) 170/80   Pulse 62   Ht 5' (1.524 m)   Wt 150 lb 12.8 oz (68.4 kg)   SpO2 98%   BMI 29.45 kg/m   BMI: Body mass index is 29.45 kg/m. Recheck other arm 180/90 by tech. Recheck by me 192/92 in R arm and similar in L arm. GEN: Well nourished, well developed WF, in no acute distress HEENT: normocephalic, atraumatic Neck: no JVD, carotid bruits, or masses Cardiac: RRR; no murmurs, rubs, or gallops, no edema  Respiratory:  clear to auscultation bilaterally, normal work of breathing GI: soft, nontender, nondistended, + BS MS: no deformity or atrophy Skin: warm and dry, no rash Neuro:  Alert and Oriented x 3, Strength and sensation are intact, follows commands Psych: euthymic mood, full affect  Wt Readings from Last 3 Encounters:  03/31/18 150 lb 12.8 oz (68.4 kg)  03/19/17 145 lb 6.4 oz (66 kg)  02/24/17 148 lb 6.4 oz (67.3 kg)      Studies/Labs Reviewed:   EKG:  EKG was ordered today and personally reviewed by me and demonstrates NSR 63bpm, minimal criteria for LVH, nonspecific  ST T changes. No significant change from prior.  Recent Labs: No results found for requested labs within last 8760 hours.   Lipid Panel    Component Value Date/Time   CHOL 209 (H) 02/19/2014 0925   TRIG 81.0 02/19/2014 0925   HDL 64.10 02/19/2014 0925   CHOLHDL 3 02/19/2014 0925   VLDL 16.2 02/19/2014 0925   LDLCALC 129 (H) 02/19/2014 0925    Additional studies/ records that were reviewed today include: Summarized above    ASSESSMENT & PLAN:  1. Essential HTN - variable on prior office visits, now running quite high - 192/92 on recheck by me which was similar in opposite arm. She denies any specific symptoms related to this. She did have a headache yesterday but it is gone. I do suspect some of this is situational but her blood pressure also remained quite high even with just resting quietly in the office visit. Dr. Irish Lack has suggested possible prior addition of amlodipine. She does report occasional trace ankle edema. Will change HCTZ to chlorthalidone 25mg  daily and add almodipine 5mg  daily. Recheck visit for BP in 1 week. Will check CMET, CBC, Mg today. If albumin is low might need to consider eval for proteinuria given her diabetes. If BP remains high despite med adjustment would need to consider renal artery duplex. She has no acute symptoms with her high BP today and EKG appears nonacute. She drinks an occasional Corona but no excessive EtOH. We did discuss cutting down sodium intake and limiting fluid to 64oz per day to facilitate diuretic action. She says her daughter questioned utility of a "nerve pill" - this might help. Since we do not typically follow controlleds in our office I've advised her to reach out to PCP for help. Apparently this is also a source of stress because she said she got a call from PCP's office informing her she might not be able to be a patient of Dr. Drema Dallas anymore because she hadn't seen her in over a year. Unfortunately this is all due to caring for her  husband. [facility] does have walk-in hours and I've encouraged her to try that avenue instead because I really feel this would help if she gets back on board with them. 2. Hyperlipidemia - followed by primary, last labs 10/2017 with LDL 133. Further per primary care. 3. Hypothyroidism - pt reports recent adjustment by endocrinology. It looks like TSH 03/28/18 was stable. 4. History of dyspnea - no significant recurrence.  Disposition: F/u with APP or HTN clinic in 1 week for recheck BP.   Medication Adjustments/Labs and Tests Ordered: Current medicines are reviewed at length with the patient today.  Concerns regarding medicines are outlined above. Medication changes, Labs and Tests ordered today are summarized above and listed in the Patient Instructions accessible in Encounters.   Signed, Charlie Pitter, PA-C  03/31/2018 9:25 AM    [facility] [place], [place], [place]  [address] Phone: [phone]; Fax: [phone]

## 2018-03-31 ENCOUNTER — Encounter: Payer: Self-pay | Admitting: Physician Assistant

## 2018-03-31 ENCOUNTER — Ambulatory Visit: Payer: Medicare Other | Admitting: Physician Assistant

## 2018-03-31 VITALS — BP 170/80 | HR 62 | Ht 60.0 in | Wt 150.8 lb

## 2018-03-31 DIAGNOSIS — E039 Hypothyroidism, unspecified: Secondary | ICD-10-CM

## 2018-03-31 DIAGNOSIS — Z8709 Personal history of other diseases of the respiratory system: Secondary | ICD-10-CM | POA: Diagnosis not present

## 2018-03-31 DIAGNOSIS — E785 Hyperlipidemia, unspecified: Secondary | ICD-10-CM

## 2018-03-31 DIAGNOSIS — I10 Essential (primary) hypertension: Secondary | ICD-10-CM

## 2018-03-31 DIAGNOSIS — R5383 Other fatigue: Secondary | ICD-10-CM

## 2018-03-31 DIAGNOSIS — Z87898 Personal history of other specified conditions: Secondary | ICD-10-CM

## 2018-03-31 LAB — COMPREHENSIVE METABOLIC PANEL
A/G RATIO: 1.7 (ref 1.2–2.2)
ALT: 16 IU/L (ref 0–32)
AST: 17 IU/L (ref 0–40)
Albumin: 4.6 g/dL (ref 3.5–4.8)
Alkaline Phosphatase: 56 IU/L (ref 39–117)
BUN/Creatinine Ratio: 19 (ref 12–28)
BUN: 14 mg/dL (ref 8–27)
Bilirubin Total: 0.6 mg/dL (ref 0.0–1.2)
CALCIUM: 9.4 mg/dL (ref 8.7–10.3)
CO2: 25 mmol/L (ref 20–29)
CREATININE: 0.73 mg/dL (ref 0.57–1.00)
Chloride: 103 mmol/L (ref 96–106)
GFR, EST AFRICAN AMERICAN: 93 mL/min/{1.73_m2} (ref >59)
GFR, EST NON AFRICAN AMERICAN: 80 mL/min/{1.73_m2} (ref >59)
GLOBULIN, TOTAL: 2.7 g/dL (ref 1.5–4.5)
Glucose: 121 mg/dL — ABNORMAL HIGH (ref 65–99)
POTASSIUM: 4.2 mmol/L (ref 3.5–5.2)
SODIUM: 141 mmol/L (ref 134–144)
TOTAL PROTEIN: 7.3 g/dL (ref 6.0–8.5)

## 2018-03-31 LAB — MAGNESIUM: Magnesium: 2 mg/dL (ref 1.6–2.3)

## 2018-03-31 LAB — CBC
HEMOGLOBIN: 13.2 g/dL (ref 11.1–15.9)
Hematocrit: 39.7 % (ref 34.0–46.6)
MCH: 30.8 pg (ref 26.6–33.0)
MCHC: 33.2 g/dL (ref 31.5–35.7)
MCV: 93 fL (ref 79–97)
Platelets: 214 10*3/uL (ref 150–450)
RBC: 4.28 x10E6/uL (ref 3.77–5.28)
RDW: 14.1 % (ref 12.3–15.4)
WBC: 4.8 10*3/uL (ref 3.4–10.8)

## 2018-03-31 MED ORDER — CHLORTHALIDONE 25 MG PO TABS: 25.0000 mg | ORAL_TABLET | Freq: Every day | ORAL | 3 refills | Status: DC

## 2018-03-31 MED ORDER — AMLODIPINE BESYLATE 5 MG PO TABS: 5.0000 mg | ORAL_TABLET | Freq: Every day | ORAL | 3 refills | Status: DC

## 2018-03-31 NOTE — Patient Instructions (Addendum)
Medication Instructions:  Your physician has recommended you make the following change in your medication:  1.  STOP the Hydrochlorothiazide 2.  START Chlorthalidone 25 mg taking 1 tablet daily 3.  START Amlodipine 5 mg taking 1 tablet daily   Labwork: TODAY:  CMET, CBC, & MAG   Testing/Procedures: None ordered  Follow-Up: Your physician recommends that you schedule a follow-up appointment in: [place]   Any Other Special Instructions Will Be Listed Below (If Applicable).     If you need a refill on your cardiac medications before your next appointment, please call your pharmacy.

## 2018-04-05 ENCOUNTER — Ambulatory Visit (INDEPENDENT_AMBULATORY_CARE_PROVIDER_SITE_OTHER): Payer: Medicare Other | Admitting: Pharmacist

## 2018-04-05 VITALS — BP 140/68 | HR 60

## 2018-04-05 DIAGNOSIS — I10 Essential (primary) hypertension: Secondary | ICD-10-CM | POA: Diagnosis not present

## 2018-04-05 MED ORDER — AMLODIPINE BESYLATE 10 MG PO TABS: 10.0000 mg | ORAL_TABLET | Freq: Every day | ORAL | 3 refills | Status: DC

## 2018-04-05 NOTE — Patient Instructions (Addendum)
It was nice to meet you today  Increase your amlodipine to 10mg  and start taking this in the evening  Continue taking your other medications  Monitor your blood pressure at home - your goal is < 130/40mmHg  Follow up in clinic in 4 weeks for a blood pressure check

## 2018-04-05 NOTE — Progress Notes (Signed)
Patient ID: Donna Guerrero                 DOB: 1941/07/10                      MRN: [id number]     HPI: Donna Guerrero is a 77 y.o. female patient of Dr Irish Lack referred by Melina Copa, PA to HTN clinic. PMH is significant for HTN, HLD, DM, hypothyroidism, and essential tremor. At last visit 1 week ago, BP was elevated at 170/80 and pt was started on amlodipine 5mg  daily and switched from HCTZ to chlorthalidone 25mg  daily.  Pt is under a lot of stress as she is the primary caregiver for her husband who is on hospice with progressive vascular dementia. She has also been dealing with complications after eye surgery requiring frequent eye drops. She is tolerating her new medications well. She denies dizziness, blurred vision, falls, or headache. Reports she was particularly stressed at her last appt and home BP readings have been improved at 145/77 and 131/77. She does not take NSAIDs at home. She took her medications about 1 hour ago.  Current HTN meds: amlodipine 5mg  daily, chlorthalidone 25mg  daily, lisinopril 40mg  daily, propranolol 80mg  daily - PM (for essential tremor)  BP goal: <130/47mmHg  Family History: The patient's family history includes CAD in her father; COPD in her brother; Heart attack in her father; Heart disease in her mother. There is no history of Hypertension or Stroke.  Social History: 2 glasses of wine per week. Denies tobacco and illicit drug use.  Diet: Breakfast - eggs and toast. Lunch - low sodium [place] sandwich. Dinner - Patent examiner, [employer] hamburger, Japanese hibachi - generally eats out. Has a sweet tooth - likes cake. 1 cup of caffeinated tea in the morning, sometimes a diet Coke or diet ginger ale later on.  Exercise: Stays active caring for her husband in a large house.  Home BP readings: 131/77, 145/77  Wt Readings from Last 3 Encounters:  03/31/18 150 lb 12.8 oz (68.4 kg)  03/19/17 145 lb 6.4 oz (66 kg)  02/24/17 148 lb 6.4 oz (67.3 kg)   BP  Readings from Last 3 Encounters:  03/31/18 (!) 170/80  03/19/17 118/60  02/24/17 (!) 170/90   Pulse Readings from Last 3 Encounters:  03/31/18 62  03/19/17 64  02/24/17 61    Renal function: Estimated Creatinine Clearance: 51.7 mL/min (by C-G formula based on SCr of 0.73 mg/dL).  Past Medical History:  Diagnosis Date  . Allergic rhinitis   . Diabetes mellitus without complication ([place])    TYPE II  . Essential tremor   . 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 Hyperlipidemia    INTOLERANT OF STATINS  . Hypertension   . Hypothyroidism   . Rosacea   . Whooping cough     Current Outpatient Medications on File Prior to Visit  Medication Sig Dispense Refill  . amLODipine (NORVASC) 5 MG tablet Take 1 tablet (5 mg total) by mouth daily. 90 tablet 3  . chlorthalidone (HYGROTON) 25 MG tablet Take 1 tablet (25 mg total) by mouth daily. 90 tablet 3  . dorzolamide (TRUSOPT) 2 % ophthalmic solution Place 1 drop into the left eye 2 (two) times daily.    . dorzolamide-timolol (COSOPT) 22.3-6.8 MG/ML ophthalmic solution Place 1 drop into the left eye 2 (two) times daily.     Marland Kitchen glucose blood test strip 1 each by Other route as needed for other. Use as  instructed    . JARDIANCE 25 MG TABS tablet Take 25 mg by mouth daily.    Marland Kitchen latanoprost (XALATAN) 0.005 % ophthalmic solution Place 1 drop into both eyes at bedtime.     Marland Kitchen levothyroxine (SYNTHROID, LEVOTHROID) 137 MCG tablet Take 112 mcg by mouth daily before breakfast. Reduced to 125 mcg    . lisinopril (PRINIVIL,ZESTRIL) 40 MG tablet Take 1 tablet (40 mg total) by mouth daily. 90 tablet 1  . naproxen sodium (ANAPROX) 220 MG tablet Take 220 mg by mouth 2 (two) times daily as needed ((KNEE PAIN)).     Marland Kitchen potassium chloride SA (K-DUR,KLOR-CON) 20 MEQ tablet Take 1 tablet (20 mEq total) by mouth daily. Please keep upcoming appt for future refills. Thank you 30 tablet 0  . propranolol ER (INDERAL LA) 80 MG 24 hr capsule TAKE 1 CAPSULE BY MOUTH ONCE DAILY 30 capsule 1  .  SYNTHROID 112 MCG tablet Take 112 mcg by mouth daily before breakfast.      No current facility-administered medications on file prior to visit.     Allergies  Allergen Reactions  . Welchol [Colesevelam Hcl] Other (See Comments)    Tablets were too big, CAUSED FLU-LIKE SYMPTOMS  . Amaryl [Glimepiride] Other (See Comments)    headache  . Atorvastatin     MYALGIAS  . Codeine Nausea Only  . Crestor [Rosuvastatin]     Myalgias  . Simvastatin Nausea Only     Assessment/Plan:  1. Hypertension - BP much improved today, however remains above goal <130/5mmHg. Will increase amlodipine to 10mg  daily and move dosing to the evening. Will continue chlorthalidone 25mg  daily, lisinopril 40mg  daily, and propranolol 80mg  daily (for tremors). Advised pt to call with any LEE. F/u in HTN clinic in 4 weeks.    E. , PharmD, BCACP, [facility] [address] [place], [place] [address] Phone: [phone]; Fax: [phone] 04/05/2018 10:45 AM

## 2018-04-15 ENCOUNTER — Telehealth: Payer: Self-pay | Admitting: Pharmacist

## 2018-04-15 NOTE — Telephone Encounter (Signed)
Pt called clinic to report some dizziness when she bends over then sits back up. BP 120s/70s on higher dose of amlodipine 10mg  daily. Will cut dose back to amlodipine 7.5mg  daily and advised pt to stay well hydrated with water.  She also does not like taking her beta blocker - she takes propranolol for essential tremor but feels fatigued when she walks. Discussed decreasing propranolol to 60mg  daily. She does not want to make 2 medication changes at once. She would like to discuss propranolol dosing at her upcoming HTN visit after she has been on her lower amlodipine dose.

## 2018-04-25 ENCOUNTER — Telehealth: Payer: Self-pay | Admitting: Interventional Cardiology

## 2018-04-25 NOTE — Telephone Encounter (Signed)
Called patient back. Patient is adamant about hearing from Dr. Irish Lack. Patient stated ever since her medications were changed at office visit with Melina Copa PA on 03/31/18 she has had dizziness. Patient stated she is under a lot of stress with taking care of a dying husband. Patient would like to go back on HCTZ instead of Chlorthalidone. Informed patient that lowering her beta blocker might help, per PharmD. Patient wants Dr. Irish Lack to advise only. Patient reported her BP today, on current medications- BP 127/66 and HR 58. Will forward to Dr. Irish Lack and his nurse.

## 2018-04-25 NOTE — Telephone Encounter (Signed)
New message   Patient calling only want to see Dr. Irish Lack and not APP.   STAT if patient feels like he/she is going to faint   1) Are you dizzy now? No   2) Do you feel faint or have you passed out? No - when laying down the room goes around  3) Do you have any other symptoms? Yes, taken a beta blocker -propranolol ER (INDERAL LA) 80 MG 24 hr capsule  4) Have you checked your HR and BP (record if available)? No. Last blood pressure check with Megan Supple  140/ 68

## 2018-04-25 NOTE — Telephone Encounter (Signed)
Would recommend reduce propranolol as previously discussed with Fuller Canada, PharmD to 60mg  daily to see if this helps. Would also recommend monitoring pressures and stay well hydrated.

## 2018-04-28 MED ORDER — PROPRANOLOL HCL 40 MG PO TABS: 40.0000 mg | ORAL_TABLET | Freq: Every day | ORAL | 1 refills | Status: DC

## 2018-04-28 NOTE — Telephone Encounter (Signed)
Decrease propranolol to 40 mg daily.  May not be able to break 80 mg since it is a long acting formulation.

## 2018-04-28 NOTE — Telephone Encounter (Signed)
Called and made patient aware of recommendations to decrease propranolol to 40 mg QD. Patient states that she has been under a lot of stress taking care of her dying husband and that is why her BP had been elevated. Patient states that ever since changes to her meds were made she has felt dizzy when changes positions. Patient states that she has been staying hydrated. Patient states that she cut back on the amlodipine to 5 mg on her own and the dizziness has improved slightly but she is requesting to go back to taking the meds that she was taking prior to seeing Melina Copa, PA on 6/27. Patient has an appointment in the HTN Clinic on 7/30. Asked patient to try lower dose of propranolol and see if her Sx improve. Instructed patient to change positions slowly, keep a log of her BP and Sx and bring to her appointment and we will make additional changes to her meds if needed at that time. Patient agrees to this plan and was appreciative of the call. Rx sent to preferred pharmacy.

## 2018-04-29 ENCOUNTER — Other Ambulatory Visit: Payer: Self-pay | Admitting: Interventional Cardiology

## 2018-04-29 NOTE — Telephone Encounter (Signed)
If sx do not resolve by her 7/30 appt, ok to go back to medicine regimen that was used before 6/27 appt.

## 2018-05-03 ENCOUNTER — Ambulatory Visit (INDEPENDENT_AMBULATORY_CARE_PROVIDER_SITE_OTHER): Payer: Medicare Other | Admitting: Pharmacist

## 2018-05-03 VITALS — BP 124/60 | HR 66

## 2018-05-03 DIAGNOSIS — I10 Essential (primary) hypertension: Secondary | ICD-10-CM

## 2018-05-03 MED ORDER — AMLODIPINE BESYLATE 5 MG PO TABS: 5.0000 mg | ORAL_TABLET | Freq: Every day | ORAL | 3 refills | Status: DC

## 2018-05-03 NOTE — Progress Notes (Signed)
Patient ID: Donna Guerrero                 DOB: 1941/08/08                      MRN: [id number]     HPI: Donna Guerrero is a 77 y.o. female patient of Dr Irish Lack referred by Melina Copa, PA to HTN clinic. PMH is significant for HTN, HLD, DM, hypothyroidism, and essential tremor. At last visit in HTN clinic a month ago, amlodipine was increased to 10mg  daily and dosing moved to PM. She called clinic 10 days later and reported dizziness when bending over and sitting back up. BP readings were 120s/70s. Her amlodipine was decreased to 7.5mg  daily and pt was advised to stay hydrated. She also stated that she did not like taking her propranolol due to fatigue when walking. Discussed decreasing her dose however she did not want to make 2 medication changes at once. She then called clinic 10 days later with reports of dizziness - BP 127/66 and HR 58 at that time. Dr Irish Lack advised pt to decrease propranolol to 40mg  daily. Pt reported reducing her amlodipine dose on her own back to 5mg . She requested to resume meds she was taking prior to seeing Melina Copa on 6/27 since she has been feeling dizzy since then. Of note, BP was elevated 170/80 at that visit and previously discussed with pt that she may feel a bit dizzy as her BP begins to normalize to a safe number. Pt encouraged to monitor BP and presents today for follow up.  Pt presents today in good spirits. She reports feeling better overall since her recent medication changes. She continues to be under a significant amount of stress as she is the primary caregiver for her husband who is on hospice with progressive vascular dementia. She has also been dealing with complications after eye surgery requiring frequent eye drops. She is no longer as dizzy when she bends over and stands back up. Occasional dizziness when she gets up from laying down at night. BP at home 136/62 and 127/66 most recently, no low BP readings. Discussed that some dizziness is likely as her  BP was 170/80 a month ago. She does not take NSAIDs at home. Takes a daily Tylenol for joint pain. She inquires as to when she will see Dr Irish Lack next - last OV was May 2018.  Current HTN meds: amlodipine 5mg  daily (PM), chlorthalidone 25mg  daily, lisinopril 40mg  daily, propranolol 40mg  daily - PM (for essential tremor)  BP goal: <130/63mmHg  Family History: The patient's family history includes CAD in her father; COPD in her brother; Heart attack in her father; Heart disease in her mother. There is no history of Hypertension or Stroke.  Social History: 2 glasses of wine per week. Denies tobacco and illicit drug use.  Diet: Breakfast - eggs and toast. Lunch - low sodium [place] sandwich. Dinner - Patent examiner, [employer] hamburger, Japanese hibachi - generally eats out. Has a sweet tooth - likes cake. 1 cup of caffeinated tea in the morning, sometimes a diet Coke or diet ginger ale later on.  Exercise: Stays active caring for her husband in a large house.  Home BP readings: 136/62, 127/66, HR 58 since most recent med changes  Wt Readings from Last 3 Encounters:  03/31/18 150 lb 12.8 oz (68.4 kg)  03/19/17 145 lb 6.4 oz (66 kg)  02/24/17 148 lb 6.4 oz (67.3 kg)   BP  Readings from Last 3 Encounters:  04/05/18 140/68  03/31/18 (!) 170/80  03/19/17 118/60   Pulse Readings from Last 3 Encounters:  04/05/18 60  03/31/18 62  03/19/17 64    Renal function: CrCl cannot be calculated (Patient's most recent lab result is older than the maximum 21 days allowed.).  Past Medical History:  Diagnosis Date  . Allergic rhinitis   . Diabetes mellitus without complication ([place])    TYPE II  . Essential tremor   . 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 Hyperlipidemia    INTOLERANT OF STATINS  . Hypertension   . Hypothyroidism   . Rosacea   . Whooping cough     Current Outpatient Medications on File Prior to Visit  Medication Sig Dispense Refill  . amLODipine (NORVASC) 10 MG tablet Take 1 tablet (10 mg  total) by mouth daily. (Patient taking differently: Take 5 mg by mouth daily. ) 90 tablet 3  . chlorthalidone (HYGROTON) 25 MG tablet Take 1 tablet (25 mg total) by mouth daily. 90 tablet 3  . dorzolamide (TRUSOPT) 2 % ophthalmic solution Place 1 drop into the left eye 2 (two) times daily.    . dorzolamide-timolol (COSOPT) 22.3-6.8 MG/ML ophthalmic solution Place 1 drop into the left eye 2 (two) times daily.     Marland Kitchen glucose blood test strip 1 each by Other route as needed for other. Use as instructed    . JARDIANCE 25 MG TABS tablet Take 25 mg by mouth daily.    Marland Kitchen latanoprost (XALATAN) 0.005 % ophthalmic solution Place 1 drop into both eyes at bedtime.     Marland Kitchen levothyroxine (SYNTHROID, LEVOTHROID) 137 MCG tablet Take 112 mcg by mouth daily before breakfast. Reduced to 125 mcg    . lisinopril (PRINIVIL,ZESTRIL) 40 MG tablet Take 1 tablet (40 mg total) by mouth daily. 90 tablet 1  . naproxen sodium (ANAPROX) 220 MG tablet Take 220 mg by mouth 2 (two) times daily as needed ((KNEE PAIN)).     Marland Kitchen potassium chloride SA (K-DUR,KLOR-CON) 20 MEQ tablet Take 1 tablet (20 mEq total) by mouth daily. 90 tablet 3  . propranolol (INDERAL) 40 MG tablet Take 1 tablet (40 mg total) by mouth daily. 30 tablet 1  . SYNTHROID 112 MCG tablet Take 112 mcg by mouth daily before breakfast.      No current facility-administered medications on file prior to visit.     Allergies  Allergen Reactions  . Welchol [Colesevelam Hcl] Other (See Comments)    Tablets were too big, CAUSED FLU-LIKE SYMPTOMS  . Amaryl [Glimepiride] Other (See Comments)    headache  . Atorvastatin     MYALGIAS  . Codeine Nausea Only  . Crestor [Rosuvastatin]     Myalgias  . Simvastatin Nausea Only     Assessment/Plan:  1. Hypertension - BP excellent today at goal <130/79mmHg and pt is tolerating medication regimen well. Will continue amlodipine 5mg  daily, chlorthalidone 25mg  daily, lisinopril 40mg  daily and propranolol 40mg  daily. Pt will make  appt for annual follow up with Dr Irish Lack. F/u in HTN clinic as needed.   Andrue Dini E. Supple, PharmD, BCACP, [place] [address], [place], [place] [address] Phone: [phone]; Fax: [phone] 05/03/2018 2:23 PM

## 2018-05-03 NOTE — Patient Instructions (Addendum)
Schedule 1 year follow up appointment with Dr Irish Lack - due June 2019  Continue taking your current blood pressure medications  Keep an eye on your blood pressure at home - your goal is < 130/61mmHg

## 2018-06-23 ENCOUNTER — Other Ambulatory Visit: Payer: Self-pay | Admitting: Interventional Cardiology

## 2018-07-29 ENCOUNTER — Other Ambulatory Visit: Payer: Self-pay

## 2018-07-29 ENCOUNTER — Ambulatory Visit: Payer: Medicare Other | Attending: Geriatric Medicine | Admitting: Physical Therapy

## 2018-07-29 ENCOUNTER — Encounter: Payer: Self-pay | Admitting: Physical Therapy

## 2018-07-29 DIAGNOSIS — R42 Dizziness and giddiness: Secondary | ICD-10-CM | POA: Insufficient documentation

## 2018-07-29 NOTE — Therapy (Signed)
[facility] [address] [place], [place], [address] Phone: [phone]   Fax:  [phone]  Physical Therapy Evaluation  Patient Details  Name: Donna Guerrero MRN: [id number] Date of Birth: [birth year] Referring Provider (PT): Dr. Felipa Guerrero   Encounter Date: 07/29/2018  PT End of Session - 07/29/18 1424    Visit Number  1    Authorization Type  [employer] Medicare    PT Start Time  2297    PT Stop Time  1402    PT Time Calculation (min)  41 min    Activity Tolerance  Patient tolerated treatment well    Behavior During Therapy  Donna Guerrero for tasks assessed/performed       Past Medical History:  Diagnosis Date  . Allergic rhinitis   . Diabetes mellitus without complication ([place])    TYPE II  . Essential tremor   . 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 Hyperlipidemia    INTOLERANT OF STATINS  . Hypertension   . Hypothyroidism   . Rosacea   . Whooping cough     Past Surgical History:  Procedure Laterality Date  . CATARACT EXTRACTION    . RETINAL DETACHMENT SURGERY      There were no vitals filed for this visit.   Subjective Assessment - 07/29/18 1321    Subjective  patient reporting last Friday - taken regular medications + tylenol. Went to lay down in bed and felt like the "world was turning." Felt nauseated. Was seen by PCP on Tuesday. Puts drops in her eyes every evening for glaucoma. Feels like medication has played a part in all of this. Has some symptoms when looking through and changing glasses. Has not had another episode since. Reports MD check orthostatic BP with no further discussion. Reports she takes 3 blood pressure medications and is concerned that this is a blood pressure issue.    Pertinent History  DM, essential tremor, glaucoma, HTN, HLD    Patient Stated Goals  none stated    Currently in Pain?  No/denies    Multiple Pain Sites  No         [facility] PT Assessment - 07/29/18 0001      Assessment   Medical Diagnosis   BPPV    Referring Provider (PT)  Dr. Felipa Guerrero    Onset Date/Surgical Date  --   ~1 week ago   Prior Therapy  no      Precautions   Precautions  Fall      Balance Screen   Has the patient fallen in the past 6 months  No    Has the patient had a decrease in activity level because of a fear of falling?   No    Is the patient reluctant to leave their home because of a fear of falling?   No      Home Environment   Living Environment  Private residence    Living Arrangements  Spouse/significant other;Children    Type of [place]  None      Prior Function   Level of Independence  Independent    Vocation Requirements  primary caregiver for husband      Cognition   Overall Cognitive Status  Within Functional Limits for tasks assessed      Posture/Postural Control   Posture/Postural Control  Postural limitations    Postural Limitations  Rounded Shoulders;Forward head      ROM / Strength   AROM / PROM /  Strength  AROM      AROM   Overall AROM   Within functional limits for tasks performed    Overall AROM Comments  C-Spine           Vestibular Assessment - 07/29/18 1331      Vestibular Assessment   General Observation  essential tremors; L eye redness; reports her husband is in hospice care and she is the primary caregiver      Symptom Behavior   Type of Dizziness  Spinning   "I felt like I was getting ready to pass out"   Frequency of Dizziness  1x    Duration of Dizziness  5 minutes    Aggravating Factors  Lying supine;Rolling to left    Relieving Factors  Head stationary   sitting up in recliner     Occulomotor Exam   Occulomotor Alignment  Normal    Spontaneous  Absent    Smooth Pursuits  Intact    Saccades  Intact    Comment  head impulse testing negative B      Positional Testing   Dix-Hallpike  Dix-Hallpike Right;Dix-Hallpike Left      Dix-Hallpike Right   Dix-Hallpike Right Symptoms  No nystagmus      Dix-Hallpike Left    Dix-Hallpike Left Symptoms  No nystagmus      Cognition   Cognition Orientation Level  Oriented x 4      Orthostatics   BP supine (x 5 minutes)  138/84    BP standing (after 1 minute)  132/80    BP standing (after 3 minutes)  134/82          Objective measurements completed on examination: See above findings.              PT Education - 07/29/18 1423    Education Details  exam findings, no current skilled need for PT, normal BP with positional changes, no noted nystagmus with eye position or positional changes    Person(s) Educated  Patient    Methods  Explanation    Comprehension  Verbalized understanding                  Plan - 07/29/18 1425    Clinical Impression Statement  Donna Guerrero presenting to OPPT today regarding primary complaints of episode of dizziness/lightheadedness/faintness when performing sit to supine. Reports episode occured when she took her BP and heart medication concurrently and has had no episodes since. Patient today with normal oculomotor exam and no onset of symptoms with positional testing. Assessed orthostatic BP today with no significant change noted with supine to standing and after standing for 3 minutes. Patient educated on exam findings. Patient with some worriness regarding amount of BP medication currently take - advised that she bring this to her MD's attention and possibly request a medication reconciliation. One time visit today with discussion with patient on availability to return to PT in the future for other needs.     History and Personal Factors relevant to plan of care:  DM, essential tremor, glaucoma, HTN, HLD    Clinical Presentation  Stable    Clinical Presentation due to:  DM, essential tremor, glaucoma, HTN, HLD    Clinical Decision Making  Low    Rehab Potential  --    PT Frequency  One time visit    PT Next Visit Plan  one time visit    Consulted and Agree with Plan of Care  Patient  Patient will  benefit from skilled therapeutic intervention in order to improve the following deficits and impairments:     Visit Diagnosis: Dizziness and giddiness     Problem List Patient Active Problem List   Diagnosis Date Noted  . Essential hypertension, benign 10/03/2013  . Mixed hyperlipidemia 10/03/2013  . Hypothyroidism 10/03/2013  . Type II or unspecified type diabetes mellitus without mention of complication, not stated as uncontrolled 10/03/2013     Lanney Gins, PT, DPT Supplemental Physical Therapist 07/29/18 3:15 PM Pager: [phone] Office: [facility] [address] [place], [place], [address] Phone: [phone]   Fax:  [phone]  Name: Donna Guerrero MRN: [id number] Date of Birth: [birth year]

## 2019-03-17 ENCOUNTER — Other Ambulatory Visit: Payer: Self-pay | Admitting: Physician Assistant

## 2019-03-17 ENCOUNTER — Other Ambulatory Visit: Payer: Self-pay | Admitting: Interventional Cardiology

## 2019-06-21 ENCOUNTER — Other Ambulatory Visit: Payer: Self-pay | Admitting: Physician Assistant

## 2019-06-21 MED ORDER — PROPRANOLOL HCL 40 MG PO TABS: 40.0000 mg | ORAL_TABLET | Freq: Every day | ORAL | 0 refills | Status: DC

## 2019-10-24 ENCOUNTER — Ambulatory Visit: Payer: Medicare Other | Attending: Internal Medicine

## 2019-10-24 ENCOUNTER — Other Ambulatory Visit: Payer: Self-pay

## 2019-10-24 DIAGNOSIS — Z23 Encounter for immunization: Secondary | ICD-10-CM | POA: Insufficient documentation

## 2019-10-24 NOTE — Progress Notes (Signed)
   Covid-19 Vaccination Clinic  Name:  Donna Guerrero    MRN: [id number] DOB: [birth year]  10/24/2019  Donna Guerrero was observed post Covid-19 immunization for 15 minutes without incidence. She was provided with Vaccine Information Sheet and instruction to access the V-Safe system.   Donna Guerrero was instructed to call 911 with any severe reactions post vaccine: Marland Kitchen Difficulty breathing  . Swelling of your face and throat  . A fast heartbeat  . A bad rash all over your body  . Dizziness and weakness    Immunizations Administered    Name Date Dose VIS Date Route   Pfizer COVID-19 Vaccine 10/24/2019  9:23 AM 0.3 mL 09/15/2019 Intramuscular   Manufacturer: [place]   Lot: [id number]   [place]: [id number]

## 2019-11-14 ENCOUNTER — Ambulatory Visit: Payer: Medicare Other | Attending: Internal Medicine

## 2019-11-14 DIAGNOSIS — Z23 Encounter for immunization: Secondary | ICD-10-CM | POA: Insufficient documentation

## 2019-11-14 NOTE — Progress Notes (Signed)
   Covid-19 Vaccination Clinic  Name:  Donna Guerrero    MRN: [id number] DOB: 05/30/1941  11/14/2019  Ms. Door was observed post Covid-19 immunization for 15 minutes without incidence. She was provided with Vaccine Information Sheet and instruction to access the V-Safe system.   [name] was instructed to call 911 with any severe reactions post vaccine: Marland Kitchen Difficulty breathing  . Swelling of your face and throat  . A fast heartbeat  . A bad rash all over your body  . Dizziness and weakness    Immunizations Administered    Name Date Dose VIS Date Route   Pfizer COVID-19 Vaccine 11/14/2019 12:35 PM 0.3 mL 09/15/2019 Intramuscular   Manufacturer: [place]   Lot: [id number]   [place]: [id number]

## 2020-04-29 ENCOUNTER — Other Ambulatory Visit: Payer: Self-pay

## 2020-06-03 NOTE — Progress Notes (Signed)
Cardiology Office Note   Date:  06/04/2020   ID:  Donna Guerrero, DOB [birth year], MRN [id number]  PCP:  Lajean Manes, MD    No chief complaint on file.  HTN, exertional chest pain  Wt Readings from Last 3 Encounters:  06/04/20 141 lb (64 kg)  03/31/18 150 lb 12.8 oz (68.4 kg)  03/19/17 145 lb 6.4 oz (66 kg)       History of Present Illness: Donna Guerrero is a 79 y.o. female   Who has had HTN, high cholesterol and DM.  She has had difficulty tolerating medicines.   She has been on simvastatin and atorvastatin but had side effects from both. Crestor was started. She is now off of the crestor.  SHe is trying to change diet to improve cholesterol.   Hbg A1C was elevated so Amaryl was started. Amaryl was stopped as well. Metformin was started.  THis was stopped.  Januvia was tried as well without success.  She is changing her diet.  She has had eye issues in the past treated with drops.   She has had DOE noted in the past as well.   She is stressed from taking care of her husband with dementia, and is bed ridden.  She feels that she can feel her heartbeat with activity, and it is a somewhat uncomfortable.  Not really a pain, but more of a pressure sensation.        Past Medical History:  Diagnosis Date  . Allergic rhinitis   . Diabetes mellitus without complication ([place])    TYPE II  . Essential tremor   . 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 Hyperlipidemia    INTOLERANT OF STATINS  . Hypertension   . Hypothyroidism   . Rosacea   . Whooping cough     Past Surgical History:  Procedure Laterality Date  . CATARACT EXTRACTION    . RETINAL DETACHMENT SURGERY       Current Outpatient Medications  Medication Sig Dispense Refill  . acetaminophen (TYLENOL) 500 MG tablet Take 500 mg by mouth every 6 (six) hours as needed.    Marland Kitchen amLODipine (NORVASC) 2.5 MG tablet Take 2.5 mg by mouth daily.    . chlorthalidone (HYGROTON) 25 MG tablet Take 1 tablet (25 mg total) by mouth daily.  Please make overdue appt with Dr. Irish Lack before anymore refills. 1st attempt 30 tablet 0  . dorzolamide (TRUSOPT) 2 % ophthalmic solution Place 1 drop into the left eye 2 (two) times daily.    Marland Kitchen glucose blood test strip 1 each by Other route as needed for other. Use as instructed    . JARDIANCE 25 MG TABS tablet Take 25 mg by mouth daily.    Marland Kitchen latanoprost (XALATAN) 0.005 % ophthalmic solution Place 1 drop into both eyes at bedtime.     Marland Kitchen levothyroxine (SYNTHROID, LEVOTHROID) 137 MCG tablet Take 112 mcg by mouth daily before breakfast. Reduced to 125 mcg    . lisinopril (PRINIVIL,ZESTRIL) 40 MG tablet Take 1 tablet (40 mg total) by mouth daily. 90 tablet 1  . naproxen sodium (ALEVE) 220 MG tablet Take by mouth as needed.    . potassium chloride SA (K-DUR,KLOR-CON) 20 MEQ tablet Take 1 tablet (20 mEq total) by mouth daily. 90 tablet 3  . propranolol (INDERAL) 40 MG tablet Take 1 tablet (40 mg total) by mouth daily. Please make overdue appt with Dr. Irish Lack before anymore refills. 1st attempt 30 tablet 0  . SYNTHROID 88 MCG tablet  Take 88 mcg by mouth every morning.    Marland Kitchen VYZULTA 0.024 % SOLN Apply 1 drop to eye at bedtime.     No current facility-administered medications for this visit.    Allergies:   Welchol [colesevelam hcl], Amaryl [glimepiride], Atorvastatin, Codeine, Crestor [rosuvastatin], and Simvastatin    Social History:  The patient  reports that she has never smoked. She has never used smokeless tobacco. She reports current alcohol use of about 2.0 standard drinks of alcohol per week. She reports that she does not use drugs.   Family History:  The patient's family history includes CAD in her father; COPD in her brother; Heart attack in her father; Heart disease in her mother.    ROS:  Please see the history of present illness.   Otherwise, review of systems are positive for stress at home.   All other systems are reviewed and negative.    PHYSICAL EXAM: VS:  BP 128/70   Pulse  67   Ht 5' (1.524 m)   Wt 141 lb (64 kg)   SpO2 99%   BMI 27.54 kg/m  , BMI Body mass index is 27.54 kg/m. GEN: Well nourished, well developed, in no acute distress  HEENT: normal  Neck: no JVD, carotid bruits, or masses Cardiac: RRR, premature beats; no murmurs, rubs, or gallops,no edema  Respiratory:  clear to auscultation bilaterally, normal work of breathing GI: soft, nontender, nondistended, + BS MS: no deformity or atrophy  Skin: warm and dry, no rash Neuro:  Strength and sensation are intact; mild tremor Psych: euthymic mood, full affect   EKG:   The ekg ordered today demonstrates NSR, PAC   Recent Labs: No results found for requested labs within last 8760 hours.   Lipid Panel    Component Value Date/Time   CHOL 209 (H) 02/19/2014 0925   TRIG 81.0 02/19/2014 0925   HDL 64.10 02/19/2014 0925   CHOLHDL 3 02/19/2014 0925   VLDL 16.2 02/19/2014 0925   LDLCALC 129 (H) 02/19/2014 0925     Other studies Reviewed: Additional studies/ records that were reviewed today with results demonstrating: 2016 stress test was normal; labs reviewed.   ASSESSMENT AND PLAN:  1. Atypical chest discomfort: Plan for CTA coronaries to look for CAD given RF for CAD.  Can take an extra propranolol prior to the CT.  She typically takes this at night.  Will have her take a dose prior to CT. 2. HTN: The current medical regimen is effective;  continue present plan and medications. 3. DOE: Persistent, mild, unchanged.  Eval for CAD. 4. DM: She is trying to manage with diet since she has not tolerated several meds including Jardiance.  She has lost weight since improving diet.  5. Hyperlipidemia: She is taking Livalo 3x/week and tolerating this.  LDL 172 in Jan 2021.   Current medicines are reviewed at length with the patient today.  The patient concerns regarding her medicines were addressed.  The following changes have been made:  No change  Labs/ tests ordered today include:  No orders  of the defined types were placed in this encounter.   Recommend 150 minutes/week of aerobic exercise Low fat, low carb, high fiber diet recommended  Disposition:   FU in based on test results   Signed, Larae Grooms, MD  06/04/2020 9:36 AM    [facility] [place], [place], [place]  [address] Phone: [phone]; Fax: [phone]

## 2020-06-04 ENCOUNTER — Other Ambulatory Visit: Payer: Self-pay

## 2020-06-04 ENCOUNTER — Ambulatory Visit: Payer: Medicare Other | Admitting: Interventional Cardiology

## 2020-06-04 ENCOUNTER — Encounter: Payer: Self-pay | Admitting: Interventional Cardiology

## 2020-06-04 VITALS — BP 128/70 | HR 67 | Ht 60.0 in | Wt 141.0 lb

## 2020-06-04 DIAGNOSIS — I10 Essential (primary) hypertension: Secondary | ICD-10-CM

## 2020-06-04 DIAGNOSIS — R0600 Dyspnea, unspecified: Secondary | ICD-10-CM | POA: Diagnosis not present

## 2020-06-04 DIAGNOSIS — E1159 Type 2 diabetes mellitus with other circulatory complications: Secondary | ICD-10-CM

## 2020-06-04 DIAGNOSIS — E782 Mixed hyperlipidemia: Secondary | ICD-10-CM | POA: Diagnosis not present

## 2020-06-04 DIAGNOSIS — R0609 Other forms of dyspnea: Secondary | ICD-10-CM

## 2020-06-04 DIAGNOSIS — R072 Precordial pain: Secondary | ICD-10-CM

## 2020-06-04 NOTE — Patient Instructions (Signed)
Medication Instructions:  Your physician recommends that you continue on your current medications as directed. Please refer to the Current Medication list given to you today.  *If you need a refill on your cardiac medications before your next appointment, please call your pharmacy*   Lab Work: None today  If you have labs (blood work) drawn today and your tests are completely normal, you will receive your results only by: Marland Kitchen MyChart Message (if you have MyChart) OR . A paper copy in the mail If you have any lab test that is abnormal or we need to change your treatment, we will call you to review the results.   Testing/Procedures: Your physician has requested that you have cardiac CT.     Follow-Up: Based on test results   Other Instructions Your cardiac CT will be scheduled at one of the below locations:   [facility] [address] [place], [place] [address] [phone]  [facility] [address] [place], [place] [address] [phone]  If scheduled at [facility], please arrive at the [facility] main entrance of [facility] 30 minutes prior to test start time. Proceed to the [facility] Radiology Department (first floor) to check-in and test prep.  If scheduled at [facility], please arrive 15 mins early for check-in and test prep.  Please follow these instructions carefully (unless otherwise directed):    On the Night Before the Test: . Be sure to Drink plenty of water. . Do not consume any caffeinated/decaffeinated beverages or chocolate 12 hours prior to your test. . Do not take any antihistamines 12 hours prior to your test.  On the Day of the Test: . Drink plenty of water. Do not drink any water within one hour of the test. . Do not eat any food 4 hours prior to the test. . You may take your regular medications prior to the test.  . Take an extra  propranolol 40 mg tablet two hours prior to test. . HOLD Chlorthalidone the morning of the test. . HOLD Jardiance the morning of the test. . FEMALES- please wear underwire-free bra if available          After the Test: . Drink plenty of water. . After receiving IV contrast, you may experience a mild flushed feeling. This is normal. . On occasion, you may experience a mild rash up to 24 hours after the test. This is not dangerous. If this occurs, you can take Benadryl 25 mg and increase your fluid intake. . If you experience trouble breathing, this can be serious. If it is severe call 911 IMMEDIATELY. If it is mild, please call our office.   Once we have confirmed authorization from your insurance company, we will call you to set up a date and time for your test. Based on how quickly your insurance processes prior authorizations requests, please allow up to 4 weeks to be contacted for scheduling your Cardiac CT appointment. Be advised that routine Cardiac CT appointments could be scheduled as many as 8 weeks after your provider has ordered it.  For non-scheduling related questions, please contact the cardiac imaging nurse navigator should you have any questions/concerns: Marchia Bond, Cardiac Imaging Nurse Navigator Burley Saver, Interim Cardiac Imaging Nurse [place] [facility] Direct Office Dial: [phone]   For scheduling needs, including cancellations and rescheduling, please call Vivien Rota at [phone], option 3.

## 2020-06-12 ENCOUNTER — Telehealth: Payer: Self-pay | Admitting: Interventional Cardiology

## 2020-06-12 NOTE — Telephone Encounter (Signed)
° ° ° °  Pt would like to let Dr. Irish Lack that she is holding off her cardiac CT until next year because of covid. She doesn't want to wear a mask while doing a test, if Dr. Irish Lack said it is important for her to get the CT she asked to call her back

## 2020-06-12 NOTE — Telephone Encounter (Signed)
Called and spoke to patient. She states that she does not want do the Cardiac CT at this time. She states that she does not want to go in the hospital during this time and she does not want to wear a mask. She states that she is feeling fine and wishes to cancel her test at this time. She will let us know if her Sx change or worsen.

## 2020-07-22 ENCOUNTER — Ambulatory Visit: Payer: Medicare Other | Admitting: Interventional Cardiology

## 2020-08-06 ENCOUNTER — Ambulatory Visit: Payer: Medicare Other | Attending: Internal Medicine

## 2020-08-06 DIAGNOSIS — Z23 Encounter for immunization: Secondary | ICD-10-CM

## 2020-08-06 NOTE — Progress Notes (Signed)
   Covid-19 Vaccination Clinic  Name:  Donna Guerrero    MRN: [id number] DOB: 1941-06-17  08/06/2020  [name] was observed post Covid-19 immunization for 15 minutes without incident. She was provided with Vaccine Information Sheet and instruction to access the V-Safe system.   [name] was instructed to call 911 with any severe reactions post vaccine: Marland Kitchen Difficulty breathing  . Swelling of face and throat  . A fast heartbeat  . A bad rash all over body  . Dizziness and weakness

## 2020-10-15 DIAGNOSIS — E119 Type 2 diabetes mellitus without complications: Secondary | ICD-10-CM | POA: Diagnosis not present

## 2020-10-15 DIAGNOSIS — E039 Hypothyroidism, unspecified: Secondary | ICD-10-CM | POA: Diagnosis not present

## 2020-10-15 DIAGNOSIS — E782 Mixed hyperlipidemia: Secondary | ICD-10-CM | POA: Diagnosis not present

## 2020-10-15 DIAGNOSIS — I10 Essential (primary) hypertension: Secondary | ICD-10-CM | POA: Diagnosis not present

## 2020-10-15 DIAGNOSIS — E11319 Type 2 diabetes mellitus with unspecified diabetic retinopathy without macular edema: Secondary | ICD-10-CM | POA: Diagnosis not present

## 2020-11-07 DIAGNOSIS — E11319 Type 2 diabetes mellitus with unspecified diabetic retinopathy without macular edema: Secondary | ICD-10-CM | POA: Diagnosis not present

## 2020-11-07 DIAGNOSIS — I10 Essential (primary) hypertension: Secondary | ICD-10-CM | POA: Diagnosis not present

## 2020-11-07 DIAGNOSIS — E119 Type 2 diabetes mellitus without complications: Secondary | ICD-10-CM | POA: Diagnosis not present

## 2020-11-07 DIAGNOSIS — E782 Mixed hyperlipidemia: Secondary | ICD-10-CM | POA: Diagnosis not present

## 2020-11-07 DIAGNOSIS — E039 Hypothyroidism, unspecified: Secondary | ICD-10-CM | POA: Diagnosis not present

## 2020-12-05 DIAGNOSIS — I10 Essential (primary) hypertension: Secondary | ICD-10-CM | POA: Diagnosis not present

## 2020-12-05 DIAGNOSIS — E782 Mixed hyperlipidemia: Secondary | ICD-10-CM | POA: Diagnosis not present

## 2020-12-05 DIAGNOSIS — E11319 Type 2 diabetes mellitus with unspecified diabetic retinopathy without macular edema: Secondary | ICD-10-CM | POA: Diagnosis not present

## 2020-12-05 DIAGNOSIS — E039 Hypothyroidism, unspecified: Secondary | ICD-10-CM | POA: Diagnosis not present

## 2020-12-05 DIAGNOSIS — E119 Type 2 diabetes mellitus without complications: Secondary | ICD-10-CM | POA: Diagnosis not present

## 2021-01-22 DIAGNOSIS — E11319 Type 2 diabetes mellitus with unspecified diabetic retinopathy without macular edema: Secondary | ICD-10-CM | POA: Diagnosis not present

## 2021-01-22 DIAGNOSIS — I10 Essential (primary) hypertension: Secondary | ICD-10-CM | POA: Diagnosis not present

## 2021-01-22 DIAGNOSIS — E039 Hypothyroidism, unspecified: Secondary | ICD-10-CM | POA: Diagnosis not present

## 2021-01-22 DIAGNOSIS — E119 Type 2 diabetes mellitus without complications: Secondary | ICD-10-CM | POA: Diagnosis not present

## 2021-01-22 DIAGNOSIS — E782 Mixed hyperlipidemia: Secondary | ICD-10-CM | POA: Diagnosis not present

## 2021-02-25 DIAGNOSIS — E119 Type 2 diabetes mellitus without complications: Secondary | ICD-10-CM | POA: Diagnosis not present

## 2021-02-25 DIAGNOSIS — E782 Mixed hyperlipidemia: Secondary | ICD-10-CM | POA: Diagnosis not present

## 2021-02-25 DIAGNOSIS — E11319 Type 2 diabetes mellitus with unspecified diabetic retinopathy without macular edema: Secondary | ICD-10-CM | POA: Diagnosis not present

## 2021-02-25 DIAGNOSIS — I10 Essential (primary) hypertension: Secondary | ICD-10-CM | POA: Diagnosis not present

## 2021-02-25 DIAGNOSIS — E039 Hypothyroidism, unspecified: Secondary | ICD-10-CM | POA: Diagnosis not present

## 2021-03-13 DIAGNOSIS — E11319 Type 2 diabetes mellitus with unspecified diabetic retinopathy without macular edema: Secondary | ICD-10-CM | POA: Diagnosis not present

## 2021-03-13 DIAGNOSIS — E039 Hypothyroidism, unspecified: Secondary | ICD-10-CM | POA: Diagnosis not present

## 2021-03-13 DIAGNOSIS — I10 Essential (primary) hypertension: Secondary | ICD-10-CM | POA: Diagnosis not present

## 2021-03-13 DIAGNOSIS — Z79899 Other long term (current) drug therapy: Secondary | ICD-10-CM | POA: Diagnosis not present

## 2021-04-03 DIAGNOSIS — E782 Mixed hyperlipidemia: Secondary | ICD-10-CM | POA: Diagnosis not present

## 2021-04-03 DIAGNOSIS — E039 Hypothyroidism, unspecified: Secondary | ICD-10-CM | POA: Diagnosis not present

## 2021-04-03 DIAGNOSIS — E11319 Type 2 diabetes mellitus with unspecified diabetic retinopathy without macular edema: Secondary | ICD-10-CM | POA: Diagnosis not present

## 2021-04-03 DIAGNOSIS — E119 Type 2 diabetes mellitus without complications: Secondary | ICD-10-CM | POA: Diagnosis not present

## 2021-04-03 DIAGNOSIS — I10 Essential (primary) hypertension: Secondary | ICD-10-CM | POA: Diagnosis not present

## 2021-04-15 DIAGNOSIS — E782 Mixed hyperlipidemia: Secondary | ICD-10-CM | POA: Diagnosis not present

## 2021-04-15 DIAGNOSIS — E119 Type 2 diabetes mellitus without complications: Secondary | ICD-10-CM | POA: Diagnosis not present

## 2021-04-15 DIAGNOSIS — E039 Hypothyroidism, unspecified: Secondary | ICD-10-CM | POA: Diagnosis not present

## 2021-04-15 DIAGNOSIS — I10 Essential (primary) hypertension: Secondary | ICD-10-CM | POA: Diagnosis not present

## 2021-04-15 DIAGNOSIS — E11319 Type 2 diabetes mellitus with unspecified diabetic retinopathy without macular edema: Secondary | ICD-10-CM | POA: Diagnosis not present

## 2021-05-07 DIAGNOSIS — Z961 Presence of intraocular lens: Secondary | ICD-10-CM | POA: Diagnosis not present

## 2021-05-07 DIAGNOSIS — Z9889 Other specified postprocedural states: Secondary | ICD-10-CM | POA: Diagnosis not present

## 2021-05-07 DIAGNOSIS — H401111 Primary open-angle glaucoma, right eye, mild stage: Secondary | ICD-10-CM | POA: Diagnosis not present

## 2021-05-07 DIAGNOSIS — H401123 Primary open-angle glaucoma, left eye, severe stage: Secondary | ICD-10-CM | POA: Diagnosis not present

## 2021-05-07 DIAGNOSIS — E119 Type 2 diabetes mellitus without complications: Secondary | ICD-10-CM | POA: Diagnosis not present

## 2021-05-07 DIAGNOSIS — D492 Neoplasm of unspecified behavior of bone, soft tissue, and skin: Secondary | ICD-10-CM | POA: Diagnosis not present

## 2021-05-21 DIAGNOSIS — H401123 Primary open-angle glaucoma, left eye, severe stage: Secondary | ICD-10-CM | POA: Diagnosis not present

## 2021-05-21 DIAGNOSIS — H401114 Primary open-angle glaucoma, right eye, indeterminate stage: Secondary | ICD-10-CM | POA: Diagnosis not present

## 2021-06-02 DIAGNOSIS — G250 Essential tremor: Secondary | ICD-10-CM | POA: Diagnosis not present

## 2021-06-02 DIAGNOSIS — I10 Essential (primary) hypertension: Secondary | ICD-10-CM | POA: Diagnosis not present

## 2021-06-02 DIAGNOSIS — Z79899 Other long term (current) drug therapy: Secondary | ICD-10-CM | POA: Diagnosis not present

## 2021-06-02 DIAGNOSIS — E782 Mixed hyperlipidemia: Secondary | ICD-10-CM | POA: Diagnosis not present

## 2021-06-02 DIAGNOSIS — Z0000 Encounter for general adult medical examination without abnormal findings: Secondary | ICD-10-CM | POA: Diagnosis not present

## 2021-06-02 DIAGNOSIS — E11319 Type 2 diabetes mellitus with unspecified diabetic retinopathy without macular edema: Secondary | ICD-10-CM | POA: Diagnosis not present

## 2021-06-02 DIAGNOSIS — E039 Hypothyroidism, unspecified: Secondary | ICD-10-CM | POA: Diagnosis not present

## 2021-06-02 DIAGNOSIS — Z1389 Encounter for screening for other disorder: Secondary | ICD-10-CM | POA: Diagnosis not present

## 2021-06-03 DIAGNOSIS — H401111 Primary open-angle glaucoma, right eye, mild stage: Secondary | ICD-10-CM | POA: Diagnosis not present

## 2021-06-03 DIAGNOSIS — D492 Neoplasm of unspecified behavior of bone, soft tissue, and skin: Secondary | ICD-10-CM | POA: Diagnosis not present

## 2021-06-03 DIAGNOSIS — Z9889 Other specified postprocedural states: Secondary | ICD-10-CM | POA: Diagnosis not present

## 2021-06-03 DIAGNOSIS — H401123 Primary open-angle glaucoma, left eye, severe stage: Secondary | ICD-10-CM | POA: Diagnosis not present

## 2021-06-03 DIAGNOSIS — E119 Type 2 diabetes mellitus without complications: Secondary | ICD-10-CM | POA: Diagnosis not present

## 2021-06-03 DIAGNOSIS — Z961 Presence of intraocular lens: Secondary | ICD-10-CM | POA: Diagnosis not present

## 2021-06-06 DIAGNOSIS — Z1231 Encounter for screening mammogram for malignant neoplasm of breast: Secondary | ICD-10-CM | POA: Diagnosis not present

## 2021-06-19 DIAGNOSIS — L820 Inflamed seborrheic keratosis: Secondary | ICD-10-CM | POA: Diagnosis not present

## 2021-06-19 DIAGNOSIS — L821 Other seborrheic keratosis: Secondary | ICD-10-CM | POA: Diagnosis not present

## 2021-06-19 DIAGNOSIS — D1801 Hemangioma of skin and subcutaneous tissue: Secondary | ICD-10-CM | POA: Diagnosis not present

## 2021-06-19 DIAGNOSIS — L814 Other melanin hyperpigmentation: Secondary | ICD-10-CM | POA: Diagnosis not present

## 2021-06-30 DIAGNOSIS — R928 Other abnormal and inconclusive findings on diagnostic imaging of breast: Secondary | ICD-10-CM | POA: Diagnosis not present

## 2021-07-01 DIAGNOSIS — E782 Mixed hyperlipidemia: Secondary | ICD-10-CM | POA: Diagnosis not present

## 2021-07-01 DIAGNOSIS — E039 Hypothyroidism, unspecified: Secondary | ICD-10-CM | POA: Diagnosis not present

## 2021-07-01 DIAGNOSIS — E11319 Type 2 diabetes mellitus with unspecified diabetic retinopathy without macular edema: Secondary | ICD-10-CM | POA: Diagnosis not present

## 2021-07-01 DIAGNOSIS — I10 Essential (primary) hypertension: Secondary | ICD-10-CM | POA: Diagnosis not present

## 2021-08-26 DIAGNOSIS — H401123 Primary open-angle glaucoma, left eye, severe stage: Secondary | ICD-10-CM | POA: Diagnosis not present

## 2021-09-26 DIAGNOSIS — J029 Acute pharyngitis, unspecified: Secondary | ICD-10-CM | POA: Diagnosis not present

## 2021-09-26 DIAGNOSIS — J302 Other seasonal allergic rhinitis: Secondary | ICD-10-CM | POA: Diagnosis not present

## 2021-10-09 ENCOUNTER — Ambulatory Visit: Payer: Medicare Other | Admitting: Podiatry

## 2021-10-09 ENCOUNTER — Other Ambulatory Visit: Payer: Self-pay

## 2021-10-09 ENCOUNTER — Ambulatory Visit (INDEPENDENT_AMBULATORY_CARE_PROVIDER_SITE_OTHER): Payer: Medicare Other

## 2021-10-09 VITALS — BP 130/85 | HR 65 | Temp 97.9°F

## 2021-10-09 DIAGNOSIS — L600 Ingrowing nail: Secondary | ICD-10-CM

## 2021-10-09 DIAGNOSIS — M7751 Other enthesopathy of right foot: Secondary | ICD-10-CM

## 2021-10-09 DIAGNOSIS — M79671 Pain in right foot: Secondary | ICD-10-CM

## 2021-10-09 DIAGNOSIS — B351 Tinea unguium: Secondary | ICD-10-CM

## 2021-10-12 NOTE — Progress Notes (Signed)
Subjective:   Patient ID: Donna Guerrero, female   DOB: 81 y.o.   MRN: [id number]   HPI 81 year old female presents the office today for concerns of discomfort to the arch of the foot it looks that there is a bone protruding.  She states that since changing shoes this sensation has been getting better.  This started about 1 year ago without any injuries.  She states that she stands a lot taking care of her husband and she is a former Medical laboratory scientific officer.  She states that the pain in her feet aggravate her symptoms.  She points on the fifth metatarsal base.  She has occasionally gets ingrown toenails and she gets pedicures on a routine basis.  She is asking if she should come to the office to get them cut out other nails to get thick and discolored as well she is not able to trim them herself.   Review of Systems  All other systems reviewed and are negative.  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 Please make overdue appt with Dr. Irish Lack before anymore refills. 1st attempt, Disp: 30 tablet, Rfl: 0   dorzolamide (TRUSOPT) 2 % ophthalmic solution, Place 1 drop into the left eye 2 (two) times daily., Disp: , Rfl:    glucose blood test strip, 1 each by Other route as needed for other. Use as instructed, Disp: , Rfl:    JARDIANCE 25 MG TABS tablet, Take 25 mg  by mouth daily., Disp: , Rfl:    latanoprost (XALATAN) 0.005 % ophthalmic solution, Place 1 drop into both eyes at bedtime. , Disp: , Rfl:    levothyroxine (SYNTHROID, LEVOTHROID) 137 MCG tablet, Take 112 mcg by mouth daily before breakfast. Reduced to 125 mcg, Disp: , Rfl:    lisinopril (PRINIVIL,ZESTRIL) 40 MG tablet, Take 1 tablet (40 mg total) by mouth daily., Disp: 90 tablet, Rfl: 1   naproxen sodium (ALEVE) 220 MG tablet, Take by mouth as needed., Disp: , Rfl:    potassium chloride SA (K-DUR,KLOR-CON) 20 MEQ tablet, Take 1 tablet (20 mEq total) by mouth daily., Disp: 90 tablet, Rfl: 3   propranolol (INDERAL) 40 MG tablet, Take 1 tablet (40 mg total) by mouth daily. Please make overdue appt with Dr. Irish Lack before anymore refills. 1st attempt, Disp: 30 tablet, Rfl: 0   SYNTHROID 88 MCG tablet, Take 88 mcg by mouth every morning., Disp: , Rfl:    VYZULTA 0.024 % SOLN, Apply 1 drop to eye at bedtime., Disp: , Rfl:   Allergies  Allergen Reactions   Welchol [Colesevelam Hcl] Other (See Comments)    Tablets were too big, CAUSED FLU-LIKE SYMPTOMS   Amaryl [Glimepiride] Other (See Comments)  headache   Atorvastatin     MYALGIAS   Codeine Nausea Only   Crestor [Rosuvastatin]     Myalgias   Simvastatin Nausea Only          Objective:  Physical Exam  General: AAO x3, NAD  Dermatological: Nails are mildly hypertrophic and dystrophic with yellow-brown discoloration.  She recently had a pedicure and there is no significant ingrown toenail there is no pain in the nails or any signs of infection.  No open lesions.  Vascular: Dorsalis Pedis artery and Posterior Tibial artery pedal pulses are 2/4 bilateral with immedate capillary fill time.  There is no pain with calf compression, swelling, warmth, erythema.   Neruologic: Grossly intact via light touch bilateral.  Negative Tinel sign.  Musculoskeletal: Mild decrease in medial arch height upon weightbearing.  Positive fifth metatarsal  base.  There is no area of pinpoint tenderness identified to the area today or tenderness at the metatarsal base.  No pain on the course of peroneal tendon.  Muscular strength 5/5 in all groups tested bilateral.  Gait: Unassisted, Nonantalgic.       Assessment:   Fifth metatarsal base pain, pronation; ingrown nail/symptomatic onychomycosis      Plan:  -Treatment options discussed including all alternatives, risks, and complications -Etiology of symptoms were discussed -X-rays were obtained and reviewed with the patient.  There is no evidence of acute fracture or stress fracture. -Changing shoes with good arch support is been beneficial for her.  Continue with this.  Discussed material to the shoe to avoid any excess pressure.  Voltaren gel or topical medications can be useful as well to help decrease the discomfort.  -Recent pedicure.  If she wishes we can do the nail trimmed for her.  Discussed partial nail avulsions if needed.  Trula Slade DPM

## 2021-10-16 DIAGNOSIS — H401123 Primary open-angle glaucoma, left eye, severe stage: Secondary | ICD-10-CM | POA: Diagnosis not present

## 2021-11-04 ENCOUNTER — Other Ambulatory Visit: Payer: Self-pay | Admitting: Podiatry

## 2021-11-04 DIAGNOSIS — M7751 Other enthesopathy of right foot: Secondary | ICD-10-CM

## 2022-01-06 DIAGNOSIS — E1169 Type 2 diabetes mellitus with other specified complication: Secondary | ICD-10-CM | POA: Diagnosis not present

## 2022-01-06 DIAGNOSIS — I10 Essential (primary) hypertension: Secondary | ICD-10-CM | POA: Diagnosis not present

## 2022-01-06 DIAGNOSIS — Z79899 Other long term (current) drug therapy: Secondary | ICD-10-CM | POA: Diagnosis not present

## 2022-03-27 DIAGNOSIS — H401112 Primary open-angle glaucoma, right eye, moderate stage: Secondary | ICD-10-CM | POA: Diagnosis not present

## 2022-03-27 DIAGNOSIS — Z9883 Filtering (vitreous) bleb after glaucoma surgery status: Secondary | ICD-10-CM | POA: Diagnosis not present

## 2022-03-27 DIAGNOSIS — E119 Type 2 diabetes mellitus without complications: Secondary | ICD-10-CM | POA: Diagnosis not present

## 2022-03-27 DIAGNOSIS — H401123 Primary open-angle glaucoma, left eye, severe stage: Secondary | ICD-10-CM | POA: Diagnosis not present

## 2022-04-23 DIAGNOSIS — H93293 Other abnormal auditory perceptions, bilateral: Secondary | ICD-10-CM | POA: Diagnosis not present

## 2022-04-23 DIAGNOSIS — H6123 Impacted cerumen, bilateral: Secondary | ICD-10-CM | POA: Insufficient documentation

## 2022-06-19 DIAGNOSIS — Z0000 Encounter for general adult medical examination without abnormal findings: Secondary | ICD-10-CM | POA: Diagnosis not present

## 2022-06-19 DIAGNOSIS — E1169 Type 2 diabetes mellitus with other specified complication: Secondary | ICD-10-CM | POA: Diagnosis not present

## 2022-06-19 DIAGNOSIS — I10 Essential (primary) hypertension: Secondary | ICD-10-CM | POA: Diagnosis not present

## 2022-06-19 DIAGNOSIS — E039 Hypothyroidism, unspecified: Secondary | ICD-10-CM | POA: Diagnosis not present

## 2022-06-19 DIAGNOSIS — E782 Mixed hyperlipidemia: Secondary | ICD-10-CM | POA: Diagnosis not present

## 2022-06-19 DIAGNOSIS — Z79899 Other long term (current) drug therapy: Secondary | ICD-10-CM | POA: Diagnosis not present

## 2022-06-29 DIAGNOSIS — H401123 Primary open-angle glaucoma, left eye, severe stage: Secondary | ICD-10-CM | POA: Diagnosis not present

## 2022-06-29 DIAGNOSIS — Z9883 Filtering (vitreous) bleb after glaucoma surgery status: Secondary | ICD-10-CM | POA: Diagnosis not present

## 2022-06-29 DIAGNOSIS — E119 Type 2 diabetes mellitus without complications: Secondary | ICD-10-CM | POA: Diagnosis not present

## 2022-06-29 DIAGNOSIS — H401112 Primary open-angle glaucoma, right eye, moderate stage: Secondary | ICD-10-CM | POA: Diagnosis not present

## 2022-07-07 DIAGNOSIS — Z1231 Encounter for screening mammogram for malignant neoplasm of breast: Secondary | ICD-10-CM | POA: Diagnosis not present

## 2022-09-17 DIAGNOSIS — E1169 Type 2 diabetes mellitus with other specified complication: Secondary | ICD-10-CM | POA: Diagnosis not present

## 2022-09-17 DIAGNOSIS — I10 Essential (primary) hypertension: Secondary | ICD-10-CM | POA: Diagnosis not present

## 2022-09-17 DIAGNOSIS — Z23 Encounter for immunization: Secondary | ICD-10-CM | POA: Diagnosis not present

## 2022-09-17 DIAGNOSIS — E039 Hypothyroidism, unspecified: Secondary | ICD-10-CM | POA: Diagnosis not present

## 2022-10-26 ENCOUNTER — Ambulatory Visit: Payer: Medicare Other | Admitting: Podiatry

## 2022-10-26 DIAGNOSIS — E119 Type 2 diabetes mellitus without complications: Secondary | ICD-10-CM | POA: Diagnosis not present

## 2022-10-26 DIAGNOSIS — H401123 Primary open-angle glaucoma, left eye, severe stage: Secondary | ICD-10-CM | POA: Diagnosis not present

## 2022-10-26 DIAGNOSIS — H401112 Primary open-angle glaucoma, right eye, moderate stage: Secondary | ICD-10-CM | POA: Diagnosis not present

## 2022-11-10 ENCOUNTER — Ambulatory Visit: Payer: Medicare Other | Admitting: Podiatry

## 2022-11-10 DIAGNOSIS — B351 Tinea unguium: Secondary | ICD-10-CM | POA: Diagnosis not present

## 2022-11-10 DIAGNOSIS — L600 Ingrowing nail: Secondary | ICD-10-CM

## 2022-11-10 NOTE — Progress Notes (Signed)
Subjective: Chief Complaint  Patient presents with   Ingrown Toenail    Right foot, great hallux, DFC, callouse on right foot, ball of foot     82 year old female presents with above concerns.  She has been going to the nail salon to get a nail trim. She has been having issues with the right big toenail. She wants to make sure the nail is not getting damaged. She only gets pain to the tip of the toenail at this time. It is hard to trim her nails herself so she gets pedicures. She also gets a "buildup" in the ball ofhte right foot htat she "rubs" off herself. It can feel like a rock at times she points to sub 2 on the right.   Her sugar has been a little high but not where she needs any "shots".   Objective: AAO x3, NAD DP/PT pulses palpable bilaterally, CRT less than 3 seconds Nails are mildly hypertrophic and there is white discoloration of the toenails.  Mild incurvation of the hallux toenails.  There is no edema, erythema or signs of infection.  There are no open lesions.  Hyperkeratotic lesion submetatarsal head but without any underlying ulceration drainage or signs of infection. No pain with calf compression, swelling, warmth, erythema  Assessment: Onychodystrophy  Plan: -All treatment options discussed with the patient including all alternatives, risks, complications.  -I did debride the corners of the nail with any complications or bleeding.  Likely signs of infection or ingrowing of the nail.  Unable to do this with the nails and the discoloration improved. -Glucose control. -Daily foot inspection. -Patient encouraged to call the office with any questions, concerns, change in symptoms.   Trula Slade DPM

## 2022-11-10 NOTE — Patient Instructions (Signed)

## 2022-12-14 DIAGNOSIS — E039 Hypothyroidism, unspecified: Secondary | ICD-10-CM | POA: Diagnosis not present

## 2022-12-14 DIAGNOSIS — E1169 Type 2 diabetes mellitus with other specified complication: Secondary | ICD-10-CM | POA: Diagnosis not present

## 2023-02-01 DIAGNOSIS — H401133 Primary open-angle glaucoma, bilateral, severe stage: Secondary | ICD-10-CM | POA: Diagnosis not present

## 2023-02-01 DIAGNOSIS — E119 Type 2 diabetes mellitus without complications: Secondary | ICD-10-CM | POA: Diagnosis not present

## 2023-02-09 DIAGNOSIS — E1169 Type 2 diabetes mellitus with other specified complication: Secondary | ICD-10-CM | POA: Diagnosis not present

## 2023-02-09 DIAGNOSIS — E1165 Type 2 diabetes mellitus with hyperglycemia: Secondary | ICD-10-CM | POA: Diagnosis not present

## 2023-02-09 DIAGNOSIS — G250 Essential tremor: Secondary | ICD-10-CM | POA: Diagnosis not present

## 2023-02-09 DIAGNOSIS — R2231 Localized swelling, mass and lump, right upper limb: Secondary | ICD-10-CM | POA: Diagnosis not present

## 2023-02-09 DIAGNOSIS — E1121 Type 2 diabetes mellitus with diabetic nephropathy: Secondary | ICD-10-CM | POA: Diagnosis not present

## 2023-02-09 DIAGNOSIS — R0789 Other chest pain: Secondary | ICD-10-CM | POA: Diagnosis not present

## 2023-02-17 DIAGNOSIS — R2231 Localized swelling, mass and lump, right upper limb: Secondary | ICD-10-CM | POA: Diagnosis not present

## 2023-03-26 DIAGNOSIS — E1165 Type 2 diabetes mellitus with hyperglycemia: Secondary | ICD-10-CM | POA: Diagnosis not present

## 2023-03-26 DIAGNOSIS — E1121 Type 2 diabetes mellitus with diabetic nephropathy: Secondary | ICD-10-CM | POA: Diagnosis not present

## 2023-03-26 DIAGNOSIS — E1169 Type 2 diabetes mellitus with other specified complication: Secondary | ICD-10-CM | POA: Diagnosis not present

## 2023-04-09 DIAGNOSIS — E039 Hypothyroidism, unspecified: Secondary | ICD-10-CM | POA: Diagnosis not present

## 2023-04-09 DIAGNOSIS — E1122 Type 2 diabetes mellitus with diabetic chronic kidney disease: Secondary | ICD-10-CM | POA: Diagnosis not present

## 2023-04-09 DIAGNOSIS — E782 Mixed hyperlipidemia: Secondary | ICD-10-CM | POA: Diagnosis not present

## 2023-04-21 DIAGNOSIS — E1122 Type 2 diabetes mellitus with diabetic chronic kidney disease: Secondary | ICD-10-CM | POA: Diagnosis not present

## 2023-06-14 DIAGNOSIS — H401133 Primary open-angle glaucoma, bilateral, severe stage: Secondary | ICD-10-CM | POA: Diagnosis not present

## 2023-06-20 NOTE — Progress Notes (Unsigned)
Cardiology Office Note   Date:  06/21/2023   ID:  Donna Guerrero, DOB [birth year], MRN [id number]  PCP:  Donna Laughter, MD (Inactive) Dr. Margaretann Guerrero   No chief complaint on file.  Hypertension  Wt Readings from Last 3 Encounters:  06/21/23 137 lb 3.2 oz (62.2 kg)  06/04/20 141 lb (64 kg)  03/31/18 150 lb 12.8 oz (68.4 kg)       History of Present Illness: Donna Guerrero is a 82 y.o. female who I last saw in 2021 Who has had HTN, high cholesterol and DM.   Noted in 2021: "She has had difficulty tolerating medicines.   She has been on simvastatin and atorvastatin but had side effects from both. Crestor was started. She is now off of the crestor.  SHe is trying to change diet to improve cholesterol.   Hbg A1C was elevated so Amaryl was started. Amaryl was stopped as well. Metformin was started.  THis was stopped.  Januvia was tried as well without success.  She is changing her diet.   She has had eye issues in the past treated with drops.    She has had DOE noted in the past as well.    She is stressed from taking care of her husband with dementia, and is bed ridden.   She feels that she can feel her heartbeat with activity, and it is a somewhat uncomfortable.  Not really a pain, but more of a pressure sensation.  "  In 2021, CTA coronaries was suggested but not completed.  She is now seeing Dr. Margaretann Guerrero and Dr. Sharl Guerrero.   She reports tightness in her chest when she walks up a steep hill at times. Due to persistent sx, she was sent back for eval.    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     amLODipine (NORVASC) 2.5 MG tablet Take 2.5 mg by mouth daily.     chlorthalidone (HYGROTON) 25 MG tablet Take 1 tablet (25 mg total) by mouth daily. Please make overdue appt with Dr. Eldridge Dace before anymore refills. 1st attempt 30 tablet 0   dorzolamide (TRUSOPT) 2 % ophthalmic solution Place 1 drop into the left eye 2 (two) times daily.     dorzolamide-timolol (COSOPT) 2-0.5 % ophthalmic solution SMARTSIG:In Eye(s)     glucose blood test strip 1 each by Other route as needed for other. Use as instructed     latanoprost (XALATAN) 0.005 % ophthalmic solution Place 1 drop into both eyes at bedtime.      levothyroxine (SYNTHROID, LEVOTHROID) 137 MCG tablet Take 112 mcg by mouth daily before breakfast. Reduced to 125 mcg     lisinopril (PRINIVIL,ZESTRIL) 40 MG tablet Take 1 tablet (40 mg total) by mouth daily. 90 tablet 1   LIVALO 1 MG TABS Take by mouth.     naproxen sodium (ALEVE) 220 MG tablet Take  by mouth as needed.     potassium chloride SA (K-DUR,KLOR-CON) 20 MEQ tablet Take 1 tablet (20 mEq total) by mouth daily. 90 tablet 3   propranolol (INDERAL) 40 MG tablet Take 1 tablet (40 mg total) by mouth daily. Please make overdue appt with Dr. Eldridge Dace before anymore refills. 1st attempt 30 tablet 0   SYNTHROID 88 MCG tablet Take 88 mcg by mouth every morning.     VYZULTA 0.024 % SOLN Apply 1 drop to eye at bedtime.     No current facility-administered medications for this visit.    Allergies:   Welchol [colesevelam hcl], Amaryl [glimepiride], Atorvastatin, Codeine, Colesevelam, Crestor [rosuvastatin], Metformin hcl, and Simvastatin    Social History:  The patient  reports that she has never smoked. She has never used smokeless tobacco. She reports current alcohol use of about 2.0 standard drinks of alcohol per week. She reports that she does not use drugs.   Family History:  The  patient's family history includes CAD in her father; COPD in her brother; Heart attack in her father; Heart disease in her mother.    ROS:  Please see the history of present illness.   Otherwise, review of systems are positive for DOE.   All other systems are reviewed and negative.    PHYSICAL EXAM: VS:  BP 116/80   Pulse 68   Ht 5' (1.524 m)   Wt 137 lb 3.2 oz (62.2 kg)   SpO2 98%   BMI 26.80 kg/m  , BMI Body mass index is 26.8 kg/m. GEN: Well nourished, well developed, in no acute distress HEENT: normal Neck: no JVD, carotid bruits, or masses Cardiac: RRR; no murmurs, rubs, or gallops,no edema  Respiratory:  clear to auscultation bilaterally, normal work of breathing GI: soft, nontender, nondistended, + BS MS: no deformity or atrophy Skin: warm and dry, no rash Neuro:  Strength and sensation are intact Psych: euthymic mood, full affect   EKG:   The ekg ordered today demonstrates NSR, PVC, nonspecific ST changes   Recent Labs: No results found for requested labs within last 365 days.   Lipid Panel    Component Value Date/Time   CHOL 209 (H) 02/19/2014 0925   TRIG 81.0 02/19/2014 0925   HDL 64.10 02/19/2014 0925   CHOLHDL 3 02/19/2014 0925   VLDL 16.2 02/19/2014 0925   LDLCALC 129 (H) 02/19/2014 0925     Other studies Reviewed: Additional studies/ records that were reviewed today with results demonstrating: LDL 142 in 2022.   ASSESSMENT AND PLAN:  Hypertension: The current medical regimen is effective;  continue present plan and medications. Dyspnea on exertion, chest tightness: We discussed CTA again as we did 3 years ago.  Given RF for CAD and persistent DOE/tightness, plan for CTA.  Take extra propranolol 20 mg in AM before CT.   Diabetes: A1C 8.1.  Whole food, plant based diet. Whole food plant based diet Hyperlipidemia: statin intolerant   Current medicines are reviewed at length with the patient today.  The patient concerns regarding her medicines were  addressed.  The following changes have been made:  No change  Labs/ tests ordered today include: CTA  Orders Placed This Encounter  Procedures   EKG 12-Lead    Recommend 150 minutes/week of aerobic exercise Low fat, low carb, high fiber diet recommended  Disposition:   FU in based on CTA result   Signed, Lance Muss, MD  06/21/2023 2:01 PM    [facility] [address], [place], [place]  [address] Phone: [phone]; Fax: [phone]

## 2023-06-21 ENCOUNTER — Encounter: Payer: Self-pay | Admitting: Interventional Cardiology

## 2023-06-21 ENCOUNTER — Ambulatory Visit: Payer: Medicare Other | Attending: Interventional Cardiology | Admitting: Interventional Cardiology

## 2023-06-21 VITALS — BP 116/80 | HR 68 | Ht 60.0 in | Wt 137.2 lb

## 2023-06-21 DIAGNOSIS — E782 Mixed hyperlipidemia: Secondary | ICD-10-CM | POA: Diagnosis not present

## 2023-06-21 DIAGNOSIS — I10 Essential (primary) hypertension: Secondary | ICD-10-CM | POA: Diagnosis not present

## 2023-06-21 DIAGNOSIS — R072 Precordial pain: Secondary | ICD-10-CM | POA: Diagnosis not present

## 2023-06-21 DIAGNOSIS — E119 Type 2 diabetes mellitus without complications: Secondary | ICD-10-CM | POA: Diagnosis not present

## 2023-06-21 NOTE — Patient Instructions (Addendum)
Medication Instructions:  Your physician recommends that you continue on your current medications as directed. Please refer to the Current Medication list given to you today.  *If you need a refill on your cardiac medications before your next appointment, please call your pharmacy*   Lab Work: Lab work to be done today--BMP If you have labs (blood work) drawn today and your tests are completely normal, you will receive your results only by: MyChart Message (if you have MyChart) OR A paper copy in the mail If you have any lab test that is abnormal or we need to change your treatment, we will call you to review the results.   Testing/Procedures: Your physician has requested that you have cardiac CT. Cardiac computed tomography (CT) is a painless test that uses an x-ray machine to take clear, detailed pictures of your heart. For further information please visit [email or web address] Please follow instruction sheet as given.     Follow-Up: At [facility], you and your health needs are our priority.  As part of our continuing mission to provide you with exceptional heart care, we have created designated Provider Care Teams.  These Care Teams include your primary Cardiologist (physician) and Advanced Practice Providers (APPs -  Physician Assistants and Nurse Practitioners) who all work together to provide you with the care you need, when you need it.  We recommend signing up for the patient portal called "MyChart".  Sign up information is provided on this After Visit Summary.  MyChart is used to connect with patients for Virtual Visits (Telemedicine).  Patients are able to view lab/test results, encounter notes, upcoming appointments, etc.  Non-urgent messages can be sent to your provider as well.   To learn more about what you can do with MyChart, go to [email or web address].    Your next appointment:   12 month(s)  Provider:       Other Instructions Follow up in one year or  sooner based on test results    Your cardiac CT will be scheduled at one of the below locations:   [facility] [address] [place], [place] [address] [phone]  OR  [facility] [address] [place], [place] [address] [phone]  OR   [facility] [address] [place], [place] [address] [phone]  If scheduled at [facility], please arrive at the [facility] and Children's Entrance (Entrance C2) of [facility] 30 minutes prior to test start time. You can use the FREE valet parking offered at entrance C (encouraged to control the heart rate for the test)  Proceed to the [facility] Radiology Department (first floor) to check-in and test prep.  All radiology patients and guests should use entrance C2 at [facility], accessed from [facility], even though the hospital's physical address listed is [address].    If scheduled at [facility] or [facility], please arrive 15 mins early for check-in and test prep.  There is spacious parking and easy access to the radiology department from the [facility] entrance. Please enter here and check-in with the desk attendant.   Please follow these instructions carefully (unless otherwise directed):  An IV will be required for this test and Nitroglycerin will be given.    On the Night Before the Test: Be sure to Drink plenty of water. Do not consume any caffeinated/decaffeinated beverages or chocolate 12 hours prior to  your test. Do not take any antihistamines 12 hours prior to your test.   On the Day of the Test: Drink plenty of water until 1 hour prior to the test. Do not eat any food 1 hour prior to test. You may take your regular medications prior to the test.  Take your normal dose propranolol and extra half tablet 2 hours prior  to CT Scan If you take Furosemide/Hydrochlorothiazide/Spironolactone, please HOLD on the morning of the test. FEMALES- please wear underwire-free bra if available, avoid dresses & tight clothing          After the Test: Drink plenty of water. After receiving IV contrast, you may experience a mild flushed feeling. This is normal. On occasion, you may experience a mild rash up to 24 hours after the test. This is not dangerous. If this occurs, you can take Benadryl 25 mg and increase your fluid intake. If you experience trouble breathing, this can be serious. If it is severe call 911 IMMEDIATELY. If it is mild, please call our office. If you take any of these medications: Glipizide/Metformin, Avandament, Glucavance, please do not take 48 hours after completing test unless otherwise instructed.  We will call to schedule your test 2-4 weeks out understanding that some insurance companies will need an authorization prior to the service being performed.   For more information and frequently asked questions, please visit our website : [email or web address]  For non-scheduling related questions, please contact the cardiac imaging nurse navigator should you have any questions/concerns: Cardiac Imaging Nurse Navigators Direct Office Dial: [phone]   For scheduling needs, including cancellations and rescheduling, please call [place], [phone].

## 2023-06-22 LAB — BASIC METABOLIC PANEL
BUN/Creatinine Ratio: 26 (ref 12–28)
BUN: 24 mg/dL (ref 8–27)
CO2: 23 mmol/L (ref 20–29)
Calcium: 9.8 mg/dL (ref 8.7–10.3)
Chloride: 98 mmol/L (ref 96–106)
Creatinine, Ser: 0.93 mg/dL (ref 0.57–1.00)
Glucose: 138 mg/dL — ABNORMAL HIGH (ref 70–99)
Potassium: 4.1 mmol/L (ref 3.5–5.2)
Sodium: 138 mmol/L (ref 134–144)
eGFR: 62 mL/min/{1.73_m2} (ref >59)

## 2023-06-27 ENCOUNTER — Encounter (HOSPITAL_COMMUNITY): Payer: Self-pay

## 2023-06-29 ENCOUNTER — Telehealth (HOSPITAL_COMMUNITY): Payer: Self-pay | Admitting: *Deleted

## 2023-06-29 NOTE — Telephone Encounter (Signed)
Reaching out to patient to offer assistance regarding upcoming cardiac imaging study; pt verbalizes understanding of appt date/time, parking situation and where to check in, pre-test NPO status and verified current allergies; name and call back number provided for further questions should they arise  Larey Brick RN Navigator Cardiac Imaging [facility] [phone] office [phone] cell  Patient to take an additional 20mg  of propanolol two hours prior to her cardiac CT scan. She is aware to arrive at 1:30 PM.

## 2023-06-30 ENCOUNTER — Other Ambulatory Visit: Payer: Self-pay | Admitting: Internal Medicine

## 2023-06-30 ENCOUNTER — Ambulatory Visit (HOSPITAL_BASED_OUTPATIENT_CLINIC_OR_DEPARTMENT_OTHER)
Admission: RE | Admit: 2023-06-30 | Discharge: 2023-06-30 | Disposition: A | Discharge status: Home / Self Care | Payer: Medicare Other | Source: Ambulatory Visit | Attending: Internal Medicine | Admitting: Internal Medicine

## 2023-06-30 ENCOUNTER — Ambulatory Visit (HOSPITAL_COMMUNITY)
Admission: RE | Admit: 2023-06-30 | Discharge: 2023-06-30 | Disposition: A | Discharge status: Home / Self Care | Payer: Medicare Other | Source: Ambulatory Visit | Attending: Interventional Cardiology | Admitting: Interventional Cardiology

## 2023-06-30 DIAGNOSIS — R072 Precordial pain: Secondary | ICD-10-CM | POA: Diagnosis not present

## 2023-06-30 DIAGNOSIS — R931 Abnormal findings on diagnostic imaging of heart and coronary circulation: Secondary | ICD-10-CM | POA: Insufficient documentation

## 2023-06-30 DIAGNOSIS — I2510 Atherosclerotic heart disease of native coronary artery without angina pectoris: Secondary | ICD-10-CM | POA: Diagnosis not present

## 2023-06-30 MED ORDER — NITROGLYCERIN 0.4 MG SL SUBL
0.8000 mg | SUBLINGUAL_TABLET | Freq: Once | SUBLINGUAL | Status: AC
  Administered 2023-06-30: 0.8 mg via SUBLINGUAL

## 2023-06-30 MED ORDER — NITROGLYCERIN 0.4 MG SL SUBL
SUBLINGUAL_TABLET | SUBLINGUAL | Status: AC
  Filled 2023-06-30: qty 2

## 2023-06-30 MED ORDER — IOHEXOL 350 MG/ML SOLN
95.0000 mL | Freq: Once | INTRAVENOUS | Status: AC | PRN
  Administered 2023-06-30: 95 mL via INTRAVENOUS

## 2023-06-30 NOTE — Progress Notes (Signed)
Patient tolerated CT well. Vital signs stable encourage to drink water throughout day.Reasons explained and verbalized understanding. Ambulated steady gait.   

## 2023-07-01 ENCOUNTER — Telehealth: Payer: Self-pay | Admitting: Internal Medicine

## 2023-07-01 NOTE — Telephone Encounter (Signed)
Patient underwent Coronary CTA yesterday for chest pain, DOE.  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  This is a patient of my partner Dr. Eldridge Dace who is transitioning out of the practice.  I attempted to call the patient with the critical results, however she was not able to come to the phone and I left a message to call back and set up an appointment to discuss cardiac catheterization based on CT results.  I will route this message to [facility] nursing triage who can assist with making the appointment ASAP when the patient returns the call.

## 2023-07-01 NOTE — Telephone Encounter (Signed)
Spoke with pt regarding her coronary CTA from yesterday per Dr. Jacques Navy. She is a previous pt of Dr. Eldridge Dace, pt scheduled for first available DOD. Pt scheduled to see Dr. Izora Ribas on Monday. Pt verbalizes understanding.

## 2023-07-01 NOTE — Telephone Encounter (Signed)
Left message for pt to call back for an urgent office visit.

## 2023-07-05 ENCOUNTER — Encounter: Payer: Self-pay | Admitting: Internal Medicine

## 2023-07-05 ENCOUNTER — Ambulatory Visit: Payer: Medicare Other | Attending: Internal Medicine | Admitting: Internal Medicine

## 2023-07-05 VITALS — BP 116/68 | HR 63 | Ht 60.0 in | Wt 136.0 lb

## 2023-07-05 DIAGNOSIS — I25118 Atherosclerotic heart disease of native coronary artery with other forms of angina pectoris: Secondary | ICD-10-CM | POA: Insufficient documentation

## 2023-07-05 DIAGNOSIS — R931 Abnormal findings on diagnostic imaging of heart and coronary circulation: Secondary | ICD-10-CM | POA: Diagnosis not present

## 2023-07-05 DIAGNOSIS — R072 Precordial pain: Secondary | ICD-10-CM

## 2023-07-05 DIAGNOSIS — I10 Essential (primary) hypertension: Secondary | ICD-10-CM

## 2023-07-05 NOTE — Patient Instructions (Addendum)
Medication Instructions:  Your physician recommends that you continue on your current medications as directed. Please refer to the Current Medication list given to you today.  *If you need a refill on your cardiac medications before your next appointment, please call your pharmacy*   Lab Work: CBC If you have labs (blood work) drawn today and your tests are completely normal, you will receive your results only by: MyChart Message (if you have MyChart) OR A paper copy in the mail If you have any lab test that is abnormal or we need to change your treatment, we will call you to review the results.   Testing/Procedures: Your physician has requested that you have a cardiac catheterization. Cardiac catheterization is used to diagnose and/or treat various heart conditions. Doctors may recommend this procedure for a number of different reasons. The most common reason is to evaluate chest pain. Chest pain can be a symptom of coronary artery disease (CAD), and cardiac catheterization can show whether plaque is narrowing or blocking your heart's arteries. This procedure is also used to evaluate the valves, as well as measure the blood flow and oxygen levels in different parts of your heart. For further information please visit [email or web address] Please follow instruction sheet, as given.    Follow-Up: At [facility], you and your health needs are our priority.  As part of our continuing mission to provide you with exceptional heart care, we have created designated Provider Care Teams.  These Care Teams include your primary Cardiologist (physician) and Advanced Practice Providers (APPs -  Physician Assistants and Nurse Practitioners) who all work together to provide you with the care you need, when you need it.  We recommend signing up for the patient portal called "MyChart".  Sign up information is provided on this After Visit Summary.  MyChart is used to connect with patients for Virtual  Visits (Telemedicine).  Patients are able to view lab/test results, encounter notes, upcoming appointments, etc.  Non-urgent messages can be sent to your provider as well.   To learn more about what you can do with MyChart, go to [email or web address].    Your next appointment:   2 month(s)  Provider:   Christell Constant, MD  or APP      Other Instructions  You are scheduled for a Cardiac Catheterization on Wednesday, October 2 with Dr. Peter Swaziland.  1. Please arrive at the [facility] (Main Entrance A) at [facility]: [address] [place], [place] [address] at 8:00 AM (This time is 2 hour(s) before your procedure to ensure your preparation). Free valet parking service is available. You will check in at ADMITTING. The support person will be asked to wait in the waiting room.  It is OK to have someone drop you off and come back when you are ready to be discharged.    Special note: Every effort is made to have your procedure done on time. Please understand that emergencies sometimes delay scheduled procedures.  2. Diet: Do not eat solid foods after midnight.  The patient may have clear liquids until 5am upon the day of the procedure.  3. Labs: TODAY  4. Medication instructions in preparation for your procedure:   Contrast Allergy: No    Do not take Chlorthalidone the morning of Heart Cath.    On the morning of your procedure, take your Aspirin 81 mg and any morning medicines NOT listed above.  You may use sips of water.  5. Plan to go home the  same day, you will only stay overnight if medically necessary. 6. Bring a current list of your medications and current insurance cards. 7. You MUST have a responsible person to drive you home. 8. Someone MUST be with you the first 24 hours after you arrive home or your discharge will be delayed. 9. Please wear clothes that are easy to get on and off and wear slip-on shoes.  Thank you for allowing [place] to care for you!   --  [facility]

## 2023-07-05 NOTE — H&P (View-Only) (Signed)
Cardiology Office Note:  .    Date:  07/05/2023  ID:  Donna Guerrero, DOB 25-Apr-1941, MRN [id number] PCP: Merlene Laughter, MD (Inactive)  [facility] Providers Cardiologist:  Christell Constant, MD     CC: DOD high risk CAD findings; Transition to new cardiologist  History of Present Illness: .    Donna Guerrero is a 82 y.o. female patient with HTN, and HLD who is being assessed for CAD.  As part of Dr. Hoyle Barr evaluation, a Cardiac CT was performed.  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  Discussed the use of AI scribe software for clinical note transcription with the patient, who gave verbal consent to proceed.  History of Present Illness         Donna Guerrero, an 82 year old with a history of hypertension, hyperlipidemia, and benign tremors, presents for a follow-up visit after a recent cardiac CT scan. The scan revealed severe LAD stenosis and multivessel disease. Despite these findings, the patient reports feeling generally well and continues to be active, participating in activities such as line dancing (she was at this when she got the phone call about her results)). 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 The patient has a desire to remain active and expresses anxiety about potential heart issues.   Relevant histories: .  Social: former [place] patient, comes with daughter, she has a son who lives in [place] - former ballerina, she is hoping to go to [employer] in 07/16/23 ROS: As per HPI.   Studies Reviewed: .   Cardiac Studies & Procedures     STRESS TESTS  MYOCARDIAL PERFUSION IMAGING 04/12/2015  Narrative  Nuclear stress EF: 70%.  The study is normal.  This is a low risk study.  The left ventricular ejection fraction is hyperdynamic (>65%).      CT SCANS  CT CORONARY MORPH W/CTA COR W/SCORE 06/30/2023  Narrative HISTORY: Chest  pain, nonspecific  EXAM: Cardiac/Coronary  CT  TECHNIQUE: The patient was scanned on a [employer].  PROTOCOL: A 120 kV prospective scan was triggered in the descending thoracic aorta at 111 HU's. Axial non-contrast 3 mm slices were carried out through the heart. The data set was analyzed on a dedicated work station and scored using the Agatston method. Gantry rotation speed was 250 msecs and collimation was .6 mm. Beta blockade and 0.8 mg of sl NTG was given. The 3D data set was reconstructed in 5% intervals of the 35-75 % of the R-R cycle. Systolic and diastolic phases were analyzed on a dedicated work station using MPR, MIP and VRT modes. The patient received contrast: 95mL OMNIPAQUE IOHEXOL 350 MG/ML SOLN.  FINDINGS: Image quality: Poor  Noise artifact is: Reduced signal to noise and calcium blooming.  Coronary calcium score is 899, which places the patient in the 88th percentile for age and sex matched control.  Coronary arteries: Normal coronary origins.  Right dominance.  Right Coronary Artery: Moderate mixed plaque in the proximal RCA with positive remodeling, 50-69% stenosis. Concern for severe mixed plaque at the distal RCA at the bifurcation of PDA and PLA, with severe mixed plaque in the proximal PLA, 70-99% stenosis.  Left Main Coronary Artery: Mild mixed atherosclerotic plaque at the distal LM, 25-49% stenosis. This extends into a severe mixed plaque in the ostial LAD.  Left Anterior Descending Coronary Artery: Severe ostial LAD mixed plaque, 70-99% stenosis. Concern for left  main equivalent disease. Severe mixed plaque in the proximal LAD, 70-99% stenosis. Scattered moderate plaque in the mid vessel, 50-69% stenosis. Possible severe stenosis in the ostial first diagonal, 70-99% stenosis.  Left Circumflex Artery: Severe mixed plaque in the proximal LCx.  Aorta: Normal size, 33 mm at the mid ascending aorta (level of the PA bifurcation) measured  double oblique.  Aortic Valve: Trivial calcifications, mild leaflet thickening. AV calcium score 72.  Other findings:  Normal pulmonary vein drainage into the left atrium.  Normal left atrial appendage without thrombus.  Normal size of the pulmonary artery.  Please see separate report from [facility] for non-cardiac findings.  IMPRESSION: 1. Severe ostial LAD stenosis, with concern for severe lesions in the proximal LCx, first diagonal artery, and distal RCA/proximal PLA. 70-99% stenosis, CADRADS 4. Consider cardiac catheterization with concern for left main equivalent disease.  2. CT FFR will be performed and reported separately.  3. Total plaque volume 635 mm3 which is 65th percentile for age- and sex-matched controls (calcified plaque 103 mm3; non-calcified plaque 532 mm3). 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  4. Coronary calcium score is 899, which places the patient in the 88th percentile for age and sex matched control.  5. Normal coronary origins with right dominance.  RECOMMENDATIONS: CAD-RADS 4 Severe stenosis. (70-99% or > 50% left main). Cardiac catheterization or CT FFR is recommended. Consider symptom-guided anti-ischemic pharmacotherapy as well as risk factor modification per guideline directed care.   Electronically Signed By: Weston Brass M.D. On: 07/01/2023 11:20           Physical Exam:    VS:  BP 116/68   Pulse 63   Ht 5' (1.524 m)   Wt 136 lb (61.7 kg)   SpO2 97%   BMI 26.56 kg/m    Wt Readings from Last 3 Encounters:  07/05/23 136 lb (61.7 kg)  06/21/23 137 lb 3.2 oz (62.2 kg)  06/04/20 141 lb (64 kg)    Gen: no distress, elderly female   Neck: No JVD Cardiac: IRIR, No Rubs or Gallops, no murmur, +2 radial pulses Respiratory: Clear to auscultation bilaterally, normal effort, normal  respiratory rate GI: Soft, nontender, non-distended  MS: No  edema;  moves all extremities Integument: Skin feels warm Neuro:  At time of evaluation, alert  and oriented to person/place/time/situation  Psych: Normal affect, patient feels well  ASSESSMENT AND PLAN: .    Coronary Artery Disease - Severe stenosis in the LAD, first diagonal, and RCA as per cardiac CT. Presence of low attenuation plaque in the left main and LAD. Patient reports minimal symptoms, mainly slight chest tightness during uphill walking. - ASA 81 mg PO daily start - continue propranolol  Risks and benefits of cardiac catheterization have been discussed with the patient.  These include bleeding, infection, kidney damage, stroke, heart attack, death.  The patient understands these risks and is willing to proceed.  Access recommendations: R radial  Procedural considerations: low attenuation   Premature Ventricular Contractions - Noted on EKG and auscultation. 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 -Continue Propranolol; She has frequent PVCs today and may need increased dose or AV nodal therapy - I had ordered an EKG today; she has deferred  Benign Tremors Managed with Propranolol. -Continue Propranolol.  Hypertension and Hyperlipidemia -Continue current management. - she has a statin myalgia but given her disease we will need to consider novel agents   Time Spent Directly with Patient:   I have spent a total of 60 minutes with  the patient reviewing notes, imaging, EKGs, labs and examining the patient as well as establishing an assessment and plan that was discussed personally with the patient.  > 50% of time was spent in direct patient care and family and reviewing imaging with patient.   Riley Lam, MD FASE [facility] Cardiologist [facility]  [address] [place], [address] [place], [place] [address] [phone]  4:03 PM

## 2023-07-05 NOTE — Progress Notes (Signed)
Cardiology Office Note:  .    Date:  07/05/2023  ID:  Donna Guerrero 25-Apr-1941, MRN [id number] PCP: Donna Laughter, MD (Inactive)  [facility] Providers Cardiologist:  Donna Constant, MD     CC: DOD high risk CAD findings; Transition to new cardiologist  History of Present Illness: .    Donna Guerrero is a 82 y.o. female patient with HTN, and HLD who is being assessed for CAD.  As part of [place] Guerrero evaluation, a Cardiac CT was performed.  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  Discussed the use of AI scribe software for clinical note transcription with the patient, who gave verbal consent to proceed.  History of Present Illness         Donna Guerrero, an 82 year old with a history of hypertension, hyperlipidemia, and benign tremors, presents for a follow-up visit after a recent cardiac CT scan. The scan revealed severe LAD stenosis and multivessel disease. Despite these findings, the patient reports feeling generally well and continues to be active, participating in activities such as line dancing (she was at this when she got the phone call about her results)). 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 The patient has a desire to remain active and expresses anxiety about potential heart issues.   Relevant histories: .  Social: former [place] patient, comes with daughter, she has a son who lives in [place] - former ballerina, she is hoping to go to [employer] in 07/16/23 ROS: As per HPI.   Studies Reviewed: .   Cardiac Studies & Procedures     STRESS TESTS  MYOCARDIAL PERFUSION IMAGING 04/12/2015  Narrative  Nuclear stress EF: 70%.  The study is normal.  This is a low risk study.  The left ventricular ejection fraction is hyperdynamic (>65%).      CT SCANS  CT CORONARY MORPH W/CTA COR W/SCORE 06/30/2023  Narrative HISTORY: Chest  pain, nonspecific  EXAM: Cardiac/Coronary  CT  TECHNIQUE: The patient was scanned on a [employer].  PROTOCOL: A 120 kV prospective scan was triggered in the descending thoracic aorta at 111 HU's. Axial non-contrast 3 mm slices were carried out through the heart. The data set was analyzed on a dedicated work station and scored using the Agatston method. Gantry rotation speed was 250 msecs and collimation was .6 mm. Beta blockade and 0.8 mg of sl NTG was given. The 3D data set was reconstructed in 5% intervals of the 35-75 % of the R-R cycle. Systolic and diastolic phases were analyzed on a dedicated work station using MPR, MIP and VRT modes. The patient received contrast: 95mL OMNIPAQUE IOHEXOL 350 MG/ML SOLN.  FINDINGS: Image quality: Poor  Noise artifact is: Reduced signal to noise and calcium blooming.  Coronary calcium score is 899, which places the patient in the 88th percentile for age and sex matched control.  Coronary arteries: Normal coronary origins.  Right dominance.  Right Coronary Artery: Moderate mixed plaque in the proximal RCA with positive remodeling, 50-69% stenosis. Concern for severe mixed plaque at the distal RCA at the bifurcation of PDA and PLA, with severe mixed plaque in the proximal PLA, 70-99% stenosis.  Left Main Coronary Artery: Mild mixed atherosclerotic plaque at the distal LM, 25-49% stenosis. This extends into a severe mixed plaque in the ostial LAD.  Left Anterior Descending Coronary Artery: Severe ostial LAD mixed plaque, 70-99% stenosis. Concern for left  main equivalent disease. Severe mixed plaque in the proximal LAD, 70-99% stenosis. Scattered moderate plaque in the mid vessel, 50-69% stenosis. Possible severe stenosis in the ostial first diagonal, 70-99% stenosis.  Left Circumflex Artery: Severe mixed plaque in the proximal LCx.  Aorta: Normal size, 33 mm at the mid ascending aorta (level of the PA bifurcation) measured  double oblique.  Aortic Valve: Trivial calcifications, mild leaflet thickening. AV calcium score 72.  Other findings:  Normal pulmonary vein drainage into the left atrium.  Normal left atrial appendage without thrombus.  Normal size of the pulmonary artery.  Please see separate report from [facility] for non-cardiac findings.  IMPRESSION: 1. Severe ostial LAD stenosis, with concern for severe lesions in the proximal LCx, first diagonal artery, and distal RCA/proximal PLA. 70-99% stenosis, CADRADS 4. Consider cardiac catheterization with concern for left main equivalent disease.  2. CT FFR will be performed and reported separately.  3. Total plaque volume 635 mm3 which is 65th percentile for age- and sex-matched controls (calcified plaque 103 mm3; non-calcified plaque 532 mm3). 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  4. Coronary calcium score is 899, which places the patient in the 88th percentile for age and sex matched control.  5. Normal coronary origins with right dominance.  RECOMMENDATIONS: CAD-RADS 4 Severe stenosis. (70-99% or > 50% left main). Cardiac catheterization or CT FFR is recommended. Consider symptom-guided anti-ischemic pharmacotherapy as well as risk factor modification per guideline directed care.   Electronically Signed By: [place] M.D. On: 07/01/2023 11:20           Physical Exam:    VS:  BP 116/68   Pulse 63   Ht 5' (1.524 m)   Wt 136 lb (61.7 kg)   SpO2 97%   BMI 26.56 kg/m    Wt Readings from Last 3 Encounters:  07/05/23 136 lb (61.7 kg)  06/21/23 137 lb 3.2 oz (62.2 kg)  06/04/20 141 lb (64 kg)    Gen: no distress, elderly female   Neck: No JVD Cardiac: IRIR, No Rubs or Gallops, no murmur, +2 radial pulses Respiratory: Clear to auscultation bilaterally, normal effort, normal  respiratory rate GI: Soft, nontender, non-distended  MS: No  edema;  moves all extremities Integument: Skin feels warm Neuro:  At time of evaluation, alert  and oriented to person/place/time/situation  Psych: Normal affect, patient feels well  ASSESSMENT AND PLAN: .    Coronary Artery Disease - Severe stenosis in the LAD, first diagonal, and RCA as per cardiac CT. Presence of low attenuation plaque in the left main and LAD. Patient reports minimal symptoms, mainly slight chest tightness during uphill walking. - ASA 81 mg PO daily start - continue propranolol  Risks and benefits of cardiac catheterization have been discussed with the patient.  These include bleeding, infection, kidney damage, stroke, heart attack, death.  The patient understands these risks and is willing to proceed.  Access recommendations: R radial  Procedural considerations: low attenuation   Premature Ventricular Contractions - Noted on EKG and auscultation. 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 -Continue Propranolol; She has frequent PVCs today and may need increased dose or AV nodal therapy - I had ordered an EKG today; she has deferred  Benign Tremors Managed with Propranolol. -Continue Propranolol.  Hypertension and Hyperlipidemia -Continue current management. - she has a statin myalgia but given her disease we will need to consider novel agents   Time Spent Directly with Patient:   I have spent a total of 60 minutes with  the patient reviewing notes, imaging, EKGs, labs and examining the patient as well as establishing an assessment and plan that was discussed personally with the patient.  > 50% of time was spent in direct patient care and family and reviewing imaging with patient.   Riley Lam, MD FASE [facility] Cardiologist [facility]  [address] [place], [address] [place], [place] [address] [phone]  4:03 PM

## 2023-07-06 ENCOUNTER — Telehealth: Payer: Self-pay | Admitting: *Deleted

## 2023-07-06 LAB — CBC
Hematocrit: 42.4 % (ref 34.0–46.6)
Hemoglobin: 13.5 g/dL (ref 11.1–15.9)
MCH: 30.5 pg (ref 26.6–33.0)
MCHC: 31.8 g/dL (ref 31.5–35.7)
MCV: 96 fL (ref 79–97)
Platelets: 254 10*3/uL (ref 150–450)
RBC: 4.42 x10E6/uL (ref 3.77–5.28)
RDW: 12.5 % (ref 11.7–15.4)
WBC: 6.3 10*3/uL (ref 3.4–10.8)

## 2023-07-06 NOTE — Telephone Encounter (Addendum)
Cardiac Catheterization scheduled at [facility] for: Wednesday July 07, 2023 10 AM Arrival time [facility] Main Entrance A at: 8 AM  Nothing to eat after midnight prior to procedure, clear liquids until 5 AM day of procedure.  Medication instructions: -Hold:  Chlorthalidone/KCl-AM of procedure -Other usual morning medications can be taken with sips of water including aspirin 81 mg.  Plan to go home the same day, you will only stay overnight if medically necessary.  You must have responsible adult to drive you home.  Someone must be with you the first 24 hours after you arrive home.  Reviewed procedure instructions with patient.

## 2023-07-07 ENCOUNTER — Telehealth: Payer: Self-pay | Admitting: Internal Medicine

## 2023-07-07 ENCOUNTER — Ambulatory Visit (HOSPITAL_COMMUNITY)
Admission: RE | Admit: 2023-07-07 | Discharge: 2023-07-07 | Disposition: A | Discharge status: Home / Self Care | Payer: Medicare Other | Attending: Cardiology | Admitting: Cardiology

## 2023-07-07 ENCOUNTER — Other Ambulatory Visit: Payer: Self-pay

## 2023-07-07 ENCOUNTER — Encounter (HOSPITAL_COMMUNITY)
Admission: RE | Disposition: A | Discharge status: Home / Self Care | Payer: Self-pay | Source: Home / Self Care | Attending: Cardiology

## 2023-07-07 ENCOUNTER — Other Ambulatory Visit: Payer: Self-pay | Admitting: Cardiology

## 2023-07-07 DIAGNOSIS — I2584 Coronary atherosclerosis due to calcified coronary lesion: Secondary | ICD-10-CM | POA: Insufficient documentation

## 2023-07-07 DIAGNOSIS — I25118 Atherosclerotic heart disease of native coronary artery with other forms of angina pectoris: Secondary | ICD-10-CM | POA: Diagnosis not present

## 2023-07-07 DIAGNOSIS — E785 Hyperlipidemia, unspecified: Secondary | ICD-10-CM | POA: Diagnosis not present

## 2023-07-07 DIAGNOSIS — I10 Essential (primary) hypertension: Secondary | ICD-10-CM | POA: Diagnosis not present

## 2023-07-07 DIAGNOSIS — Z79899 Other long term (current) drug therapy: Secondary | ICD-10-CM | POA: Diagnosis not present

## 2023-07-07 DIAGNOSIS — I493 Ventricular premature depolarization: Secondary | ICD-10-CM | POA: Insufficient documentation

## 2023-07-07 DIAGNOSIS — R251 Tremor, unspecified: Secondary | ICD-10-CM | POA: Diagnosis not present

## 2023-07-07 DIAGNOSIS — R072 Precordial pain: Secondary | ICD-10-CM

## 2023-07-07 DIAGNOSIS — Z01818 Encounter for other preprocedural examination: Secondary | ICD-10-CM

## 2023-07-07 HISTORY — PX: LEFT HEART CATH AND CORONARY ANGIOGRAPHY: CATH118249

## 2023-07-07 LAB — GLUCOSE, CAPILLARY
Glucose-Capillary: 168 mg/dL — ABNORMAL HIGH (ref 70–99)
Glucose-Capillary: 146 mg/dL — ABNORMAL HIGH (ref 70–99)

## 2023-07-07 SURGERY — LEFT HEART CATH AND CORONARY ANGIOGRAPHY
Anesthesia: LOCAL

## 2023-07-07 MED ORDER — FENTANYL CITRATE (PF) 100 MCG/2ML IJ SOLN
INTRAMUSCULAR | Status: DC | PRN
  Administered 2023-07-07: 25 ug via INTRAVENOUS

## 2023-07-07 MED ORDER — VERAPAMIL HCL 2.5 MG/ML IV SOLN
INTRAVENOUS | Status: DC | PRN
  Administered 2023-07-07: 10 mL via INTRA_ARTERIAL

## 2023-07-07 MED ORDER — LIDOCAINE HCL (PF) 1 % IJ SOLN
INTRAMUSCULAR | Status: DC | PRN
  Administered 2023-07-07: 2 mL

## 2023-07-07 MED ORDER — SODIUM CHLORIDE 0.9 % WEIGHT BASED INFUSION: 1.0000 mL/kg/h | INTRAVENOUS | Status: DC

## 2023-07-07 MED ORDER — VERAPAMIL HCL 2.5 MG/ML IV SOLN
INTRAVENOUS | Status: AC
  Filled 2023-07-07: qty 2

## 2023-07-07 MED ORDER — SODIUM CHLORIDE 0.9 % IV SOLN: 250.0000 mL | INTRAVENOUS | Status: DC | PRN

## 2023-07-07 MED ORDER — HEPARIN (PORCINE) IN NACL 1000-0.9 UT/500ML-% IV SOLN
INTRAVENOUS | Status: DC | PRN
  Administered 2023-07-07 (×2): 500 mL

## 2023-07-07 MED ORDER — MIDAZOLAM HCL 2 MG/2ML IJ SOLN
INTRAMUSCULAR | Status: DC | PRN
  Administered 2023-07-07: 1 mg via INTRAVENOUS

## 2023-07-07 MED ORDER — HYDRALAZINE HCL 20 MG/ML IJ SOLN: 10.0000 mg | INTRAMUSCULAR | Status: DC | PRN

## 2023-07-07 MED ORDER — MIDAZOLAM HCL 2 MG/2ML IJ SOLN
INTRAMUSCULAR | Status: AC
  Filled 2023-07-07: qty 2

## 2023-07-07 MED ORDER — HEPARIN SODIUM (PORCINE) 1000 UNIT/ML IJ SOLN
INTRAMUSCULAR | Status: AC
  Filled 2023-07-07: qty 10

## 2023-07-07 MED ORDER — SODIUM CHLORIDE 0.9% FLUSH: 3.0000 mL | Freq: Two times a day (BID) | INTRAVENOUS | Status: DC

## 2023-07-07 MED ORDER — ASPIRIN 81 MG PO CHEW: 81.0000 mg | CHEWABLE_TABLET | ORAL | Status: DC

## 2023-07-07 MED ORDER — IOHEXOL 350 MG/ML SOLN
INTRAVENOUS | Status: DC | PRN
  Administered 2023-07-07: 55 mL

## 2023-07-07 MED ORDER — LIDOCAINE HCL (PF) 1 % IJ SOLN
INTRAMUSCULAR | Status: AC
  Filled 2023-07-07: qty 30

## 2023-07-07 MED ORDER — HEPARIN SODIUM (PORCINE) 1000 UNIT/ML IJ SOLN
INTRAMUSCULAR | Status: DC | PRN
  Administered 2023-07-07: 3000 [IU] via INTRAVENOUS

## 2023-07-07 MED ORDER — SODIUM CHLORIDE 0.9% FLUSH: 3.0000 mL | INTRAVENOUS | Status: DC | PRN

## 2023-07-07 MED ORDER — ACETAMINOPHEN 325 MG PO TABS: 650.0000 mg | ORAL_TABLET | ORAL | Status: DC | PRN

## 2023-07-07 MED ORDER — FENTANYL CITRATE (PF) 100 MCG/2ML IJ SOLN
INTRAMUSCULAR | Status: AC
  Filled 2023-07-07: qty 2

## 2023-07-07 MED ORDER — SODIUM CHLORIDE 0.9 % WEIGHT BASED INFUSION
3.0000 mL/kg/h | INTRAVENOUS | Status: AC
  Administered 2023-07-07: 3 mL/kg/h via INTRAVENOUS

## 2023-07-07 MED ORDER — ONDANSETRON HCL 4 MG/2ML IJ SOLN: 4.0000 mg | Freq: Four times a day (QID) | INTRAMUSCULAR | Status: DC | PRN

## 2023-07-07 SURGICAL SUPPLY — 11 items
CATH 5FR JL3.5 JR4 ANG PIG MP (CATHETERS) IMPLANT
DEVICE RAD COMP TR BAND LRG (VASCULAR PRODUCTS) IMPLANT
GLIDESHEATH SLEND SS 6F .021 (SHEATH) IMPLANT
GUIDEWIRE INQWIRE 1.5J.035X260 (WIRE) IMPLANT
INQWIRE 1.5J .035X260CM (WIRE) ×1
PACK CARDIAC CATHETERIZATION (CUSTOM PROCEDURE TRAY) ×1 IMPLANT
PROTECTION STATION PRESSURIZED (MISCELLANEOUS) ×1
SET ATX-X65L (MISCELLANEOUS) IMPLANT
SHEATH PROBE COVER 6X72 (BAG) IMPLANT
STATION PROTECTION PRESSURIZED (MISCELLANEOUS) IMPLANT
WIRE HI TORQ VERSACORE-J 145CM (WIRE) IMPLANT

## 2023-07-07 NOTE — Telephone Encounter (Signed)
Called daughter to f/u.  Reports after testing today was told by Dr. Swaziland that pt may need Bypass.  Would like to know what the plan is for her mother.  Advised will send message to MD to f/u.

## 2023-07-07 NOTE — Telephone Encounter (Signed)
Daughter calling in regarding what was found today doing patient surgery. She has come concerns and want the dr to review the information. Please advise

## 2023-07-07 NOTE — Progress Notes (Signed)
TR BAND REMOVAL  LOCATION:    right radial  DEFLATED PER PROTOCOL:    Yes.    TIME BAND OFF / DRESSING APPLIED:    1225 gauze dressing applied    SITE UPON ARRIVAL:    Level 0  SITE AFTER BAND REMOVAL:    Level 0  CIRCULATION SENSATION AND MOVEMENT:    Within Normal Limits   Yes.    COMMENTS:   no issues noted

## 2023-07-07 NOTE — Discharge Instructions (Signed)

## 2023-07-07 NOTE — Interval H&P Note (Signed)
History and Physical Interval Note:  07/07/2023 9:35 AM  Donna Guerrero  has presented today for surgery, with the diagnosis of abnormal cardiac ct.  The various methods of treatment have been discussed with the patient and family. After consideration of risks, benefits and other options for treatment, the patient has consented to  Procedure(s): LEFT HEART CATH AND CORONARY ANGIOGRAPHY (N/A) as a surgical intervention.  The patient's history has been reviewed, patient examined, no change in status, stable for surgery.  I have reviewed the patient's chart and labs.  Questions were answered to the patient's satisfaction.    Cath Lab Visit (complete for each Cath Lab visit)  Clinical Evaluation Leading to the Procedure:   ACS: No.  Non-ACS:    Anginal Classification: CCS II  Anti-ischemic medical therapy: Maximal Therapy (2 or more classes of medications)  Non-Invasive Test Results: High-risk stress test findings: cardiac mortality >3%/year  Prior CABG: No previous CABG       [facility] 07/07/2023 9:35 AM

## 2023-07-08 ENCOUNTER — Encounter (HOSPITAL_COMMUNITY): Payer: Self-pay | Admitting: Cardiology

## 2023-07-12 ENCOUNTER — Emergency Department (HOSPITAL_COMMUNITY): Payer: Medicare Other

## 2023-07-12 ENCOUNTER — Encounter (HOSPITAL_COMMUNITY): Payer: Self-pay

## 2023-07-12 ENCOUNTER — Emergency Department (HOSPITAL_COMMUNITY)
Admission: EM | Admit: 2023-07-12 | Discharge: 2023-07-13 | Disposition: A | Discharge status: Home / Self Care | Payer: Medicare Other | Attending: Emergency Medicine | Admitting: Emergency Medicine

## 2023-07-12 DIAGNOSIS — E1165 Type 2 diabetes mellitus with hyperglycemia: Secondary | ICD-10-CM | POA: Diagnosis not present

## 2023-07-12 DIAGNOSIS — R519 Headache, unspecified: Secondary | ICD-10-CM | POA: Insufficient documentation

## 2023-07-12 DIAGNOSIS — R0789 Other chest pain: Secondary | ICD-10-CM | POA: Insufficient documentation

## 2023-07-12 DIAGNOSIS — M533 Sacrococcygeal disorders, not elsewhere classified: Secondary | ICD-10-CM | POA: Diagnosis not present

## 2023-07-12 DIAGNOSIS — E1122 Type 2 diabetes mellitus with diabetic chronic kidney disease: Secondary | ICD-10-CM | POA: Diagnosis not present

## 2023-07-12 DIAGNOSIS — E119 Type 2 diabetes mellitus without complications: Secondary | ICD-10-CM | POA: Diagnosis not present

## 2023-07-12 DIAGNOSIS — E039 Hypothyroidism, unspecified: Secondary | ICD-10-CM | POA: Insufficient documentation

## 2023-07-12 DIAGNOSIS — E782 Mixed hyperlipidemia: Secondary | ICD-10-CM | POA: Diagnosis not present

## 2023-07-12 DIAGNOSIS — I2510 Atherosclerotic heart disease of native coronary artery without angina pectoris: Secondary | ICD-10-CM | POA: Insufficient documentation

## 2023-07-12 DIAGNOSIS — M47812 Spondylosis without myelopathy or radiculopathy, cervical region: Secondary | ICD-10-CM | POA: Diagnosis not present

## 2023-07-12 DIAGNOSIS — M51369 Other intervertebral disc degeneration, lumbar region without mention of lumbar back pain or lower extremity pain: Secondary | ICD-10-CM | POA: Diagnosis not present

## 2023-07-12 DIAGNOSIS — I10 Essential (primary) hypertension: Secondary | ICD-10-CM | POA: Insufficient documentation

## 2023-07-12 DIAGNOSIS — S0990XA Unspecified injury of head, initial encounter: Secondary | ICD-10-CM | POA: Diagnosis not present

## 2023-07-12 DIAGNOSIS — T07XXXA Unspecified multiple injuries, initial encounter: Secondary | ICD-10-CM | POA: Diagnosis not present

## 2023-07-12 DIAGNOSIS — W19XXXA Unspecified fall, initial encounter: Secondary | ICD-10-CM

## 2023-07-12 DIAGNOSIS — R079 Chest pain, unspecified: Secondary | ICD-10-CM | POA: Diagnosis not present

## 2023-07-12 DIAGNOSIS — S199XXA Unspecified injury of neck, initial encounter: Secondary | ICD-10-CM | POA: Diagnosis not present

## 2023-07-12 DIAGNOSIS — M5030 Other cervical disc degeneration, unspecified cervical region: Secondary | ICD-10-CM | POA: Diagnosis not present

## 2023-07-12 DIAGNOSIS — Z743 Need for continuous supervision: Secondary | ICD-10-CM | POA: Diagnosis not present

## 2023-07-12 DIAGNOSIS — I700 Atherosclerosis of aorta: Secondary | ICD-10-CM | POA: Diagnosis not present

## 2023-07-12 LAB — CBC
HCT: 41.9 % (ref 36.0–46.0)
Hemoglobin: 13.6 g/dL (ref 12.0–15.0)
MCH: 30.4 pg (ref 26.0–34.0)
MCHC: 32.5 g/dL (ref 30.0–36.0)
MCV: 93.5 fL (ref 80.0–100.0)
Platelets: 215 10*3/uL (ref 150–400)
RBC: 4.48 MIL/uL (ref 3.87–5.11)
RDW: 13.3 % (ref 11.5–15.5)
WBC: 8.4 10*3/uL (ref 4.0–10.5)
nRBC: 0 % (ref 0.0–0.2)

## 2023-07-12 LAB — BASIC METABOLIC PANEL
Anion gap: 10 (ref 5–15)
BUN: 21 mg/dL (ref 8–23)
CO2: 27 mmol/L (ref 22–32)
Calcium: 9.5 mg/dL (ref 8.9–10.3)
Chloride: 97 mmol/L — ABNORMAL LOW (ref 98–111)
Creatinine, Ser: 0.82 mg/dL (ref 0.44–1.00)
GFR, Estimated: >60 mL/min (ref >60)
Glucose, Bld: 219 mg/dL — ABNORMAL HIGH (ref 70–99)
Potassium: 3.3 mmol/L — ABNORMAL LOW (ref 3.5–5.1)
Sodium: 134 mmol/L — ABNORMAL LOW (ref 135–145)

## 2023-07-12 LAB — TROPONIN I (HIGH SENSITIVITY): Troponin I (High Sensitivity): 7 ng/L (ref <18)

## 2023-07-12 NOTE — ED Provider Triage Note (Signed)
Emergency Medicine Provider Triage Evaluation Note  Donna Guerrero , a 82 y.o. female  was evaluated in triage.  Pt complains of fall/chest pain. Patient fell earlier today and landed on her tailbone. She hit the back of her head and felt "drunk" after. In the waiting room, she started to feel her heart fluttering and pressure. She was suppose to have coronary artery stents placed last week but did not due to the extent of her stenosis.  Review of Systems  Positive: Chest pain Negative: SOB  Physical Exam  BP (!) 160/68   Pulse 78   Temp 98.7 F (37.1 C)   Resp 14   Ht 5' (1.524 m)   Wt 60.3 kg   SpO2 100%   BMI 25.97 kg/m  Gen:   Awake, no distress   Resp:  Normal effort  MSK:   Moves extremities without difficulty  Other:  Abrasion to the back of the head  Medical Decision Making  Medically screening exam initiated at 7:28 PM.  Appropriate orders placed.  Jaci Carrel Court was informed that the remainder of the evaluation will be completed by another provider, this initial triage assessment does not replace that evaluation, and the importance of remaining in the ED until their evaluation is complete.   Maxwell Marion, PA-C 07/12/23 1934

## 2023-07-12 NOTE — ED Triage Notes (Addendum)
Pt returned to triage for sudden onset of CP while waiting to be further evaluated for a fall. Pt became stressed about waiting to get results and began having centralized, non-radiating CP that felt like a (quiver) or indigestion.  The pt had a recent cardiac cath without stent placement.  She is being considered for Open Heart Bypass surgery.  After discussing with PA we will continue care with cardiac workup

## 2023-07-12 NOTE — ED Provider Triage Note (Signed)
Emergency Medicine Provider Triage Evaluation Note  Donna Guerrero , a 82 y.o. female  was evaluated in triage.  Pt complains of fall.  Review of Systems    Physical Exam  BP 136/64   Pulse 71   Temp 97.7 F (36.5 C)   Resp 17   Ht 5' (1.524 m)   Wt 60.3 kg   SpO2 99%   BMI 25.97 kg/m    Medical Decision Making  Medically screening exam initiated at 1:55 PM.  Appropriate orders placed.  [name] was informed that the remainder of the evaluation will be completed by another provider, this initial triage assessment does not replace that evaluation, and the importance of remaining in the ED until their evaluation is complete.  Patient presents after fall from 2 to 3 feet up.  Landed on her rear end and went back reports hitting her head.  No loss consciousness.  Complaining pain in her head and sacral area.  On exam does have some tenderness to the occipital area.  Also tenderness over the inferior sacrum or coccyx.  Pelvis stable.  No other apparent injury.  No thoracic or lumbar spine tenderness.  Will get CT imaging of head and neck and x-ray of sacrum.   Benjiman Core, MD 07/12/23 1355

## 2023-07-12 NOTE — ED Triage Notes (Signed)
Pt arrives via [facility]. PT states she accidentally fell of a platform about 2-3 feet. Pt landed on her buttocks and then hit her head. No loc and no blood thinners. Pt is AxOx4. She does have a hematoma to back of her head and endorses pain to her tailbone.

## 2023-07-13 ENCOUNTER — Other Ambulatory Visit: Payer: Self-pay

## 2023-07-13 LAB — TROPONIN I (HIGH SENSITIVITY): Troponin I (High Sensitivity): 7 ng/L (ref <18)

## 2023-07-13 MED ORDER — PROPRANOLOL HCL 10 MG PO TABS
40.0000 mg | ORAL_TABLET | Freq: Once | ORAL | Status: DC
  Filled 2023-07-13: qty 4

## 2023-07-13 MED ORDER — ACETAMINOPHEN 500 MG PO TABS
1000.0000 mg | ORAL_TABLET | Freq: Once | ORAL | Status: AC
  Administered 2023-07-13: 1000 mg via ORAL
  Filled 2023-07-13: qty 2

## 2023-07-13 NOTE — Discharge Instructions (Signed)
You were evaluated in the Emergency Department and after careful evaluation, we did not find any emergent condition requiring admission or further testing in the hospital.  Your exam/testing today is overall reassuring.  No significant injuries, no signs of heart strain or heart damage.  Please return to the Emergency Department if you experience any worsening of your condition.   Thank you for allowing [place] to be a part of your care.

## 2023-07-13 NOTE — ED Provider Notes (Signed)
[facility] Emergency Department Provider Note MRN:  [id number]  Arrival date & time: 07/13/23     Chief Complaint   Fall   History of Present Illness   Donna Guerrero is a 82 y.o. year-old female with a history of diabetes, CAD presenting to the ED with chief complaint of fall.  Tripped and fell off a stoop.  Hit the back of her head.  Endorsing some headache, pain to the tailbone.  While in the waiting room for a few hours had a quivering sensation to her chest, was feeling anxious at the time.  No chest pain currently.  Review of Systems  A thorough review of systems was obtained and all systems are negative except as noted in the HPI and PMH.   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   Hyperlipidemia    INTOLERANT OF STATINS   Hypertension    Hypothyroidism    Rosacea    Whooping cough     Past Surgical History:  Procedure Laterality Date   CATARACT EXTRACTION     LEFT HEART CATH AND CORONARY ANGIOGRAPHY N/A 07/07/2023   Procedure: LEFT HEART CATH AND CORONARY ANGIOGRAPHY;  Surgeon: [place], Peter M, MD;  Location: [facility] INVASIVE CV LAB;  Service: Cardiovascular;  Laterality: N/A;   RETINAL DETACHMENT SURGERY      Family History  Problem Relation Age of Onset   Heart disease Mother        heart valve d/o   CAD Father    Heart attack Father        X2   COPD Brother    Hypertension Neg Hx    Stroke Neg Hx     Social History   Socioeconomic History   Marital status: Married    Spouse name: Not on file   Number of children: Not on file   Years of education: Not on file   Highest education level: Not on file  Occupational History   Not on file  Tobacco Use   Smoking status: Never   Smokeless tobacco: Never  Vaping Use   Vaping status: Never Used  Substance and Sexual Activity   Alcohol use:  Yes    Alcohol/week: 2.0 standard drinks of alcohol    Types: 2 Glasses of wine per week   Drug use: No   Sexual activity: Not on file  Other Topics Concern   Not on file  Social History Narrative   Not on file   Social Determinants of Health   Financial Resource Strain: Not on file  Food Insecurity: Not on file  Transportation Needs: Not on file  Physical Activity: Not on file  Stress: Not on file  Social Connections: Not on file  Intimate Partner Violence: Not on file     Physical Exam   Vitals:   07/12/23 1842 07/12/23 2251  BP: (!) 160/68 (!) 170/71  Pulse: 78 77  Resp: 14 18  Temp: 98.7 F (37.1 C) 98.3 F (36.8 C)  SpO2: 100% 99%    CONSTITUTIONAL: Well-appearing, NAD NEURO/PSYCH:  Alert and oriented x 3, no focal deficits EYES:  eyes equal and reactive ENT/NECK:  no LAD, no JVD CARDIO: Regular rate, well-perfused, normal S1 and S2 PULM:  CTAB no wheezing or rhonchi GI/GU:  non-distended, non-tender MSK/SPINE:  No gross  deformities, no edema SKIN:  no rash, atraumatic   *Additional and/or pertinent findings included in MDM below  Diagnostic and Interventional Summary    EKG Interpretation Date/Time:  Monday July 12 2023 19:14:04 EDT Ventricular Rate:  82 PR Interval:  186 QRS Duration:  94 QT Interval:  384 QTC Calculation: 448 R Axis:   -43  Text Interpretation: Normal sinus rhythm with sinus arrhythmia Left axis deviation Pulmonary disease pattern Left ventricular hypertrophy with repolarization abnormality ( R in aVL , Cornell product ) Abnormal ECG When compared with ECG of 21-Jun-2023 13:53, PREVIOUS ECG IS PRESENT Confirmed by Kennis Carina [phone]) on 07/13/2023 12:09:29 AM       Labs Reviewed  BASIC METABOLIC PANEL - Abnormal; Notable for the following components:      Result Value   Sodium 134 (*)    Potassium 3.3 (*)    Chloride 97 (*)    Glucose, Bld 219 (*)    All other components within normal limits  CBC  TROPONIN I (HIGH  SENSITIVITY)  TROPONIN I (HIGH SENSITIVITY)    DG Chest 2 View  Final Result    CT HEAD WO CONTRAST ( )  Final Result    CT Cervical Spine Wo Contrast  Final Result    DG Sacrum/Coccyx  Final Result      Medications  propranolol (INDERAL) tablet 40 mg (has no administration in time range)  acetaminophen (TYLENOL) tablet 1,000 mg (1,000 mg Oral Given 07/13/23 0129)     Procedures  /  Critical Care Procedures  ED Course and Medical Decision Making  Initial Impression and Ddx Mechanical fall, differential diagnosis includes intracranial bleeding, cervical spinal fracture, pneumothorax, sacral/coccyx fracture.  Neurologically no concerns.  Had an episode of abnormal chest sensation in the waiting room, favoring more anxiety related though patient does have a history of CAD and is being evaluated for possible CABG.  Past medical/surgical history that increases complexity of ED encounter: CAD  Interpretation of Diagnostics I personally reviewed the EKG and my interpretation is as follows: Sinus rhythm without concerning ischemic changes  Labs reassuring with no significant blood count or electrolyte disturbance.  Troponin negative x 2   Patient Reassessment and Ultimate Disposition/Management     Imaging reassuring, no traumatic injuries, overall very low concern for cardiac cause of chest sensation.  Patient appropriate for discharge.  Patient management required discussion with the following services or consulting groups:  None  Complexity of Problems Addressed Acute illness or injury that poses threat of life of bodily function  Additional Data Reviewed and Analyzed Further history obtained from: Further history from spouse/family member  Additional Factors Impacting ED Encounter Risk None  Elmer Sow. Pilar Plate, MD [facility] Emergency Medicine [facility] [email or web address]  Final Clinical Impressions(s) / ED Diagnoses     ICD-10-CM   1. Fall,  initial encounter  W19.XXXA     2. Chest discomfort  R07.89       ED Discharge Orders     None        Discharge Instructions Discussed with and Provided to Patient:    Discharge Instructions      You were evaluated in the Emergency Department and after careful evaluation, we did not find any emergent condition requiring admission or further testing in the hospital.  Your exam/testing today is overall reassuring.  No significant injuries, no signs of heart strain or heart damage.  Please return to the Emergency Department if you experience any worsening of your condition.  Thank you for allowing [place] to be a part of your care.      Sabas Sous, MD 07/13/23 [phone]

## 2023-07-14 ENCOUNTER — Telehealth: Payer: Self-pay | Admitting: Internal Medicine

## 2023-07-14 MED ORDER — PROPRANOLOL HCL 40 MG PO TABS: 40.0000 mg | ORAL_TABLET | Freq: Every evening | ORAL | 3 refills | Status: DC

## 2023-07-14 NOTE — Telephone Encounter (Signed)
Called pt reports had a fall went to ED on paperwork it says you may be taking this medication incorrectly.  Pt reports takes Propanolol 40 mg PO Q evening as instructed by Dr. Eldridge Dace.   Pt would also like a refill of Propranolol.   Advised pt med is ordered as 40 mg PO every day.  No changes were made by ED.  Refilled medication to pharmacy of pt choice.  No further concerns at this time.

## 2023-07-14 NOTE — Telephone Encounter (Signed)
Pt c/o medication issue:  1. Name of Medication:  propranolol (INDERAL) 40 MG tablet   2. How are you currently taking this medication (dosage and times per day)?   3. Are you having a reaction (difficulty breathing--STAT)?   4. What is your medication issue?    Patient would like to clarify medication dose + instructions.

## 2023-07-22 ENCOUNTER — Ambulatory Visit (HOSPITAL_COMMUNITY): Payer: Medicare Other | Attending: Cardiology

## 2023-07-22 DIAGNOSIS — I5030 Unspecified diastolic (congestive) heart failure: Secondary | ICD-10-CM | POA: Diagnosis not present

## 2023-07-22 DIAGNOSIS — I10 Essential (primary) hypertension: Secondary | ICD-10-CM | POA: Diagnosis not present

## 2023-07-22 DIAGNOSIS — Z01818 Encounter for other preprocedural examination: Secondary | ICD-10-CM | POA: Diagnosis not present

## 2023-07-22 DIAGNOSIS — E785 Hyperlipidemia, unspecified: Secondary | ICD-10-CM | POA: Diagnosis not present

## 2023-07-22 DIAGNOSIS — I080 Rheumatic disorders of both mitral and aortic valves: Secondary | ICD-10-CM | POA: Diagnosis not present

## 2023-07-22 DIAGNOSIS — Z01810 Encounter for preprocedural cardiovascular examination: Secondary | ICD-10-CM | POA: Insufficient documentation

## 2023-07-22 DIAGNOSIS — E119 Type 2 diabetes mellitus without complications: Secondary | ICD-10-CM | POA: Insufficient documentation

## 2023-07-22 DIAGNOSIS — I77810 Thoracic aortic ectasia: Secondary | ICD-10-CM | POA: Diagnosis not present

## 2023-07-22 LAB — ECHOCARDIOGRAM COMPLETE
AR max vel: 1.28 cm2
AV Area VTI: 1.22 cm2
AV Area mean vel: 1.19 cm2
AV Mean grad: 9 mm[Hg]
AV Peak grad: 12.7 mm[Hg]
Ao pk vel: 1.78 m/s
Area-P 1/2: 2.89 cm2
S' Lateral: 2.5 cm

## 2023-07-22 NOTE — Progress Notes (Signed)
[address]       [place] [address]             [phone]        Donna Guerrero [place] Medical Record #[id number] Date of Birth: Dec 16, 1940  Referring: [place], Peter M, MD Primary Care: Merlene Laughter, MD (Inactive) Primary Cardiologist:Mahesh Richardean Chimera, MD  Chief Complaint:    Chief Complaint  Patient presents with   Coronary Artery Disease    History of Present Illness:     Donna Guerrero 82 y.o. 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  She has been symptomatic with exertion of the last 50yrs.  She currently lives with her daughter and remains active.  She has had thyroid radiation in the past   Past Medical and Surgical History: Previous Chest Surgery: no Previous Chest Radiation: no Diabetes Mellitus: no.  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   Hyperlipidemia    INTOLERANT OF STATINS   Hypertension    Hypothyroidism    Rosacea    Whooping cough     Past Surgical History:  Procedure Laterality Date   CATARACT EXTRACTION     LEFT HEART CATH AND CORONARY ANGIOGRAPHY N/A 07/07/2023   Procedure: LEFT HEART CATH AND CORONARY ANGIOGRAPHY;  Surgeon: [place], Peter M, MD;  Location: [facility] INVASIVE CV LAB;  Service: Cardiovascular;  Laterality: N/A;   RETINAL DETACHMENT SURGERY      Social History:  Social History   Tobacco Use  Smoking Status Never  Smokeless Tobacco Never    Social History   Substance and Sexual Activity  Alcohol Use Yes   Alcohol/week: 2.0 standard drinks of alcohol   Types: 2 Glasses of wine per week     Allergies  Allergen Reactions   Welchol [Colesevelam Hcl] Other (See Comments)    Tablets were too big, CAUSED  FLU-LIKE SYMPTOMS   Amaryl [Glimepiride] Other (See Comments)    headache   Atorvastatin     MYALGIAS   Codeine Nausea Only   Crestor [Rosuvastatin]     Myalgias   Metformin Hcl Other (See Comments)    Unknown reaction   Simvastatin Nausea Only      Current Outpatient Medications  Medication Sig Dispense Refill   acetaminophen (TYLENOL) 500 MG tablet Take 500 mg by mouth every 6 (six) hours as needed for moderate pain.     amLODipine (NORVASC) 2.5 MG tablet Take 1.25 mg by mouth at bedtime.     chlorthalidone (HYGROTON) 25 MG tablet Take 1 tablet (25 mg total) by mouth daily. Please make overdue appt with Dr. Eldridge Dace before anymore refills. 1st attempt 30 tablet 0   dorzolamide-timolol (COSOPT) 2-0.5 % ophthalmic solution Place 1 drop into both eyes 2 (two) times daily.     glucose blood test strip 1 each by Other route as needed for other. Use as instructed     lisinopril (PRINIVIL,ZESTRIL) 40 MG tablet Take 1 tablet (40 mg total) by mouth daily. 90 tablet 1   LIVALO 1 MG TABS Take 1 mg by mouth every Monday, Wednesday, and Friday.     potassium chloride SA (K-DUR,KLOR-CON) 20 MEQ  tablet Take 1 tablet (20 mEq total) by mouth daily. 90 tablet 3   propranolol (INDERAL) 40 MG tablet Take 1 tablet (40 mg total) by mouth every evening. 90 tablet 3   SYNTHROID 88 MCG tablet Take 75 mcg by mouth daily before breakfast.     VYZULTA 0.024 % SOLN Place 1 drop into both eyes at bedtime.     No current facility-administered medications for this visit.    (Not in a hospital admission)   Family History  Problem Relation Age of Onset   Heart disease Mother        heart valve d/o   CAD Father    Heart attack Father        X2   COPD Brother    Hypertension Neg Hx    Stroke Neg Hx      Review of Systems:   Review of Systems  Constitutional:  Negative for malaise/fatigue.  Respiratory:  Positive for shortness of breath.   Cardiovascular:  Positive for chest pain.  Neurological:  Negative.       Physical Exam: BP 129/79 (BP Location: Left Arm, Patient Position: Sitting)   Pulse 65   Resp 18   Ht 5' (1.524 m)   Wt 136 lb (61.7 kg)   SpO2 97% Comment: RA  BMI 26.56 kg/m  Physical Exam Constitutional:      General: She is not in acute distress.    Appearance: She is not ill-appearing.  HENT:     Head: Normocephalic and atraumatic.  Eyes:     Extraocular Movements: Extraocular movements intact.  Cardiovascular:     Rate and Rhythm: Normal rate.  Pulmonary:     Effort: Pulmonary effort is normal.  Abdominal:     General: Abdomen is flat. There is no distension.  Musculoskeletal:        General: Normal range of motion.     Cervical back: Normal range of motion.  Skin:    General: Skin is warm and dry.  Neurological:     General: No focal deficit present.     Mental Status: She is alert and oriented to person, place, and time.       Diagnostic Studies & Laboratory data:    Left Heart Catherization:  Intervention  Echo: IMPRESSIONS     1. Left ventricular ejection fraction, by estimation, is 55 to 60%. The  left ventricle has normal function. The left ventricle has no regional  wall motion abnormalities. Left ventricular diastolic parameters are  consistent with Grade I diastolic  dysfunction (impaired relaxation).   2. Right ventricular systolic function is normal. The right ventricular  size is normal. Tricuspid regurgitation signal is inadequate for assessing  PA pressure.   3. The mitral valve is normal in structure. Trivial mitral valve  regurgitation. No evidence of mitral stenosis.   4. The aortic valve is tricuspid. There is moderate calcification of the  aortic valve. Aortic valve regurgitation is trivial. Aortic valve  sclerosis/calcification is present, without any evidence of aortic  stenosis. Aortic valve mean gradient measures  9.0 mmHg.   5. Aortic dilatation noted. There is mild dilatation of the ascending  aorta,  measuring 39 mm.   6. The inferior vena cava is normal in size with greater than 50%  respiratory variability, suggesting right atrial pressure of 3 mmHg.    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  Echocardiogram shows preserved biventricular function and no significant valvular disease.  The risks and benefits of CABG were discussed in detail.  The patient is agreeable to proceed.    CABG 3.  LAD, large OM, PDA     I  spent 40 minutes counseling the patient face to face.   Corliss Skains 07/23/2023 1:37 PM

## 2023-07-22 NOTE — H&P (View-Only) (Signed)
[address]       [place] [address]             [phone]        Donna Guerrero [place] Medical Record #[id number] Date of Birth: Dec 16, 1940  Referring: [place], Peter M, MD Primary Care: Merlene Laughter, MD (Inactive) Primary Cardiologist:Mahesh Richardean Chimera, MD  Chief Complaint:    Chief Complaint  Patient presents with   Coronary Artery Disease    History of Present Illness:     Donna Guerrero 82 y.o. 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  She has been symptomatic with exertion of the last 50yrs.  She currently lives with her daughter and remains active.  She has had thyroid radiation in the past   Past Medical and Surgical History: Previous Chest Surgery: no Previous Chest Radiation: no Diabetes Mellitus: no.  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   Hyperlipidemia    INTOLERANT OF STATINS   Hypertension    Hypothyroidism    Rosacea    Whooping cough     Past Surgical History:  Procedure Laterality Date   CATARACT EXTRACTION     LEFT HEART CATH AND CORONARY ANGIOGRAPHY N/A 07/07/2023   Procedure: LEFT HEART CATH AND CORONARY ANGIOGRAPHY;  Surgeon: [place], Peter M, MD;  Location: [facility] [place] INVASIVE CV LAB;  Service: Cardiovascular;  Laterality: N/A;   RETINAL DETACHMENT SURGERY      Social History:  Social History   Tobacco Use  Smoking Status Never  Smokeless Tobacco Never    Social History   Substance and Sexual Activity  Alcohol Use Yes   Alcohol/week: 2.0 standard drinks of alcohol   Types: 2 Glasses of wine per week     Allergies  Allergen Reactions   Welchol [Colesevelam Hcl] Other (See Comments)    Tablets were too big, CAUSED  FLU-LIKE SYMPTOMS   Amaryl [Glimepiride] Other (See Comments)    headache   Atorvastatin     MYALGIAS   Codeine Nausea Only   Crestor [Rosuvastatin]     Myalgias   Metformin Hcl Other (See Comments)    Unknown reaction   Simvastatin Nausea Only      Current Outpatient Medications  Medication Sig Dispense Refill   acetaminophen (TYLENOL) 500 MG tablet Take 500 mg by mouth every 6 (six) hours as needed for moderate pain.     amLODipine (NORVASC) 2.5 MG tablet Take 1.25 mg by mouth at bedtime.     chlorthalidone (HYGROTON) 25 MG tablet Take 1 tablet (25 mg total) by mouth daily. Please make overdue appt with Dr. Eldridge Dace before anymore refills. 1st attempt 30 tablet 0   dorzolamide-timolol (COSOPT) 2-0.5 % ophthalmic solution Place 1 drop into both eyes 2 (two) times daily.     glucose blood test strip 1 each by Other route as needed for other. Use as instructed     lisinopril (PRINIVIL,ZESTRIL) 40 MG tablet Take 1 tablet (40 mg total) by mouth daily. 90 tablet 1   LIVALO 1 MG TABS Take 1 mg by mouth every Monday, Wednesday, and Friday.     potassium chloride SA (K-DUR,KLOR-CON) 20 MEQ  tablet Take 1 tablet (20 mEq total) by mouth daily. 90 tablet 3   propranolol (INDERAL) 40 MG tablet Take 1 tablet (40 mg total) by mouth every evening. 90 tablet 3   SYNTHROID 88 MCG tablet Take 75 mcg by mouth daily before breakfast.     VYZULTA 0.024 % SOLN Place 1 drop into both eyes at bedtime.     No current facility-administered medications for this visit.    (Not in a hospital admission)   Family History  Problem Relation Age of Onset   Heart disease Mother        heart valve d/o   CAD Father    Heart attack Father        X2   COPD Brother    Hypertension Neg Hx    Stroke Neg Hx      Review of Systems:   Review of Systems  Constitutional:  Negative for malaise/fatigue.  Respiratory:  Positive for shortness of breath.   Cardiovascular:  Positive for chest pain.  Neurological:  Negative.       Physical Exam: BP 129/79 (BP Location: Left Arm, Patient Position: Sitting)   Pulse 65   Resp 18   Ht 5' (1.524 m)   Wt 136 lb (61.7 kg)   SpO2 97% Comment: RA  BMI 26.56 kg/m  Physical Exam Constitutional:      General: She is not in acute distress.    Appearance: She is not ill-appearing.  HENT:     Head: Normocephalic and atraumatic.  Eyes:     Extraocular Movements: Extraocular movements intact.  Cardiovascular:     Rate and Rhythm: Normal rate.  Pulmonary:     Effort: Pulmonary effort is normal.  Abdominal:     General: Abdomen is flat. There is no distension.  Musculoskeletal:        General: Normal range of motion.     Cervical back: Normal range of motion.  Skin:    General: Skin is warm and dry.  Neurological:     General: No focal deficit present.     Mental Status: She is alert and oriented to person, place, and time.       Diagnostic Studies & Laboratory data:    Left Heart Catherization:  Intervention  Echo: IMPRESSIONS     1. Left ventricular ejection fraction, by estimation, is 55 to 60%. The  left ventricle has normal function. The left ventricle has no regional  wall motion abnormalities. Left ventricular diastolic parameters are  consistent with Grade I diastolic  dysfunction (impaired relaxation).   2. Right ventricular systolic function is normal. The right ventricular  size is normal. Tricuspid regurgitation signal is inadequate for assessing  PA pressure.   3. The mitral valve is normal in structure. Trivial mitral valve  regurgitation. No evidence of mitral stenosis.   4. The aortic valve is tricuspid. There is moderate calcification of the  aortic valve. Aortic valve regurgitation is trivial. Aortic valve  sclerosis/calcification is present, without any evidence of aortic  stenosis. Aortic valve mean gradient measures  9.0 mmHg.   5. Aortic dilatation noted. There is mild dilatation of the ascending  aorta,  measuring 39 mm.   6. The inferior vena cava is normal in size with greater than 50%  respiratory variability, suggesting right atrial pressure of 3 mmHg.    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  Echocardiogram shows preserved biventricular function and no significant valvular disease.  The risks and benefits of CABG were discussed in detail.  The patient is agreeable to proceed.    CABG 3.  LAD, large OM, PDA     I  spent 40 minutes counseling the patient face to face.   Corliss Skains 07/23/2023 1:37 PM

## 2023-07-23 ENCOUNTER — Other Ambulatory Visit: Payer: Self-pay | Admitting: *Deleted

## 2023-07-23 ENCOUNTER — Institutional Professional Consult (permissible substitution): Payer: Medicare Other | Admitting: Thoracic Surgery (Cardiothoracic Vascular Surgery)

## 2023-07-23 VITALS — BP 129/79 | HR 65 | Resp 18 | Ht 60.0 in | Wt 136.0 lb

## 2023-07-23 DIAGNOSIS — I25118 Atherosclerotic heart disease of native coronary artery with other forms of angina pectoris: Secondary | ICD-10-CM | POA: Diagnosis not present

## 2023-07-27 NOTE — Pre-Procedure Instructions (Signed)
Surgical Instructions   Your procedure is scheduled on July 29, 2023. Report to [facility] Main Entrance "A" at 6:30 A.M., then check in with the Admitting office. Any questions or running late day of surgery: call [phone]  Questions prior to your surgery date: call [phone], Monday-Friday, 8am-4pm. If you experience any cold or flu symptoms such as cough, fever, chills, shortness of breath, etc. between now and your scheduled surgery, please notify us at the above number.     Remember:  Do not eat or drink after midnight the night before your surgery    Take these medicines the morning of surgery with A SIP OF WATER: dorzolamide-timolol (COSOPT) ophthalmic solution  levothyroxine (SYNTHROID)  LIVALO    May take these medicines IF NEEDED: acetaminophen (TYLENOL)    One week prior to surgery, STOP taking any Aspirin (unless otherwise instructed by your surgeon) Aleve, Naproxen, Ibuprofen, Motrin, Advil, Goody's, BC's, all herbal medications, fish oil, and non-prescription vitamins.                     Do NOT Smoke (Tobacco/Vaping) for 24 hours prior to your procedure.  If you use a CPAP at night, you may bring your mask/headgear for your overnight stay.   You will be asked to remove any contacts, glasses, piercing's, hearing aid's, dentures/partials prior to surgery. Please bring cases for these items if needed.    Patients discharged the day of surgery will not be allowed to drive home, and someone needs to stay with them for 24 hours.  SURGICAL WAITING ROOM VISITATION Patients may have no more than 2 support people in the waiting area - these visitors may rotate.   Pre-op nurse will coordinate an appropriate time for 1 ADULT support person, who may not rotate, to accompany patient in pre-op.  Children under the age of 83 must have an adult with them who is not the patient and must remain in the main waiting area with an adult.  If the patient needs to stay at the  hospital during part of their recovery, the visitor guidelines for inpatient rooms apply.  Please refer to the [facility] website for the visitor guidelines for any additional information.   If you received a COVID test during your pre-op visit  it is requested that you wear a mask when out in public, stay away from anyone that may not be feeling well and notify your surgeon if you develop symptoms. If you have been in contact with anyone that has tested positive in the last 10 days please notify you surgeon.      Pre-operative CHG Bathing Instructions   You can play a key role in reducing the risk of infection after surgery. Your skin needs to be as free of germs as possible. You can reduce the number of germs on your skin by washing with CHG (chlorhexidine gluconate) soap before surgery. CHG is an antiseptic soap that kills germs and continues to kill germs even after washing.   DO NOT use if you have an allergy to chlorhexidine/CHG or antibacterial soaps. If your skin becomes reddened or irritated, stop using the CHG and notify one of our RNs at [phone].              TAKE A SHOWER THE NIGHT BEFORE SURGERY AND THE DAY OF SURGERY    Please keep in mind the following:  DO NOT shave, including legs and underarms, 48 hours prior to surgery.   You may  shave your face before/day of surgery.  Place clean sheets on your bed the night before surgery Use a clean washcloth (not used since being washed) for each shower. DO NOT sleep with pet's night before surgery.  CHG Shower Instructions:  Wash your face and private area with normal soap. If you choose to wash your hair, wash first with your normal shampoo.  After you use shampoo/soap, rinse your hair and body thoroughly to remove shampoo/soap residue.  Turn the water OFF and apply half the bottle of CHG soap to a CLEAN washcloth.  Apply CHG soap ONLY FROM YOUR NECK DOWN TO YOUR TOES (washing for 3-5 minutes)  DO NOT use CHG soap on face,  private areas, open wounds, or sores.  Pay special attention to the area where your surgery is being performed.  If you are having back surgery, having someone wash your back for you may be helpful. Wait 2 minutes after CHG soap is applied, then you may rinse off the CHG soap.  Pat dry with a clean towel  Put on clean pajamas    Additional instructions for the day of surgery: DO NOT APPLY any lotions, deodorants, cologne, or perfumes.   Do not wear jewelry or makeup Do not wear nail polish, gel polish, artificial nails, or any other type of covering on natural nails (fingers and toes) Do not bring valuables to the hospital. [facility] is not responsible for valuables/personal belongings. Put on clean/comfortable clothes.  Please brush your teeth.  Ask your nurse before applying any prescription medications to the skin.

## 2023-07-28 ENCOUNTER — Encounter (HOSPITAL_COMMUNITY): Payer: Self-pay

## 2023-07-28 ENCOUNTER — Ambulatory Visit (HOSPITAL_BASED_OUTPATIENT_CLINIC_OR_DEPARTMENT_OTHER)
Admission: RE | Admit: 2023-07-28 | Discharge: 2023-07-28 | Disposition: A | Discharge status: Home / Self Care | Payer: Medicare Other | Source: Ambulatory Visit | Attending: Thoracic Surgery (Cardiothoracic Vascular Surgery) | Admitting: Thoracic Surgery (Cardiothoracic Vascular Surgery)

## 2023-07-28 ENCOUNTER — Ambulatory Visit (HOSPITAL_COMMUNITY)
Admission: RE | Admit: 2023-07-28 | Discharge: 2023-07-28 | Disposition: A | Discharge status: Home / Self Care | Payer: Medicare Other | Source: Ambulatory Visit | Attending: Thoracic Surgery (Cardiothoracic Vascular Surgery)

## 2023-07-28 ENCOUNTER — Other Ambulatory Visit: Payer: Self-pay

## 2023-07-28 ENCOUNTER — Encounter (HOSPITAL_COMMUNITY)
Admission: RE | Admit: 2023-07-28 | Discharge: 2023-07-28 | Disposition: A | Discharge status: Home / Self Care | Payer: Medicare Other | Source: Ambulatory Visit | Attending: Thoracic Surgery (Cardiothoracic Vascular Surgery) | Admitting: Thoracic Surgery (Cardiothoracic Vascular Surgery)

## 2023-07-28 VITALS — BP 121/53 | HR 58 | Temp 97.9°F | Resp 17 | Ht 60.0 in | Wt 135.6 lb

## 2023-07-28 DIAGNOSIS — E1165 Type 2 diabetes mellitus with hyperglycemia: Secondary | ICD-10-CM | POA: Diagnosis not present

## 2023-07-28 DIAGNOSIS — H409 Unspecified glaucoma: Secondary | ICD-10-CM | POA: Diagnosis not present

## 2023-07-28 DIAGNOSIS — E119 Type 2 diabetes mellitus without complications: Secondary | ICD-10-CM | POA: Insufficient documentation

## 2023-07-28 DIAGNOSIS — I2510 Atherosclerotic heart disease of native coronary artery without angina pectoris: Secondary | ICD-10-CM | POA: Diagnosis not present

## 2023-07-28 DIAGNOSIS — Z1152 Encounter for screening for COVID-19: Secondary | ICD-10-CM | POA: Insufficient documentation

## 2023-07-28 DIAGNOSIS — I10 Essential (primary) hypertension: Secondary | ICD-10-CM | POA: Insufficient documentation

## 2023-07-28 DIAGNOSIS — Z79899 Other long term (current) drug therapy: Secondary | ICD-10-CM | POA: Diagnosis not present

## 2023-07-28 DIAGNOSIS — I700 Atherosclerosis of aorta: Secondary | ICD-10-CM | POA: Diagnosis not present

## 2023-07-28 DIAGNOSIS — E8770 Fluid overload, unspecified: Secondary | ICD-10-CM | POA: Diagnosis not present

## 2023-07-28 DIAGNOSIS — G250 Essential tremor: Secondary | ICD-10-CM | POA: Diagnosis not present

## 2023-07-28 DIAGNOSIS — I25118 Atherosclerotic heart disease of native coronary artery with other forms of angina pectoris: Secondary | ICD-10-CM | POA: Insufficient documentation

## 2023-07-28 DIAGNOSIS — Z885 Allergy status to narcotic agent status: Secondary | ICD-10-CM | POA: Diagnosis not present

## 2023-07-28 DIAGNOSIS — Z8249 Family history of ischemic heart disease and other diseases of the circulatory system: Secondary | ICD-10-CM | POA: Diagnosis not present

## 2023-07-28 DIAGNOSIS — Z01818 Encounter for other preprocedural examination: Secondary | ICD-10-CM | POA: Insufficient documentation

## 2023-07-28 DIAGNOSIS — E785 Hyperlipidemia, unspecified: Secondary | ICD-10-CM | POA: Insufficient documentation

## 2023-07-28 DIAGNOSIS — N179 Acute kidney failure, unspecified: Secondary | ICD-10-CM | POA: Diagnosis not present

## 2023-07-28 DIAGNOSIS — D6959 Other secondary thrombocytopenia: Secondary | ICD-10-CM | POA: Diagnosis not present

## 2023-07-28 DIAGNOSIS — I97190 Other postprocedural cardiac functional disturbances following cardiac surgery: Secondary | ICD-10-CM | POA: Diagnosis not present

## 2023-07-28 DIAGNOSIS — E039 Hypothyroidism, unspecified: Secondary | ICD-10-CM | POA: Diagnosis not present

## 2023-07-28 DIAGNOSIS — D72828 Other elevated white blood cell count: Secondary | ICD-10-CM | POA: Diagnosis not present

## 2023-07-28 DIAGNOSIS — Z888 Allergy status to other drugs, medicaments and biological substances status: Secondary | ICD-10-CM | POA: Diagnosis not present

## 2023-07-28 DIAGNOSIS — E876 Hypokalemia: Secondary | ICD-10-CM | POA: Diagnosis not present

## 2023-07-28 DIAGNOSIS — R112 Nausea with vomiting, unspecified: Secondary | ICD-10-CM | POA: Diagnosis not present

## 2023-07-28 DIAGNOSIS — T40695A Adverse effect of other narcotics, initial encounter: Secondary | ICD-10-CM | POA: Diagnosis not present

## 2023-07-28 DIAGNOSIS — Z825 Family history of asthma and other chronic lower respiratory diseases: Secondary | ICD-10-CM | POA: Diagnosis not present

## 2023-07-28 DIAGNOSIS — I4891 Unspecified atrial fibrillation: Secondary | ICD-10-CM | POA: Diagnosis not present

## 2023-07-28 HISTORY — DX: Unspecified osteoarthritis, unspecified site: M19.90

## 2023-07-28 HISTORY — DX: Atherosclerotic heart disease of native coronary artery without angina pectoris: I25.10

## 2023-07-28 LAB — COMPREHENSIVE METABOLIC PANEL
ALT: 20 U/L (ref 0–44)
AST: 21 U/L (ref 15–41)
Albumin: 3.9 g/dL (ref 3.5–5.0)
Alkaline Phosphatase: 63 U/L (ref 38–126)
Anion gap: 11 (ref 5–15)
BUN: 25 mg/dL — ABNORMAL HIGH (ref 8–23)
CO2: 21 mmol/L — ABNORMAL LOW (ref 22–32)
Calcium: 9.4 mg/dL (ref 8.9–10.3)
Chloride: 104 mmol/L (ref 98–111)
Creatinine, Ser: 0.83 mg/dL (ref 0.44–1.00)
GFR, Estimated: >60 mL/min (ref >60)
Glucose, Bld: 178 mg/dL — ABNORMAL HIGH (ref 70–99)
Potassium: 3.5 mmol/L (ref 3.5–5.1)
Sodium: 136 mmol/L (ref 135–145)
Total Bilirubin: 1 mg/dL (ref 0.3–1.2)
Total Protein: 7.4 g/dL (ref 6.5–8.1)

## 2023-07-28 LAB — BLOOD GAS, ARTERIAL
Acid-Base Excess: 2 mmol/L (ref 0.0–2.0)
Bicarbonate: 26.5 mmol/L (ref 20.0–28.0)
Drawn by: 58793
O2 Saturation: 100 %
Patient temperature: 37
pCO2 arterial: 40 mm[Hg] (ref 32–48)
pH, Arterial: 7.43 (ref 7.35–7.45)
pO2, Arterial: 101 mm[Hg] (ref 83–108)

## 2023-07-28 LAB — URINALYSIS, ROUTINE W REFLEX MICROSCOPIC
Bilirubin Urine: NEGATIVE
Glucose, UA: NEGATIVE mg/dL
Hgb urine dipstick: NEGATIVE
Ketones, ur: NEGATIVE mg/dL
Leukocytes,Ua: NEGATIVE
Nitrite: NEGATIVE
Protein, ur: NEGATIVE mg/dL
Specific Gravity, Urine: 1.012 (ref 1.005–1.030)
pH: 6 (ref 5.0–8.0)

## 2023-07-28 LAB — CBC
HCT: 38.5 % (ref 36.0–46.0)
Hemoglobin: 12.4 g/dL (ref 12.0–15.0)
MCH: 29.8 pg (ref 26.0–34.0)
MCHC: 32.2 g/dL (ref 30.0–36.0)
MCV: 92.5 fL (ref 80.0–100.0)
Platelets: 201 10*3/uL (ref 150–400)
RBC: 4.16 MIL/uL (ref 3.87–5.11)
RDW: 13.3 % (ref 11.5–15.5)
WBC: 5.9 10*3/uL (ref 4.0–10.5)
nRBC: 0 % (ref 0.0–0.2)

## 2023-07-28 LAB — APTT: aPTT: 29 s (ref 24–36)

## 2023-07-28 LAB — SURGICAL PCR SCREEN
MRSA, PCR: NEGATIVE
Staphylococcus aureus: NEGATIVE

## 2023-07-28 LAB — GLUCOSE, CAPILLARY: Glucose-Capillary: 174 mg/dL — ABNORMAL HIGH (ref 70–99)

## 2023-07-28 LAB — HEMOGLOBIN A1C
Hgb A1c MFr Bld: 6.9 % — ABNORMAL HIGH (ref 4.8–5.6)
Mean Plasma Glucose: 151.33 mg/dL

## 2023-07-28 LAB — SARS CORONAVIRUS 2 BY RT PCR: SARS Coronavirus 2 by RT PCR: NEGATIVE

## 2023-07-28 LAB — PROTIME-INR
INR: 1 (ref 0.8–1.2)
Prothrombin Time: 13.6 s (ref 11.4–15.2)

## 2023-07-28 MED ORDER — POTASSIUM CHLORIDE 2 MEQ/ML IV SOLN
80.0000 meq | INTRAVENOUS | Status: DC
  Filled 2023-07-28: qty 40

## 2023-07-28 MED ORDER — CEFAZOLIN SODIUM-DEXTROSE 2-4 GM/100ML-% IV SOLN
2.0000 g | INTRAVENOUS | Status: AC
  Administered 2023-07-29 (×2): 2 g via INTRAVENOUS
  Filled 2023-07-28: qty 100

## 2023-07-28 MED ORDER — CEFAZOLIN SODIUM-DEXTROSE 2-4 GM/100ML-% IV SOLN
2.0000 g | INTRAVENOUS | Status: DC
  Filled 2023-07-28: qty 100

## 2023-07-28 MED ORDER — TRANEXAMIC ACID (OHS) BOLUS VIA INFUSION
15.0000 mg/kg | INTRAVENOUS | Status: AC
  Administered 2023-07-29: 922.5 mg via INTRAVENOUS
  Filled 2023-07-28: qty 923

## 2023-07-28 MED ORDER — TRANEXAMIC ACID 1000 MG/10ML IV SOLN
1.5000 mg/kg/h | INTRAVENOUS | Status: AC
  Administered 2023-07-29: 1.5 mg/kg/h via INTRAVENOUS
  Filled 2023-07-28: qty 25

## 2023-07-28 MED ORDER — DEXMEDETOMIDINE HCL IN NACL 400 MCG/100ML IV SOLN
0.1000 ug/kg/h | INTRAVENOUS | Status: AC
  Administered 2023-07-29: .5 ug/kg/h via INTRAVENOUS
  Filled 2023-07-28: qty 100

## 2023-07-28 MED ORDER — MILRINONE LACTATE IN DEXTROSE 20-5 MG/100ML-% IV SOLN
0.3000 ug/kg/min | INTRAVENOUS | Status: DC
  Filled 2023-07-28: qty 100

## 2023-07-28 MED ORDER — NITROGLYCERIN IN D5W 200-5 MCG/ML-% IV SOLN
2.0000 ug/min | INTRAVENOUS | Status: DC
Start: 2023-07-29 — End: 2023-07-30
  Filled 2023-07-28: qty 250

## 2023-07-28 MED ORDER — TRANEXAMIC ACID (OHS) PUMP PRIME SOLUTION
2.0000 mg/kg | INTRAVENOUS | Status: DC
  Filled 2023-07-28: qty 1.23

## 2023-07-28 MED ORDER — HEPARIN 30,000 UNITS/1000 ML (OHS) CELLSAVER SOLUTION
Status: DC
  Filled 2023-07-28: qty 1000

## 2023-07-28 MED ORDER — PHENYLEPHRINE HCL-NACL 20-0.9 MG/250ML-% IV SOLN
30.0000 ug/min | INTRAVENOUS | Status: AC
  Administered 2023-07-29: 50 ug/min via INTRAVENOUS
  Filled 2023-07-28: qty 250

## 2023-07-28 MED ORDER — INSULIN REGULAR(HUMAN) IN NACL 100-0.9 UT/100ML-% IV SOLN
INTRAVENOUS | Status: AC
  Administered 2023-07-29: 4.2 [IU]/h via INTRAVENOUS
  Filled 2023-07-28: qty 100

## 2023-07-28 MED ORDER — MANNITOL 20 % IV SOLN
INTRAVENOUS | Status: DC
  Filled 2023-07-28: qty 13

## 2023-07-28 MED ORDER — EPINEPHRINE HCL 5 MG/250ML IV SOLN IN NS
0.0000 ug/min | INTRAVENOUS | Status: DC
  Filled 2023-07-28: qty 250

## 2023-07-28 MED ORDER — VANCOMYCIN HCL 1250 MG/250ML IV SOLN
1250.0000 mg | INTRAVENOUS | Status: AC
  Administered 2023-07-29: 1250 mg via INTRAVENOUS
  Filled 2023-07-28: qty 250

## 2023-07-28 MED ORDER — NOREPINEPHRINE 4 MG/250ML-% IV SOLN
0.0000 ug/min | INTRAVENOUS | Status: DC
  Filled 2023-07-28: qty 250

## 2023-07-28 MED ORDER — PLASMA-LYTE A IV SOLN
INTRAVENOUS | Status: DC
  Filled 2023-07-28: qty 2.5

## 2023-07-28 NOTE — Progress Notes (Signed)
Additional Diabetes Information given to pt at pre-admission appointment. All questions answered   HOW TO MANAGE YOUR DIABETES BEFORE AND AFTER SURGERY  Why is it important to control my blood sugar before and after surgery? Improving blood sugar levels before and after surgery helps healing and can limit problems. A way of improving blood sugar control is eating a healthy diet by:  Eating less sugar and carbohydrates  Increasing activity/exercise  Talking with your doctor about reaching your blood sugar goals High blood sugars (greater than 180 mg/dL) can raise your risk of infections and slow your recovery, so you will need to focus on controlling your diabetes during the weeks before surgery. Make sure that the doctor who takes care of your diabetes knows about your planned surgery including the date and location.  Check your blood sugar the morning of your surgery when you wake up and every 2 hours until you get to the Short Stay unit.  If your blood sugar is less than 70 mg/dL, you will need to treat for low blood sugar: Do not take insulin. Treat a low blood sugar (less than 70 mg/dL) with  cup of clear juice (cranberry or apple), 4 glucose tablets, OR glucose gel. Recheck blood sugar in 15 minutes after treatment (to make sure it is greater than 70 mg/dL). If your blood sugar is not greater than 70 mg/dL on recheck, call [phone] for further instructions. Report your blood sugar to the short stay nurse when you get to Short Stay.  If you are admitted to the hospital after surgery: Your blood sugar will be checked by the staff and you will probably be given insulin after surgery (instead of oral diabetes medicines) to make sure you have good blood sugar levels. The goal for blood sugar control after surgery is 80-180 mg/dL.

## 2023-07-28 NOTE — Progress Notes (Signed)
PCP - Dr. Hillard Danker Cardiologist - Dr. Riley Lam - last office visit 07/05/2023. Previously seen by Dr. Eldridge Dace Endocrinologist - Dr. Talmage Coin  PPM/ICD - Denies Device Orders - n/a Rep Notified - n/a  Chest x-ray - 07/28/2023 EKG - 07/13/2023 Stress Test - 04/12/2015 ECHO - 07/22/2023 Cardiac Cath - 07/07/2023  Sleep Study - Denies CPAP - n/a  Pt is DM2. She checks her blood sugar every morning. Normal fasting range is 140s. CBG at pre-op 174. Pt is not on any DM meds per endocrinologist. A1c result pending.  Last dose of GLP1 agonist- n/a GLP1 instructions: n/a  Blood Thinner Instructions: n/a Aspirin Instructions: n/a  NPO after midnight  COVID TEST- Yes. Result pending.   Anesthesia review: Yes. HTN, CAD, DM on no meds.  Patient denies shortness of breath, fever, cough and chest pain at PAT appointment. Pt denies any respiratory illness/infection in the last two months.   All instructions explained to the patient, with a verbal understanding of the material. Patient agrees to go over the instructions while at home for a better understanding. Patient also instructed to self quarantine after being tested for COVID-19. The opportunity to ask questions was provided.

## 2023-07-29 ENCOUNTER — Other Ambulatory Visit: Payer: Self-pay

## 2023-07-29 ENCOUNTER — Inpatient Hospital Stay (HOSPITAL_COMMUNITY): Payer: Medicare Other | Admitting: Registered Nurse

## 2023-07-29 ENCOUNTER — Other Ambulatory Visit (HOSPITAL_COMMUNITY): Payer: Self-pay

## 2023-07-29 ENCOUNTER — Inpatient Hospital Stay (HOSPITAL_COMMUNITY)
Admission: RE | Disposition: A | Discharge status: Home / Self Care | Payer: Self-pay | Source: Home / Self Care | Attending: Thoracic Surgery (Cardiothoracic Vascular Surgery)

## 2023-07-29 ENCOUNTER — Inpatient Hospital Stay (HOSPITAL_COMMUNITY): Payer: Medicare Other

## 2023-07-29 ENCOUNTER — Encounter (HOSPITAL_COMMUNITY): Payer: Self-pay | Admitting: Thoracic Surgery (Cardiothoracic Vascular Surgery)

## 2023-07-29 ENCOUNTER — Inpatient Hospital Stay (HOSPITAL_COMMUNITY)
Admission: RE | Admit: 2023-07-29 | Discharge: 2023-08-03 | DRG: 236 | Disposition: A | Discharge status: Home / Self Care | Payer: Medicare Other | Attending: Thoracic Surgery (Cardiothoracic Vascular Surgery) | Admitting: Thoracic Surgery (Cardiothoracic Vascular Surgery)

## 2023-07-29 DIAGNOSIS — J90 Pleural effusion, not elsewhere classified: Secondary | ICD-10-CM | POA: Diagnosis not present

## 2023-07-29 DIAGNOSIS — J811 Chronic pulmonary edema: Secondary | ICD-10-CM | POA: Diagnosis not present

## 2023-07-29 DIAGNOSIS — E8770 Fluid overload, unspecified: Secondary | ICD-10-CM | POA: Diagnosis not present

## 2023-07-29 DIAGNOSIS — E876 Hypokalemia: Secondary | ICD-10-CM | POA: Diagnosis not present

## 2023-07-29 DIAGNOSIS — D72828 Other elevated white blood cell count: Secondary | ICD-10-CM | POA: Diagnosis not present

## 2023-07-29 DIAGNOSIS — Z1152 Encounter for screening for COVID-19: Secondary | ICD-10-CM

## 2023-07-29 DIAGNOSIS — Z885 Allergy status to narcotic agent status: Secondary | ICD-10-CM

## 2023-07-29 DIAGNOSIS — Z888 Allergy status to other drugs, medicaments and biological substances status: Secondary | ICD-10-CM | POA: Diagnosis not present

## 2023-07-29 DIAGNOSIS — Z8249 Family history of ischemic heart disease and other diseases of the circulatory system: Secondary | ICD-10-CM

## 2023-07-29 DIAGNOSIS — I10 Essential (primary) hypertension: Secondary | ICD-10-CM | POA: Diagnosis not present

## 2023-07-29 DIAGNOSIS — E785 Hyperlipidemia, unspecified: Secondary | ICD-10-CM | POA: Diagnosis not present

## 2023-07-29 DIAGNOSIS — Z825 Family history of asthma and other chronic lower respiratory diseases: Secondary | ICD-10-CM

## 2023-07-29 DIAGNOSIS — H409 Unspecified glaucoma: Secondary | ICD-10-CM | POA: Diagnosis present

## 2023-07-29 DIAGNOSIS — E039 Hypothyroidism, unspecified: Secondary | ICD-10-CM | POA: Diagnosis present

## 2023-07-29 DIAGNOSIS — Z79899 Other long term (current) drug therapy: Secondary | ICD-10-CM

## 2023-07-29 DIAGNOSIS — I97190 Other postprocedural cardiac functional disturbances following cardiac surgery: Secondary | ICD-10-CM | POA: Diagnosis not present

## 2023-07-29 DIAGNOSIS — I4891 Unspecified atrial fibrillation: Secondary | ICD-10-CM | POA: Diagnosis not present

## 2023-07-29 DIAGNOSIS — Z452 Encounter for adjustment and management of vascular access device: Secondary | ICD-10-CM | POA: Diagnosis not present

## 2023-07-29 DIAGNOSIS — I25118 Atherosclerotic heart disease of native coronary artery with other forms of angina pectoris: Principal | ICD-10-CM

## 2023-07-29 DIAGNOSIS — E782 Mixed hyperlipidemia: Secondary | ICD-10-CM | POA: Diagnosis not present

## 2023-07-29 DIAGNOSIS — Z48812 Encounter for surgical aftercare following surgery on the circulatory system: Secondary | ICD-10-CM | POA: Diagnosis not present

## 2023-07-29 DIAGNOSIS — G250 Essential tremor: Secondary | ICD-10-CM | POA: Diagnosis present

## 2023-07-29 DIAGNOSIS — N179 Acute kidney failure, unspecified: Secondary | ICD-10-CM | POA: Diagnosis not present

## 2023-07-29 DIAGNOSIS — E1165 Type 2 diabetes mellitus with hyperglycemia: Secondary | ICD-10-CM | POA: Diagnosis not present

## 2023-07-29 DIAGNOSIS — D6959 Other secondary thrombocytopenia: Secondary | ICD-10-CM | POA: Diagnosis not present

## 2023-07-29 DIAGNOSIS — Z79890 Hormone replacement therapy (postmenopausal): Secondary | ICD-10-CM | POA: Diagnosis not present

## 2023-07-29 DIAGNOSIS — Z951 Presence of aortocoronary bypass graft: Principal | ICD-10-CM

## 2023-07-29 DIAGNOSIS — T40695A Adverse effect of other narcotics, initial encounter: Secondary | ICD-10-CM | POA: Diagnosis not present

## 2023-07-29 DIAGNOSIS — I2510 Atherosclerotic heart disease of native coronary artery without angina pectoris: Secondary | ICD-10-CM | POA: Diagnosis not present

## 2023-07-29 DIAGNOSIS — Z4682 Encounter for fitting and adjustment of non-vascular catheter: Secondary | ICD-10-CM | POA: Diagnosis not present

## 2023-07-29 DIAGNOSIS — R0989 Other specified symptoms and signs involving the circulatory and respiratory systems: Secondary | ICD-10-CM | POA: Diagnosis not present

## 2023-07-29 DIAGNOSIS — R112 Nausea with vomiting, unspecified: Secondary | ICD-10-CM | POA: Diagnosis not present

## 2023-07-29 DIAGNOSIS — J9811 Atelectasis: Secondary | ICD-10-CM | POA: Diagnosis not present

## 2023-07-29 DIAGNOSIS — I361 Nonrheumatic tricuspid (valve) insufficiency: Secondary | ICD-10-CM | POA: Diagnosis not present

## 2023-07-29 HISTORY — PX: TEE WITHOUT CARDIOVERSION: SHX5443

## 2023-07-29 HISTORY — PX: CORONARY ARTERY BYPASS GRAFT: SHX141

## 2023-07-29 LAB — POCT I-STAT 7, (LYTES, BLD GAS, ICA,H+H)
Acid-base deficit: 3 mmol/L — ABNORMAL HIGH (ref 0.0–2.0)
Acid-base deficit: 7 mmol/L — ABNORMAL HIGH (ref 0.0–2.0)
Acid-base deficit: 2 mmol/L (ref 0.0–2.0)
Acid-base deficit: 4 mmol/L — ABNORMAL HIGH (ref 0.0–2.0)
Bicarbonate: 24.2 mmol/L (ref 20.0–28.0)
Bicarbonate: 16.4 mmol/L — ABNORMAL LOW (ref 20.0–28.0)
Bicarbonate: 20.5 mmol/L (ref 20.0–28.0)
Bicarbonate: 21.7 mmol/L (ref 20.0–28.0)
Calcium, Ion: 1.27 mmol/L (ref 1.15–1.40)
Calcium, Ion: 0.82 mmol/L — CL (ref 1.15–1.40)
Calcium, Ion: 1.23 mmol/L (ref 1.15–1.40)
Calcium, Ion: 1.32 mmol/L (ref 1.15–1.40)
HCT: 34 % — ABNORMAL LOW (ref 36.0–46.0)
HCT: 20 % — ABNORMAL LOW (ref 36.0–46.0)
HCT: 31 % — ABNORMAL LOW (ref 36.0–46.0)
HCT: 29 % — ABNORMAL LOW (ref 36.0–46.0)
Hemoglobin: 11.6 g/dL — ABNORMAL LOW (ref 12.0–15.0)
Hemoglobin: 6.8 g/dL — CL (ref 12.0–15.0)
Hemoglobin: 10.5 g/dL — ABNORMAL LOW (ref 12.0–15.0)
Hemoglobin: 9.9 g/dL — ABNORMAL LOW (ref 12.0–15.0)
O2 Saturation: 100 %
O2 Saturation: 100 %
O2 Saturation: 100 %
O2 Saturation: 96 %
Patient temperature: 34.6
Potassium: 3 mmol/L — ABNORMAL LOW (ref 3.5–5.1)
Potassium: 3.2 mmol/L — ABNORMAL LOW (ref 3.5–5.1)
Potassium: 3 mmol/L — ABNORMAL LOW (ref 3.5–5.1)
Potassium: 2.7 mmol/L — CL (ref 3.5–5.1)
Sodium: 140 mmol/L (ref 135–145)
Sodium: 130 mmol/L — ABNORMAL LOW (ref 135–145)
Sodium: 138 mmol/L (ref 135–145)
Sodium: 142 mmol/L (ref 135–145)
TCO2: 26 mmol/L (ref 22–32)
TCO2: 17 mmol/L — ABNORMAL LOW (ref 22–32)
TCO2: 21 mmol/L — ABNORMAL LOW (ref 22–32)
TCO2: 23 mmol/L (ref 22–32)
pCO2 arterial: 52.9 mm[Hg] — ABNORMAL HIGH (ref 32–48)
pCO2 arterial: 25 mm[Hg] — ABNORMAL LOW (ref 32–48)
pCO2 arterial: 25.2 mm[Hg] — ABNORMAL LOW (ref 32–48)
pCO2 arterial: 35.2 mm[Hg] (ref 32–48)
pH, Arterial: 7.267 — ABNORMAL LOW (ref 7.35–7.45)
pH, Arterial: 7.426 (ref 7.35–7.45)
pH, Arterial: 7.517 — ABNORMAL HIGH (ref 7.35–7.45)
pH, Arterial: 7.387 (ref 7.35–7.45)
pO2, Arterial: 300 mm[Hg] — ABNORMAL HIGH (ref 83–108)
pO2, Arterial: 409 mm[Hg] — ABNORMAL HIGH (ref 83–108)
pO2, Arterial: 397 mm[Hg] — ABNORMAL HIGH (ref 83–108)
pO2, Arterial: 71 mm[Hg] — ABNORMAL LOW (ref 83–108)

## 2023-07-29 LAB — POCT I-STAT, CHEM 8
BUN: 20 mg/dL (ref 8–23)
BUN: 13 mg/dL (ref 8–23)
BUN: 16 mg/dL (ref 8–23)
BUN: 18 mg/dL (ref 8–23)
BUN: 15 mg/dL (ref 8–23)
BUN: 17 mg/dL (ref 8–23)
Calcium, Ion: 1.27 mmol/L (ref 1.15–1.40)
Calcium, Ion: 0.84 mmol/L — CL (ref 1.15–1.40)
Calcium, Ion: 0.99 mmol/L — ABNORMAL LOW (ref 1.15–1.40)
Calcium, Ion: 1.19 mmol/L (ref 1.15–1.40)
Calcium, Ion: 0.98 mmol/L — ABNORMAL LOW (ref 1.15–1.40)
Calcium, Ion: 1.26 mmol/L (ref 1.15–1.40)
Chloride: 102 mmol/L (ref 98–111)
Chloride: 102 mmol/L (ref 98–111)
Chloride: 94 mmol/L — ABNORMAL LOW (ref 98–111)
Chloride: 96 mmol/L — ABNORMAL LOW (ref 98–111)
Chloride: 105 mmol/L (ref 98–111)
Chloride: 98 mmol/L (ref 98–111)
Creatinine, Ser: 0.6 mg/dL (ref 0.44–1.00)
Creatinine, Ser: 0.3 mg/dL — ABNORMAL LOW (ref 0.44–1.00)
Creatinine, Ser: 0.5 mg/dL (ref 0.44–1.00)
Creatinine, Ser: 0.5 mg/dL (ref 0.44–1.00)
Creatinine, Ser: 0.4 mg/dL — ABNORMAL LOW (ref 0.44–1.00)
Creatinine, Ser: 0.5 mg/dL (ref 0.44–1.00)
Glucose, Bld: 171 mg/dL — ABNORMAL HIGH (ref 70–99)
Glucose, Bld: 122 mg/dL — ABNORMAL HIGH (ref 70–99)
Glucose, Bld: 134 mg/dL — ABNORMAL HIGH (ref 70–99)
Glucose, Bld: 173 mg/dL — ABNORMAL HIGH (ref 70–99)
Glucose, Bld: 146 mg/dL — ABNORMAL HIGH (ref 70–99)
Glucose, Bld: 162 mg/dL — ABNORMAL HIGH (ref 70–99)
HCT: 34 % — ABNORMAL LOW (ref 36.0–46.0)
HCT: 20 % — ABNORMAL LOW (ref 36.0–46.0)
HCT: 21 % — ABNORMAL LOW (ref 36.0–46.0)
HCT: 29 % — ABNORMAL LOW (ref 36.0–46.0)
HCT: 26 % — ABNORMAL LOW (ref 36.0–46.0)
HCT: 30 % — ABNORMAL LOW (ref 36.0–46.0)
Hemoglobin: 11.6 g/dL — ABNORMAL LOW (ref 12.0–15.0)
Hemoglobin: 6.8 g/dL — CL (ref 12.0–15.0)
Hemoglobin: 7.1 g/dL — ABNORMAL LOW (ref 12.0–15.0)
Hemoglobin: 9.9 g/dL — ABNORMAL LOW (ref 12.0–15.0)
Hemoglobin: 10.2 g/dL — ABNORMAL LOW (ref 12.0–15.0)
Hemoglobin: 8.8 g/dL — ABNORMAL LOW (ref 12.0–15.0)
Potassium: 2.9 mmol/L — ABNORMAL LOW (ref 3.5–5.1)
Potassium: 2.8 mmol/L — ABNORMAL LOW (ref 3.5–5.1)
Potassium: 3.2 mmol/L — ABNORMAL LOW (ref 3.5–5.1)
Potassium: 3.2 mmol/L — ABNORMAL LOW (ref 3.5–5.1)
Potassium: 3 mmol/L — ABNORMAL LOW (ref 3.5–5.1)
Potassium: 4.2 mmol/L (ref 3.5–5.1)
Sodium: 141 mmol/L (ref 135–145)
Sodium: 130 mmol/L — ABNORMAL LOW (ref 135–145)
Sodium: 134 mmol/L — ABNORMAL LOW (ref 135–145)
Sodium: 138 mmol/L (ref 135–145)
Sodium: 136 mmol/L (ref 135–145)
Sodium: 138 mmol/L (ref 135–145)
TCO2: 26 mmol/L (ref 22–32)
TCO2: 19 mmol/L — ABNORMAL LOW (ref 22–32)
TCO2: 23 mmol/L (ref 22–32)
TCO2: 25 mmol/L (ref 22–32)
TCO2: 23 mmol/L (ref 22–32)
TCO2: 25 mmol/L (ref 22–32)

## 2023-07-29 LAB — GLUCOSE, CAPILLARY
Glucose-Capillary: 106 mg/dL — ABNORMAL HIGH (ref 70–99)
Glucose-Capillary: 107 mg/dL — ABNORMAL HIGH (ref 70–99)
Glucose-Capillary: 108 mg/dL — ABNORMAL HIGH (ref 70–99)
Glucose-Capillary: 123 mg/dL — ABNORMAL HIGH (ref 70–99)
Glucose-Capillary: 125 mg/dL — ABNORMAL HIGH (ref 70–99)
Glucose-Capillary: 133 mg/dL — ABNORMAL HIGH (ref 70–99)
Glucose-Capillary: 135 mg/dL — ABNORMAL HIGH (ref 70–99)
Glucose-Capillary: 135 mg/dL — ABNORMAL HIGH (ref 70–99)
Glucose-Capillary: 146 mg/dL — ABNORMAL HIGH (ref 70–99)
Glucose-Capillary: 147 mg/dL — ABNORMAL HIGH (ref 70–99)
Glucose-Capillary: 157 mg/dL — ABNORMAL HIGH (ref 70–99)
Glucose-Capillary: 95 mg/dL (ref 70–99)

## 2023-07-29 LAB — PROTIME-INR
INR: 1.5 — ABNORMAL HIGH (ref 0.8–1.2)
Prothrombin Time: 18.5 s — ABNORMAL HIGH (ref 11.4–15.2)

## 2023-07-29 LAB — ABO/RH: ABO/RH(D): A POS

## 2023-07-29 LAB — CBC
HCT: 30.9 % — ABNORMAL LOW (ref 36.0–46.0)
HCT: 30.5 % — ABNORMAL LOW (ref 36.0–46.0)
Hemoglobin: 10.2 g/dL — ABNORMAL LOW (ref 12.0–15.0)
Hemoglobin: 10.4 g/dL — ABNORMAL LOW (ref 12.0–15.0)
MCH: 29.6 pg (ref 26.0–34.0)
MCH: 30.6 pg (ref 26.0–34.0)
MCHC: 33 g/dL (ref 30.0–36.0)
MCHC: 34.1 g/dL (ref 30.0–36.0)
MCV: 89.6 fL (ref 80.0–100.0)
MCV: 89.7 fL (ref 80.0–100.0)
Platelets: 104 10*3/uL — ABNORMAL LOW (ref 150–400)
Platelets: 103 10*3/uL — ABNORMAL LOW (ref 150–400)
RBC: 3.45 MIL/uL — ABNORMAL LOW (ref 3.87–5.11)
RBC: 3.4 MIL/uL — ABNORMAL LOW (ref 3.87–5.11)
RDW: 14.6 % (ref 11.5–15.5)
RDW: 14.9 % (ref 11.5–15.5)
WBC: 11.8 10*3/uL — ABNORMAL HIGH (ref 4.0–10.5)
WBC: 8.7 10*3/uL (ref 4.0–10.5)
nRBC: 0 % (ref 0.0–0.2)
nRBC: 0 % (ref 0.0–0.2)

## 2023-07-29 LAB — BASIC METABOLIC PANEL
Anion gap: 10 (ref 5–15)
BUN: 14 mg/dL (ref 8–23)
CO2: 24 mmol/L (ref 22–32)
Calcium: 9.3 mg/dL (ref 8.9–10.3)
Chloride: 107 mmol/L (ref 98–111)
Creatinine, Ser: 0.78 mg/dL (ref 0.44–1.00)
GFR, Estimated: >60 mL/min (ref >60)
Glucose, Bld: 108 mg/dL — ABNORMAL HIGH (ref 70–99)
Potassium: 3.9 mmol/L (ref 3.5–5.1)
Sodium: 141 mmol/L (ref 135–145)

## 2023-07-29 LAB — HEMOGLOBIN AND HEMATOCRIT, BLOOD
HCT: 26.5 % — ABNORMAL LOW (ref 36.0–46.0)
Hemoglobin: 8.7 g/dL — ABNORMAL LOW (ref 12.0–15.0)

## 2023-07-29 LAB — MAGNESIUM: Magnesium: 3.1 mg/dL — ABNORMAL HIGH (ref 1.7–2.4)

## 2023-07-29 LAB — PREPARE RBC (CROSSMATCH)

## 2023-07-29 LAB — PLATELET COUNT: Platelets: 118 10*3/uL — ABNORMAL LOW (ref 150–400)

## 2023-07-29 LAB — APTT: aPTT: 36 s (ref 24–36)

## 2023-07-29 SURGERY — CORONARY ARTERY BYPASS GRAFTING (CABG)
Anesthesia: General | Site: Chest

## 2023-07-29 MED ORDER — ASPIRIN 325 MG PO TBEC
325.0000 mg | DELAYED_RELEASE_TABLET | Freq: Every day | ORAL | Status: DC
  Administered 2023-07-30 – 2023-08-01 (×3): 325 mg via ORAL
  Filled 2023-07-29 (×3): qty 1

## 2023-07-29 MED ORDER — CALCIUM CHLORIDE 10 % IV SOLN
INTRAVENOUS | Status: AC
  Filled 2023-07-29: qty 10

## 2023-07-29 MED ORDER — GLYCOPYRROLATE 0.2 MG/ML IJ SOLN
INTRAMUSCULAR | Status: DC | PRN
  Administered 2023-07-29: .2 mg via INTRAVENOUS

## 2023-07-29 MED ORDER — BISACODYL 10 MG RE SUPP: 10.0000 mg | Freq: Every day | RECTAL | Status: DC

## 2023-07-29 MED ORDER — METOPROLOL TARTRATE 5 MG/5ML IV SOLN: 2.5000 mg | INTRAVENOUS | Status: DC | PRN

## 2023-07-29 MED ORDER — ACETAMINOPHEN 500 MG PO TABS
1000.0000 mg | ORAL_TABLET | Freq: Four times a day (QID) | ORAL | Status: DC
  Administered 2023-07-29 – 2023-08-03 (×17): 1000 mg via ORAL
  Filled 2023-07-29 (×18): qty 2

## 2023-07-29 MED ORDER — PROPOFOL 10 MG/ML IV BOLUS
INTRAVENOUS | Status: AC
  Filled 2023-07-29: qty 20

## 2023-07-29 MED ORDER — MIDAZOLAM HCL 2 MG/2ML IJ SOLN
INTRAMUSCULAR | Status: AC
  Filled 2023-07-29: qty 2

## 2023-07-29 MED ORDER — FENTANYL CITRATE (PF) 250 MCG/5ML IJ SOLN
INTRAMUSCULAR | Status: AC
  Filled 2023-07-29: qty 5

## 2023-07-29 MED ORDER — SODIUM CHLORIDE 0.9 % IV SOLN: INTRAVENOUS | Status: DC

## 2023-07-29 MED ORDER — PLASMA-LYTE A IV SOLN
INTRAVENOUS | Status: DC | PRN
  Administered 2023-07-29: 500 mL

## 2023-07-29 MED ORDER — METOCLOPRAMIDE HCL 5 MG/ML IJ SOLN
10.0000 mg | Freq: Four times a day (QID) | INTRAMUSCULAR | Status: AC
  Administered 2023-07-29 – 2023-07-30 (×6): 10 mg via INTRAVENOUS
  Filled 2023-07-29 (×6): qty 2

## 2023-07-29 MED ORDER — NOREPINEPHRINE 4 MG/250ML-% IV SOLN
INTRAVENOUS | Status: AC
  Filled 2023-07-29: qty 250

## 2023-07-29 MED ORDER — DEXAMETHASONE SODIUM PHOSPHATE 10 MG/ML IJ SOLN: INTRAMUSCULAR | Status: DC | PRN

## 2023-07-29 MED ORDER — CEFAZOLIN SODIUM-DEXTROSE 2-4 GM/100ML-% IV SOLN
2.0000 g | Freq: Three times a day (TID) | INTRAVENOUS | Status: AC
  Administered 2023-07-29 – 2023-07-31 (×6): 2 g via INTRAVENOUS
  Filled 2023-07-29 (×6): qty 100

## 2023-07-29 MED ORDER — MAGNESIUM SULFATE 4 GM/100ML IV SOLN
4.0000 g | Freq: Once | INTRAVENOUS | Status: AC
  Administered 2023-07-29: 4 g via INTRAVENOUS
  Filled 2023-07-29: qty 100

## 2023-07-29 MED ORDER — SODIUM CHLORIDE 0.9% FLUSH
3.0000 mL | Freq: Two times a day (BID) | INTRAVENOUS | Status: DC
  Administered 2023-07-30 – 2023-08-01 (×5): 3 mL via INTRAVENOUS

## 2023-07-29 MED ORDER — METOPROLOL TARTRATE 12.5 MG HALF TABLET: 12.5000 mg | ORAL_TABLET | Freq: Two times a day (BID) | ORAL | Status: DC

## 2023-07-29 MED ORDER — DEXTROSE 50 % IV SOLN
0.0000 mL | INTRAVENOUS | Status: DC | PRN
Start: 2023-07-29 — End: 2023-08-01

## 2023-07-29 MED ORDER — CHLORHEXIDINE GLUCONATE 0.12 % MT SOLN
15.0000 mL | Freq: Once | OROMUCOSAL | Status: DC
  Filled 2023-07-29: qty 15

## 2023-07-29 MED ORDER — SODIUM CHLORIDE 0.9% FLUSH: 3.0000 mL | INTRAVENOUS | Status: DC | PRN

## 2023-07-29 MED ORDER — ASPIRIN 81 MG PO CHEW
324.0000 mg | CHEWABLE_TABLET | Freq: Once | ORAL | Status: AC
  Administered 2023-07-29: 324 mg via ORAL
  Filled 2023-07-29: qty 4

## 2023-07-29 MED ORDER — SODIUM CHLORIDE 0.9 % IV SOLN: INTRAVENOUS | Status: DC | PRN

## 2023-07-29 MED ORDER — PANTOPRAZOLE SODIUM 40 MG PO TBEC
40.0000 mg | DELAYED_RELEASE_TABLET | Freq: Every day | ORAL | Status: DC
  Administered 2023-07-31 – 2023-08-03 (×4): 40 mg via ORAL
  Filled 2023-07-29 (×4): qty 1

## 2023-07-29 MED ORDER — ROCURONIUM BROMIDE 10 MG/ML (PF) SYRINGE
PREFILLED_SYRINGE | INTRAVENOUS | Status: DC | PRN
  Administered 2023-07-29: 70 mg via INTRAVENOUS
  Administered 2023-07-29: 30 mg via INTRAVENOUS

## 2023-07-29 MED ORDER — LIDOCAINE 2% (20 MG/ML) 5 ML SYRINGE
INTRAMUSCULAR | Status: AC
  Filled 2023-07-29: qty 5

## 2023-07-29 MED ORDER — METOPROLOL TARTRATE 12.5 MG HALF TABLET
12.5000 mg | ORAL_TABLET | Freq: Once | ORAL | Status: DC
  Filled 2023-07-29: qty 1

## 2023-07-29 MED ORDER — DOCUSATE SODIUM 100 MG PO CAPS
200.0000 mg | ORAL_CAPSULE | Freq: Every day | ORAL | Status: DC
  Administered 2023-07-30 – 2023-08-03 (×5): 200 mg via ORAL
  Filled 2023-07-29 (×5): qty 2

## 2023-07-29 MED ORDER — PROTAMINE SULFATE 10 MG/ML IV SOLN
INTRAVENOUS | Status: DC | PRN
  Administered 2023-07-29: 210 mg via INTRAVENOUS

## 2023-07-29 MED ORDER — INSULIN REGULAR(HUMAN) IN NACL 100-0.9 UT/100ML-% IV SOLN: INTRAVENOUS | Status: DC

## 2023-07-29 MED ORDER — CHLORHEXIDINE GLUCONATE CLOTH 2 % EX PADS
6.0000 | MEDICATED_PAD | Freq: Every day | CUTANEOUS | Status: DC
  Administered 2023-07-29 – 2023-08-01 (×4): 6 via TOPICAL

## 2023-07-29 MED ORDER — ASPIRIN 81 MG PO CHEW: 324.0000 mg | CHEWABLE_TABLET | Freq: Every day | ORAL | Status: DC

## 2023-07-29 MED ORDER — MIDAZOLAM HCL 2 MG/2ML IJ SOLN: 2.0000 mg | INTRAMUSCULAR | Status: DC | PRN

## 2023-07-29 MED ORDER — ORAL CARE MOUTH RINSE: 15.0000 mL | OROMUCOSAL | Status: DC | PRN

## 2023-07-29 MED ORDER — PROTAMINE SULFATE 10 MG/ML IV SOLN
INTRAVENOUS | Status: AC
  Filled 2023-07-29: qty 25

## 2023-07-29 MED ORDER — CLEVIDIPINE BUTYRATE 0.5 MG/ML IV EMUL
2.0000 mg/h | INTRAVENOUS | Status: DC
  Administered 2023-07-29: 2 mg/h via INTRAVENOUS
  Filled 2023-07-29: qty 50

## 2023-07-29 MED ORDER — DEXMEDETOMIDINE HCL IN NACL 400 MCG/100ML IV SOLN: 0.0000 ug/kg/h | INTRAVENOUS | Status: DC

## 2023-07-29 MED ORDER — VANCOMYCIN HCL IN DEXTROSE 1-5 GM/200ML-% IV SOLN
1000.0000 mg | Freq: Once | INTRAVENOUS | Status: AC
  Administered 2023-07-29: 1000 mg via INTRAVENOUS
  Filled 2023-07-29: qty 200

## 2023-07-29 MED ORDER — 0.9 % SODIUM CHLORIDE (POUR BTL) OPTIME
TOPICAL | Status: DC | PRN
  Administered 2023-07-29: 5000 mL

## 2023-07-29 MED ORDER — LEVOTHYROXINE SODIUM 75 MCG PO TABS
75.0000 ug | ORAL_TABLET | Freq: Every day | ORAL | Status: DC
  Administered 2023-07-30 – 2023-08-03 (×5): 75 ug via ORAL
  Filled 2023-07-29 (×5): qty 1

## 2023-07-29 MED ORDER — CHLORHEXIDINE GLUCONATE 4 % EX SOLN: 30.0000 mL | CUTANEOUS | Status: DC

## 2023-07-29 MED ORDER — MIDAZOLAM HCL (PF) 5 MG/ML IJ SOLN
INTRAMUSCULAR | Status: DC | PRN
  Administered 2023-07-29 (×2): .5 mg via INTRAVENOUS

## 2023-07-29 MED ORDER — CALCIUM CHLORIDE 10 % IV SOLN
INTRAVENOUS | Status: DC | PRN
Start: 2023-07-29 — End: 2023-07-29
  Administered 2023-07-29: 300 mg via INTRAVENOUS
  Administered 2023-07-29: 200 mg via INTRAVENOUS

## 2023-07-29 MED ORDER — POTASSIUM CHLORIDE 10 MEQ/50ML IV SOLN
10.0000 meq | INTRAVENOUS | Status: AC
  Administered 2023-07-29 (×3): 10 meq via INTRAVENOUS

## 2023-07-29 MED ORDER — TRAMADOL HCL 50 MG PO TABS
50.0000 mg | ORAL_TABLET | ORAL | Status: DC | PRN
  Administered 2023-07-29: 50 mg via ORAL
  Filled 2023-07-29: qty 1

## 2023-07-29 MED ORDER — LACTATED RINGERS IV SOLN: INTRAVENOUS | Status: AC

## 2023-07-29 MED ORDER — POTASSIUM CHLORIDE 10 MEQ/50ML IV SOLN: 10.0000 meq | INTRAVENOUS | Status: DC

## 2023-07-29 MED ORDER — ONDANSETRON HCL 4 MG/2ML IJ SOLN
4.0000 mg | Freq: Four times a day (QID) | INTRAMUSCULAR | Status: DC | PRN
  Administered 2023-07-29 – 2023-08-01 (×4): 4 mg via INTRAVENOUS
  Filled 2023-07-29 (×6): qty 2

## 2023-07-29 MED ORDER — SODIUM CHLORIDE 0.9 % IV SOLN: 250.0000 mL | INTRAVENOUS | Status: AC

## 2023-07-29 MED ORDER — LACTATED RINGERS IV SOLN: INTRAVENOUS | Status: DC | PRN

## 2023-07-29 MED ORDER — ALBUMIN HUMAN 5 % IV SOLN: INTRAVENOUS | Status: DC | PRN

## 2023-07-29 MED ORDER — ORAL CARE MOUTH RINSE
15.0000 mL | OROMUCOSAL | Status: DC
Start: 2023-07-29 — End: 2023-07-29
  Administered 2023-07-29 (×3): 15 mL via OROMUCOSAL

## 2023-07-29 MED ORDER — NOREPINEPHRINE 4 MG/250ML-% IV SOLN
0.0000 ug/min | INTRAVENOUS | Status: DC
  Administered 2023-07-29: 1 ug/min via INTRAVENOUS
  Administered 2023-07-29: 2 ug/min via INTRAVENOUS

## 2023-07-29 MED ORDER — ALBUMIN HUMAN 5 % IV SOLN
250.0000 mL | INTRAVENOUS | Status: DC | PRN
  Administered 2023-07-29 (×3): 12.5 g via INTRAVENOUS
  Filled 2023-07-29: qty 250

## 2023-07-29 MED ORDER — PRAVASTATIN SODIUM 40 MG PO TABS
20.0000 mg | ORAL_TABLET | Freq: Every day | ORAL | Status: DC
  Administered 2023-07-29 – 2023-08-02 (×5): 20 mg via ORAL
  Filled 2023-07-29 (×3): qty 2
  Filled 2023-07-29: qty 1
  Filled 2023-07-29 (×2): qty 2

## 2023-07-29 MED ORDER — HEPARIN SODIUM (PORCINE) 1000 UNIT/ML IJ SOLN
INTRAMUSCULAR | Status: AC
  Filled 2023-07-29: qty 1

## 2023-07-29 MED ORDER — PHENYLEPHRINE 80 MCG/ML (10ML) SYRINGE FOR IV PUSH (FOR BLOOD PRESSURE SUPPORT)
PREFILLED_SYRINGE | INTRAVENOUS | Status: DC | PRN
  Administered 2023-07-29: 80 ug via INTRAVENOUS
  Administered 2023-07-29: 40 ug via INTRAVENOUS
  Administered 2023-07-29 (×2): 80 ug via INTRAVENOUS
  Administered 2023-07-29 (×3): 160 ug via INTRAVENOUS

## 2023-07-29 MED ORDER — PHENYLEPHRINE 80 MCG/ML (10ML) SYRINGE FOR IV PUSH (FOR BLOOD PRESSURE SUPPORT)
PREFILLED_SYRINGE | INTRAVENOUS | Status: AC
  Filled 2023-07-29: qty 10

## 2023-07-29 MED ORDER — HEPARIN SODIUM (PORCINE) 1000 UNIT/ML IJ SOLN
INTRAMUSCULAR | Status: DC | PRN
  Administered 2023-07-29: 21000 [IU] via INTRAVENOUS

## 2023-07-29 MED ORDER — ~~LOC~~ CARDIAC SURGERY, PATIENT & FAMILY EDUCATION
Freq: Once | Status: AC
  Filled 2023-07-29: qty 1

## 2023-07-29 MED ORDER — POTASSIUM CHLORIDE 10 MEQ/50ML IV SOLN
10.0000 meq | INTRAVENOUS | Status: AC
  Administered 2023-07-29 (×3): 10 meq via INTRAVENOUS
  Filled 2023-07-29 (×3): qty 50

## 2023-07-29 MED ORDER — BISACODYL 5 MG PO TBEC
10.0000 mg | DELAYED_RELEASE_TABLET | Freq: Every day | ORAL | Status: DC
  Administered 2023-07-30 – 2023-07-31 (×2): 10 mg via ORAL
  Filled 2023-07-29 (×2): qty 2

## 2023-07-29 MED ORDER — METOPROLOL TARTRATE 25 MG/10 ML ORAL SUSPENSION: 12.5000 mg | Freq: Two times a day (BID) | ORAL | Status: DC

## 2023-07-29 MED ORDER — ARTIFICIAL TEARS OPHTHALMIC OINT
TOPICAL_OINTMENT | OPHTHALMIC | Status: DC | PRN
  Administered 2023-07-29: 1 via OPHTHALMIC

## 2023-07-29 MED ORDER — STERILE WATER FOR IRRIGATION IR SOLN
Status: DC | PRN
  Administered 2023-07-29: 2000 mL

## 2023-07-29 MED ORDER — ARTIFICIAL TEARS OPHTHALMIC OINT
TOPICAL_OINTMENT | OPHTHALMIC | Status: AC
  Filled 2023-07-29: qty 3.5

## 2023-07-29 MED ORDER — ACETAMINOPHEN 160 MG/5ML PO SOLN
650.0000 mg | Freq: Once | ORAL | Status: AC
  Administered 2023-07-29: 650 mg
  Filled 2023-07-29: qty 20.3

## 2023-07-29 MED ORDER — PROPOFOL 10 MG/ML IV BOLUS
INTRAVENOUS | Status: DC | PRN
  Administered 2023-07-29 (×5): 50 mg via INTRAVENOUS
  Administered 2023-07-29: 40 mg via INTRAVENOUS

## 2023-07-29 MED ORDER — FENTANYL CITRATE (PF) 250 MCG/5ML IJ SOLN
INTRAMUSCULAR | Status: DC | PRN
  Administered 2023-07-29: 100 ug via INTRAVENOUS
  Administered 2023-07-29 (×3): 50 ug via INTRAVENOUS
  Administered 2023-07-29: 100 ug via INTRAVENOUS
  Administered 2023-07-29: 50 ug via INTRAVENOUS
  Administered 2023-07-29: 100 ug via INTRAVENOUS
  Administered 2023-07-29 (×2): 150 ug via INTRAVENOUS
  Administered 2023-07-29: 100 ug via INTRAVENOUS
  Administered 2023-07-29 (×2): 25 ug via INTRAVENOUS

## 2023-07-29 MED ORDER — SODIUM CHLORIDE 0.45 % IV SOLN: INTRAVENOUS | Status: DC | PRN

## 2023-07-29 MED ORDER — PANTOPRAZOLE SODIUM 40 MG IV SOLR
40.0000 mg | Freq: Every day | INTRAVENOUS | Status: AC
  Administered 2023-07-29 – 2023-07-30 (×2): 40 mg via INTRAVENOUS
  Filled 2023-07-29 (×2): qty 10

## 2023-07-29 MED ORDER — ACETAMINOPHEN 160 MG/5ML PO SOLN: 1000.0000 mg | Freq: Four times a day (QID) | ORAL | Status: DC

## 2023-07-29 MED ORDER — MORPHINE SULFATE (PF) 2 MG/ML IV SOLN
1.0000 mg | INTRAVENOUS | Status: DC | PRN
  Administered 2023-07-29 – 2023-08-01 (×5): 2 mg via INTRAVENOUS
  Filled 2023-07-29 (×5): qty 1

## 2023-07-29 MED ORDER — ROCURONIUM BROMIDE 10 MG/ML (PF) SYRINGE
PREFILLED_SYRINGE | INTRAVENOUS | Status: AC
  Filled 2023-07-29: qty 10

## 2023-07-29 MED ORDER — DORZOLAMIDE HCL-TIMOLOL MAL 2-0.5 % OP SOLN
1.0000 [drp] | Freq: Two times a day (BID) | OPHTHALMIC | Status: DC
Start: 2023-07-29 — End: 2023-08-03
  Administered 2023-07-29 – 2023-08-03 (×11): 1 [drp] via OPHTHALMIC
  Filled 2023-07-29: qty 10

## 2023-07-29 MED ORDER — OXYCODONE HCL 5 MG PO TABS
5.0000 mg | ORAL_TABLET | ORAL | Status: DC | PRN
  Administered 2023-07-30: 5 mg via ORAL
  Filled 2023-07-29: qty 1

## 2023-07-29 MED ORDER — NICARDIPINE HCL IN NACL 20-0.86 MG/200ML-% IV SOLN
3.0000 mg/h | INTRAVENOUS | Status: DC
  Administered 2023-07-29: 5 mg/h via INTRAVENOUS
  Administered 2023-07-29: 3 mg/h via INTRAVENOUS
  Filled 2023-07-29: qty 200

## 2023-07-29 MED ORDER — LATANOPROSTENE BUNOD 0.024 % OP SOLN
1.0000 [drp] | Freq: Every day | OPHTHALMIC | Status: DC
  Administered 2023-07-29: 1 [drp] via OPHTHALMIC

## 2023-07-29 MED ORDER — CHLORHEXIDINE GLUCONATE 0.12 % MT SOLN
15.0000 mL | OROMUCOSAL | Status: AC
  Administered 2023-07-29: 15 mL via OROMUCOSAL

## 2023-07-29 SURGICAL SUPPLY — 88 items
ADH SKN CLS APL DERMABOND .7 (GAUZE/BANDAGES/DRESSINGS) ×2
ANTIFOG SOL W/FOAM PAD STRL (MISCELLANEOUS) ×2
BAG DECANTER FOR FLEXI CONT (MISCELLANEOUS) ×2 IMPLANT
BLADE CLIPPER SURG (BLADE) ×2 IMPLANT
BLADE STERNUM SYSTEM 6 (BLADE) ×2 IMPLANT
BLADE SURG 11 STRL SS (BLADE) IMPLANT
BNDG CMPR 5X4 KNIT ELC UNQ LF (GAUZE/BANDAGES/DRESSINGS) ×2
BNDG CMPR MED 10X6 ELC LF (GAUZE/BANDAGES/DRESSINGS) ×2
BNDG ELASTIC 4INX 5YD STR LF (GAUZE/BANDAGES/DRESSINGS) IMPLANT
BNDG ELASTIC 4X5.8 VLCR STR LF (GAUZE/BANDAGES/DRESSINGS) ×2 IMPLANT
BNDG ELASTIC 6X10 VLCR STRL LF (GAUZE/BANDAGES/DRESSINGS) IMPLANT
BNDG ELASTIC 6X5.8 VLCR STR LF (GAUZE/BANDAGES/DRESSINGS) ×2 IMPLANT
BNDG GAUZE DERMACEA FLUFF 4 (GAUZE/BANDAGES/DRESSINGS) ×2 IMPLANT
BNDG GZE DERMACEA 4 6PLY (GAUZE/BANDAGES/DRESSINGS) ×2
CABLE SURGICAL S-101-97-12 (CABLE) ×2 IMPLANT
CANISTER SUCT 3000ML PPV (MISCELLANEOUS) ×2 IMPLANT
CANNULA MC2 2 STG 29/37 NON-V (CANNULA) ×2 IMPLANT
CANNULA NON VENT 20FR 12 (CANNULA) ×2 IMPLANT
CANNULA VEN 2 STAGE (MISCELLANEOUS) IMPLANT
CATH ROBINSON RED A/P 18FR (CATHETERS) ×4 IMPLANT
CONN ST 1/2X1/2 BEN (MISCELLANEOUS) ×2 IMPLANT
CONNECTOR BLAKE 2:1 CARIO BLK (MISCELLANEOUS) ×2 IMPLANT
CONTAINER PROTECT SURGISLUSH (MISCELLANEOUS) ×4 IMPLANT
DERMABOND ADVANCED .7 DNX12 (GAUZE/BANDAGES/DRESSINGS) IMPLANT
DRAIN CHANNEL 19F RND (DRAIN) ×6 IMPLANT
DRAIN CONNECTOR BLAKE 1:1 (MISCELLANEOUS) ×2 IMPLANT
DRAPE CARDIOVASCULAR INCISE (DRAPES) ×2
DRAPE SRG 135X102X78XABS (DRAPES) ×2 IMPLANT
DRAPE WARM FLUID 44X44 (DRAPES) ×2 IMPLANT
DRSG AQUACEL AG ADV 3.5X10 (GAUZE/BANDAGES/DRESSINGS) ×2 IMPLANT
DRSG COVADERM 4X10 (GAUZE/BANDAGES/DRESSINGS) IMPLANT
ELECT BLADE 4.0 EZ CLEAN MEGAD (MISCELLANEOUS) ×2
ELECT REM PT RETURN 9FT ADLT (ELECTROSURGICAL) ×4
ELECTRODE BLDE 4.0 EZ CLN MEGD (MISCELLANEOUS) ×2 IMPLANT
ELECTRODE REM PT RTRN 9FT ADLT (ELECTROSURGICAL) ×4 IMPLANT
FELT TEFLON 1X6 (MISCELLANEOUS) ×4 IMPLANT
GAUZE SPONGE 4X4 12PLY STRL (GAUZE/BANDAGES/DRESSINGS) ×4 IMPLANT
GAUZE SPONGE 4X4 12PLY STRL LF (GAUZE/BANDAGES/DRESSINGS) IMPLANT
GLOVE BIO SURGEON STRL SZ 6.5 (GLOVE) IMPLANT
GLOVE BIO SURGEON STRL SZ7 (GLOVE) ×4 IMPLANT
GLOVE BIOGEL M STRL SZ7.5 (GLOVE) ×4 IMPLANT
GLOVE BIOGEL PI IND STRL 6 (GLOVE) IMPLANT
GOWN STRL REUS W/ TWL LRG LVL3 (GOWN DISPOSABLE) ×8 IMPLANT
GOWN STRL REUS W/ TWL XL LVL3 (GOWN DISPOSABLE) ×4 IMPLANT
GOWN STRL REUS W/TWL LRG LVL3 (GOWN DISPOSABLE) ×12
GOWN STRL REUS W/TWL XL LVL3 (GOWN DISPOSABLE) ×4
HEMOSTAT POWDER SURGIFOAM 1G (HEMOSTASIS) ×4 IMPLANT
INSERT FOGARTY XLG (MISCELLANEOUS) IMPLANT
INSERT SUTURE HOLDER (MISCELLANEOUS) ×2 IMPLANT
KIT BASIN OR (CUSTOM PROCEDURE TRAY) ×2 IMPLANT
KIT TURNOVER KIT B (KITS) ×2 IMPLANT
KIT VASOVIEW HEMOPRO 2 VH 4000 (KITS) ×2 IMPLANT
LEAD PACING MYOCARDI (MISCELLANEOUS) ×2 IMPLANT
MARKER GRAFT CORONARY BYPASS (MISCELLANEOUS) ×6 IMPLANT
NS IRRIG 1000ML POUR BTL (IV SOLUTION) ×10 IMPLANT
PACK E OPEN HEART (SUTURE) ×2 IMPLANT
PACK OPEN HEART (CUSTOM PROCEDURE TRAY) ×2 IMPLANT
PAD ARMBOARD 7.5X6 YLW CONV (MISCELLANEOUS) ×4 IMPLANT
PAD ELECT DEFIB RADIOL ZOLL (MISCELLANEOUS) ×2 IMPLANT
PENCIL BUTTON HOLSTER BLD 10FT (ELECTRODE) ×2 IMPLANT
POSITIONER HEAD DONUT 9IN (MISCELLANEOUS) ×2 IMPLANT
PUNCH AORTIC ROTATE 4.0MM (MISCELLANEOUS) ×2 IMPLANT
SET MPS 3-ND DEL (MISCELLANEOUS) IMPLANT
SOLUTION ANTFG W/FOAM PAD STRL (MISCELLANEOUS) IMPLANT
STOPCOCK 4 WAY LG BORE MALE ST (IV SETS) IMPLANT
SUPPORT HEART JANKE-BARRON (MISCELLANEOUS) ×2 IMPLANT
SUT ETHIBOND X763 2 0 SH 1 (SUTURE) ×4 IMPLANT
SUT MNCRL AB 3-0 PS2 18 (SUTURE) ×4 IMPLANT
SUT MNCRL AB 4-0 PS2 18 (SUTURE) IMPLANT
SUT PDS AB 1 CTX 36 (SUTURE) ×4 IMPLANT
SUT PROLENE 4 0 RB 1 (SUTURE) ×2
SUT PROLENE 4 0 SH DA (SUTURE) ×2 IMPLANT
SUT PROLENE 4-0 RB1 18X2 ARM (SUTURE) IMPLANT
SUT PROLENE 5 0 C 1 36 (SUTURE) ×6 IMPLANT
SUT PROLENE 7 0 BV 1 (SUTURE) IMPLANT
SUT PROLENE 7 0 BV1 MDA (SUTURE) ×2 IMPLANT
SUT STEEL 6MS V (SUTURE) ×4 IMPLANT
SUT VIC AB 2-0 CT1 27 (SUTURE) ×2
SUT VIC AB 2-0 CT1 TAPERPNT 27 (SUTURE) IMPLANT
SYSTEM SAHARA CHEST DRAIN ATS (WOUND CARE) ×2 IMPLANT
TAPE CLOTH SURG 4X10 WHT LF (GAUZE/BANDAGES/DRESSINGS) IMPLANT
TAPE PAPER 2X10 WHT MICROPORE (GAUZE/BANDAGES/DRESSINGS) IMPLANT
TOWEL GREEN STERILE (TOWEL DISPOSABLE) ×2 IMPLANT
TOWEL GREEN STERILE FF (TOWEL DISPOSABLE) ×2 IMPLANT
TRAY FOLEY SLVR 16FR TEMP STAT (SET/KITS/TRAYS/PACK) ×2 IMPLANT
TUBING LAP HI FLOW INSUFFLATIO (TUBING) ×2 IMPLANT
UNDERPAD 30X36 HEAVY ABSORB (UNDERPADS AND DIAPERS) ×2 IMPLANT
WATER STERILE IRR 1000ML POUR (IV SOLUTION) ×4 IMPLANT

## 2023-07-29 NOTE — Progress Notes (Signed)
  Echocardiogram Echocardiogram Transesophageal has been performed.  Donna Guerrero 07/29/2023, 10:14 AM

## 2023-07-29 NOTE — Anesthesia Procedure Notes (Signed)
Central Venous Catheter Insertion Performed by: Val Eagle, MD, anesthesiologist Start/End10/24/2024 8:04 AM, 07/29/2023 8:22 AM Patient location: Pre-op. Preanesthetic checklist: patient identified, IV checked, site marked, risks and benefits discussed, surgical consent, monitors and equipment checked, pre-op evaluation, timeout performed and anesthesia consent Position: supine Lidocaine 1% used for infiltration and patient sedated Hand hygiene performed  and maximum sterile barriers used  Catheter size: 8.5 Fr Sheath introducer Procedure performed using ultrasound guided technique. Ultrasound Notes:anatomy identified, needle tip was noted to be adjacent to the nerve/plexus identified, no ultrasound evidence of intravascular and/or intraneural injection and image(s) printed for medical record Attempts: 1 Following insertion, line sutured, dressing applied and Biopatch. Post procedure assessment: blood return through all ports, free fluid flow and no air  Patient tolerated the procedure well with no immediate complications.

## 2023-07-29 NOTE — Progress Notes (Signed)
Extubation Procedure Note  Patient Details:   Name: Donna Guerrero DOB: Mar 17, 1941 MRN: [id number]   Airway Documentation:    Vent end date: (not recorded) Vent end time: (not recorded)   Evaluation  O2 sats: stable throughout Complications: No apparent complications Patient did tolerate procedure well. Bilateral Breath Sounds: Diminished   Pt passed rapid wean assessment, pt VT 1.2L and NIF -25 followed commands lifted head, abg was processed Md Lightfoot was called and Md ordered extubation pt was extubated to 4L [place] sats are stable, IS was done pt reached 600 RN aware.     Donna Guerrero 07/29/2023, 8:36 PM

## 2023-07-29 NOTE — Anesthesia Procedure Notes (Addendum)
Procedure Name: Intubation Date/Time: 07/29/2023 9:00 AM  Performed by: Flonnie Hailstone, CRNAPre-anesthesia Checklist: Patient identified, Emergency Drugs available, Suction available and Patient being monitored Patient Re-evaluated:Patient Re-evaluated prior to induction Oxygen Delivery Method: Circle System Utilized Preoxygenation: Pre-oxygenation with 100% oxygen Induction Type: IV induction Ventilation: Mask ventilation without difficulty Laryngoscope Size: Mac and 3 Grade View: Grade I Tube type: Oral Tube size: 7.0 mm Number of attempts: 1 Airway Equipment and Method: Stylet Placement Confirmation: ETT inserted through vocal cords under direct vision, positive ETCO2 and breath sounds checked- equal and bilateral Secured at: 21 cm Tube secured with: Tape Dental Injury: Teeth and Oropharynx as per pre-operative assessment

## 2023-07-29 NOTE — Brief Op Note (Signed)
07/29/2023  11:53 AM  PATIENT:  Donna Guerrero  82 y.o. female  PRE-OPERATIVE DIAGNOSIS:  CORONARY ARTERY DISEASE  POST-OPERATIVE DIAGNOSIS:  CORONARY ARTERY DISEASE  PROCEDURE:  Procedure(s):  CORONARY ARTERY BYPASS GRAFTING X 3  -LIMA to LAD -SVG to PDA -SVG to OM  TRANSESOPHAGEAL ECHOCARDIOGRAM (N/A)  ENDOSCOPIC HARVEST GREATER SAPHENOUS VEIN -Right Leg Vein harvest time: 20 min Vein prep time: 10 min  SURGEON:  Surgeons and Role:    * Corliss Skains, MD - Primary  PHYSICIAN ASSISTANT: Lowella Dandy PA-C  ASSISTANTS: Tanda Rockers RNFA, Frutoso Schatz RNFA   ANESTHESIA:   general  EBL:  350 mL   BLOOD ADMINISTERED:1 unit PRBC and CELLSAVER  DRAINS:  Left Pleural Chest Tube, Mediastinal Chest Drain    LOCAL MEDICATIONS USED:  NONE  SPECIMEN:  No Specimen  DISPOSITION OF SPECIMEN:  N/A  COUNTS:  YES  TOURNIQUET:  * No tourniquets in log *  DICTATION: .Dragon Dictation  PLAN OF CARE: Admit to inpatient   PATIENT DISPOSITION:  ICU - intubated and hemodynamically stable.   Delay start of Pharmacological VTE agent (>24hrs) due to surgical blood loss or risk of bleeding: yes

## 2023-07-29 NOTE — Op Note (Signed)
[address]       Donna Guerrero [address]             [phone]                                          07/29/2023 Patient:  Donna Guerrero Pre-Op Dx: CAD HTN   HLP Post-op Dx:  same Procedure: CABG X 3.  LIMA LAD, RSVG PDA, OM   Endoscopic greater saphenous vein harvest on the right   Surgeon and Role:      * Markeda Narvaez, Eliezer Lofts, MD - Primary    * B. Stehler , PA-C - assisting An experienced assistant was required given the complexity of this surgery and the standard of surgical care. The assistant was needed for exposure, dissection, suctioning, retraction of delicate tissues and sutures, instrument exchange and for overall help during this procedure.    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  Echocardiogram shows preserved biventricular function and no significant valvular disease.  The risks and benefits of CABG were discussed in detail.  The patient is agreeable to proceed.   Findings: Good LIMA and vein.  Good LAD, PDA, and OM  Operative Technique: All invasive lines were placed in pre-op holding.  After the risks, benefits and alternatives were thoroughly discussed, the patient was brought to the operative theatre.  Anesthesia was induced, and the patient was prepped and draped in normal sterile fashion.  An appropriate surgical pause was performed, and pre-operative antibiotics were dosed accordingly.  We began with simultaneous incisions along the right leg for harvesting of the greater saphenous vein and the chest for the sternotomy.  In regards to the sternotomy, this was carried down with bovie cautery, and the sternum was divided with a reciprocating saw.  Meticulous hemostasis was obtained.  The left internal thoracic artery was exposed and harvested in  in pedicled fashion.  The patient was systemically heparinized, and the artery was divided distally, and placed in a papaverine sponge.    The sternal elevator was removed, and a retractor was placed.  The pericardium was divided in the midline and fashioned into a cradle with pericardial stitches.   After we confirmed an appropriate ACT, the ascending aorta was cannulated in standard fashion.  The right atrial appendage was used for venous cannulation site.  Cardiopulmonary bypass was initiated, and the heart retractor was placed. The cross clamp was applied, and a dose of anterograde cardioplegia was given with good arrest of the heart.  We moved to the posterior wall of the heart, and found a good target on the PDA.  An arteriotomy was made, and the vein graft was anastomosed to it in an end to side fashion.  Next we exposed the lateral wall, and found a good target on the OM.  An end to side anastomosis with the vein graft was then created.  Finally, we exposed a good target on the LAD, and fashioned an end to side anastomosis between it and the LITA.  We began to re-warm, and a re-animation dose of cardioplegia was given.  The heart was de-aired, and the cross clamp was removed.  Meticulous hemostasis was obtained.    A partial occludding clamp was then placed on the ascending aorta, and we created an end to side anastomosis between it and the proximal vein grafts.  Rings were placed on the proximal anastomosis.  Hemostasis was obtained, and we separated from cardiopulmonary bypass without event.  The heparin was reversed with protamine.  Chest tubes and wires were placed, and the sternum was re-approximated with sternal wires.  The soft tissue and skin were re-approximated wth absorbable suture.    The patient tolerated the procedure without any immediate complications, and was transferred to the ICU in guarded condition.  Donna Guerrero

## 2023-07-29 NOTE — Interval H&P Note (Signed)
History and Physical Interval Note:  07/29/2023 8:14 AM  Donna Guerrero  has presented today for surgery, with the diagnosis of CORONARY ARTERY DISEASE.  The various methods of treatment have been discussed with the patient and family. After consideration of risks, benefits and other options for treatment, the patient has consented to  Procedure(s): CORONARY ARTERY BYPASS GRAFTING (CABG) (N/A) TRANSESOPHAGEAL ECHOCARDIOGRAM (N/A) as a surgical intervention.  The patient's history has been reviewed, patient examined, no change in status, stable for surgery.  I have reviewed the patient's chart and labs.  Questions were answered to the patient's satisfaction.     Brecklyn Galvis Keane Scrape

## 2023-07-29 NOTE — Anesthesia Preprocedure Evaluation (Signed)
Anesthesia Evaluation  Patient identified by MRN, date of birth, ID band Patient awake    Reviewed: Allergy & Precautions, NPO status , Patient's Chart, lab work & pertinent test results  History of Anesthesia Complications Negative for: history of anesthetic complications  Airway Mallampati: II  TM Distance: >3 FB Neck ROM: Full    Dental  (+) Teeth Intact, Dental Advisory Given   Pulmonary neg pulmonary ROS   breath sounds clear to auscultation       Cardiovascular hypertension, Pt. on medications + angina  + CAD   Rhythm:Regular   1. Left ventricular ejection fraction, by estimation, is 55 to 60%. The  left ventricle has normal function. The left ventricle has no regional  wall motion abnormalities. Left ventricular diastolic parameters are  consistent with Grade I diastolic  dysfunction (impaired relaxation).   2. Right ventricular systolic function is normal. The right ventricular  size is normal. Tricuspid regurgitation signal is inadequate for assessing  PA pressure.   3. The mitral valve is normal in structure. Trivial mitral valve  regurgitation. No evidence of mitral stenosis.   4. The aortic valve is tricuspid. There is moderate calcification of the  aortic valve. Aortic valve regurgitation is trivial. Aortic valve  sclerosis/calcification is present, without any evidence of aortic  stenosis. Aortic valve mean gradient measures  9.0 mmHg.   5. Aortic dilatation noted. There is mild dilatation of the ascending  aorta, measuring 39 mm.   6. The inferior vena cava is normal in size with greater than 50%  respiratory variability, suggesting right atrial pressure of 3 mmHg.     Neuro/Psych negative neurological ROS  negative psych ROS   GI/Hepatic negative GI ROS, Neg liver ROS,,,  Endo/Other  diabetesHypothyroidism    Renal/GU      Musculoskeletal  (+) Arthritis ,    Abdominal   Peds   Hematology negative hematology ROS (+)   Anesthesia Other Findings   Reproductive/Obstetrics                              Anesthesia Physical Anesthesia Plan  ASA: 4  Anesthesia Plan: General   Post-op Pain Management:    Induction: Intravenous  PONV Risk Score and Plan: 4 or greater and Ondansetron  Airway Management Planned: Oral ETT  Additional Equipment: Arterial line, CVP, TEE and Ultrasound Guidance Line Placement  Intra-op Plan:   Post-operative Plan: Post-operative intubation/ventilation  Informed Consent: I have reviewed the patients History and Physical, chart, labs and discussed the procedure including the risks, benefits and alternatives for the proposed anesthesia with the patient or authorized representative who has indicated his/her understanding and acceptance.     Dental advisory given  Plan Discussed with: CRNA  Anesthesia Plan Comments:          Anesthesia Quick Evaluation

## 2023-07-29 NOTE — Discharge Summary (Signed)
[address]       [place] [address]             [phone]    Physician Discharge Summary  Patient ID: LITSA CURE MRN: [id number] DOB/AGE: [birth year] 82 y.o.  Admit date: 07/29/2023 Discharge date: 08/03/2023  Admission Diagnoses:  Patient Active Problem List   Diagnosis Date Noted   Coronary artery disease of native artery of native heart with stable angina pectoris (HCC) 07/05/2023   Essential hypertension, benign 10/03/2013   Mixed hyperlipidemia 10/03/2013   Hypothyroidism 10/03/2013   Type II or unspecified type diabetes mellitus without mention of complication, not stated as uncontrolled 10/03/2013   Discharge Diagnoses:  Patient Active Problem List   Diagnosis Date Noted   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   Coronary artery disease of native artery of native heart with stable angina pectoris (HCC) 07/05/2023   Essential hypertension, benign 10/03/2013   Mixed hyperlipidemia 10/03/2013   Hypothyroidism 10/03/2013   Type II or unspecified type diabetes mellitus without mention of complication, not stated as uncontrolled 10/03/2013   Discharged Condition: stable  History of Present Illness:  Donna Guerrero is an 82 yo female with history of Essential HTN, HLD, Hypothyroidism, Type DM, and CAD.  The patient has been experiencing chest discomfort with exertion particularly walking uphill.  She was evaluated by Cardiology who recommended further workup with catheterization.  This showed severe 3V CAD that was felt to best be treated with coronary bypass grafting.  She was referred to [facility] and evaluated by Dr. Cliffton Asters who was in agreement the patient would benefit from bypass grafting.  The risks and benefits of the procedure were explained to the patient and she was agreeable to proceed.  Hospital Course:  Shenequa presented to [facility] on 07/29/2023.  She was taken to the operating room and underwent CABG  x 3 utilizing LIMA to LAD, SVG to OM, and SVG to PDA. She transported from the OR to [facility] ICU in stable condition. A line, chest tubes, and foley were removed early in her post operative course. She was transitioned off the Insulin drip. Her pre op HGA1C was 6.9.  Of note, she has not been on medication for her diabetes. A close outpatient PCP appointment was arranged for further diabetes management.  She was volume overloaded and diuresed accordingly. She had nausea post op and was given scheduled Reglan and Zofran PRN. She went into a fib and was put on Amiodarone drip. Her nausea resolved. Creatinine increased to 1.28, likely from diuresis and Toradol. Diuretic held and creatinine decreased. Her epicardial pacing wires were removed without complication. She was felt stable for transfer to the progressive unit but there was no bed available until 10/28. She remained volume overload, gentle diuresis was restarted. She was hypertensive and had a 30 minute burst of controlled rate atrial fibrillation and spontaneously converted back to NSR. Her Lopressor was titrated to 25mg  BID. As discussed with Dr. Cliffton Asters DOAC was not started due to her age. Her bowels began moving. She was ambulating well on room air. Her incisions were healing well without sign of infection. The patient has all necessary DME at home her daughter would be staying with her. She was felt stable for discharge home.   Consults: pulmonary/intensive care  Significant Diagnostic Studies: angiography:     Ost LAD lesion is 85% stenosed.   Prox LAD lesion is 70% stenosed.   1st  Diag lesion is 60% stenosed.   Mid LAD lesion is 50% stenosed.   1st Mrg lesion is 70% stenosed.   RPAV-1 lesion is 95% stenosed.   RPAV-2 lesion is 80% stenosed.   RPDA lesion is 70% stenosed.   The left ventricular systolic function is normal.   LV end diastolic pressure is normal.   The left ventricular ejection fraction is 55-65% by visual estimate.   Severe  3 vessel obstructive CAD Normal LV function Normal LVEDP   Plan: her disease is not amenable to PCI. Would consider CABG given high risk anatomy.   Treatments: surgery:  CABG X 3.  LIMA LAD, RSVG PDA, OM   Endoscopic greater saphenous vein harvest on the right by Dr. Cliffton Asters on 07/29/2023.  Discharge Exam: Blood pressure 129/83, pulse 76, temperature 98.1 F (36.7 C), temperature source Oral, resp. rate 20, height 5' (1.524 m), weight 63.8 kg, SpO2 99%. General appearance: alert, cooperative, and no distress Neurologic: intact Heart: regular rate and rhythm, S1, S2 normal, no murmur, click, rub or gallop Lungs: minimally diminished bibasilar breath sounds Abdomen: soft, non-tender; bowel sounds normal; no masses,  no organomegaly Extremities: edema 1+ Wound: Clean and dry, no erythema or sign of infection  Discharge Medications:  The patient has been discharged on:   1.Beta Blocker:  Yes [  X]                              No   [   ]                              If No, reason:  2.Ace Inhibitor/ARB: Yes [   ]                                     No  [  X  ]                                     If No, reason: Soft BP, AKI  3.Statin:   Yes [  X ]                  No  [   ]                  If No, reason:  4.Ecasa:  Yes  [ X ]                  No   [   ]                  If No, reason:  Patient had ACS upon admission: No  Plavix/P2Y12 inhibitor: Yes [   ]                                      No  [ X ]     Discharge Instructions     Amb Referral to Cardiac Rehabilitation   Complete by: As directed    Diagnosis: CABG   CABG X ___: 3   After initial evaluation and assessments completed: Virtual Based Care may be provided alone or  in conjunction with Phase 2 Cardiac Rehab based on patient barriers.: Yes   Intensive Cardiac Rehabilitation (ICR) [facility] location only OR Traditional Cardiac Rehabilitation (TCR) *If criteria for ICR are not met will enroll in TCR [facility] only):  Yes      Allergies as of 08/03/2023       Reactions   Welchol [colesevelam Hcl] Other (See Comments)   Tablets were too big, CAUSED FLU-LIKE SYMPTOMS   Amaryl [glimepiride] Other (See Comments)   headache   Atorvastatin    MYALGIAS   Codeine Nausea Only   Crestor [rosuvastatin]    Myalgias   Empagliflozin Other (See Comments)   Ezetimibe    Other Reaction(s): indigestion   Metformin Hcl Other (See Comments)   Unknown reaction   Simvastatin Nausea Only        Medication List     STOP taking these medications    amLODipine 2.5 MG tablet Commonly known as: NORVASC   chlorthalidone 25 MG tablet Commonly known as: HYGROTON   lisinopril 40 MG tablet Commonly known as: ZESTRIL   propranolol 40 MG tablet Commonly known as: INDERAL       TAKE these medications    acetaminophen 500 MG tablet Commonly known as: TYLENOL Take 500 mg by mouth every 6 (six) hours as needed for moderate pain.   amiodarone 200 MG tablet Commonly known as: PACERONE Take 1 tablet twice per day for 5 days, then take 1 tablet daily thereafter   aspirin EC 325 MG tablet Take 1 tablet (325 mg total) by mouth daily.   dorzolamide-timolol 2-0.5 % ophthalmic solution Commonly known as: COSOPT Place 1 drop into both eyes 2 (two) times daily.   furosemide 40 MG tablet Commonly known as: LASIX Take 1 tablet (40 mg total) by mouth daily. Take for 7 days then stop   glucose blood test strip 1 each by Other route as needed for other. Use as instructed   levothyroxine 75 MCG tablet Commonly known as: SYNTHROID Take 75 mcg by mouth daily before breakfast.   Livalo 1 MG Tabs Generic drug: Pitavastatin Calcium Take 1 mg by mouth every Monday, Wednesday, and Friday. Notes to patient: Take as you were prior to admission    metoprolol tartrate 25 MG tablet Commonly known as: LOPRESSOR Take 1 tablet (25 mg total) by mouth 2 (two) times daily.   oxyCODONE 5 MG immediate release  tablet Commonly known as: Oxy IR/ROXICODONE Take 1 tablet (5 mg total) by mouth every 6 (six) hours as needed for severe pain (pain score 7-10).   potassium chloride SA 20 MEQ tablet Commonly known as: KLOR-CON M Take 1 tablet (20 mEq total) by mouth daily.   Vyzulta 0.024 % Soln Generic drug: Latanoprostene Bunod Place 1 drop into both eyes at bedtime.        Follow-up Information     Lightfoot, Eliezer Lofts, MD Follow up.   Specialty: Cardiothoracic Surgery Why: Virtual appointment is at 3:00PM. Please do NOT go to the office. Dr. Cliffton Asters will call you on 08/13/2023 at 3:00 pm. Contact information: [address] [place] [address] [phone]         Perlie Gold, PA-C Follow up on 08/24/2023.   Specialty: Cardiology Why: Cardiology appointment is at 8:25AM Contact information: [address] [place] [address] [phone]         Georgann Housekeeper, MD Follow up on 08/09/2023.   Specialty: Internal Medicine Why: Hospital follow up and for  further diabetes follow up, preop A1C 6.9. Appointment is at 2:30PM. Contact information: [address] [place] [address] [phone]                 Signed:  Jenny Reichmann, PA-C 08/03/2023, 4:17 PM

## 2023-07-29 NOTE — Discharge Instructions (Signed)

## 2023-07-29 NOTE — Consult Note (Signed)
NAME:  Donna Guerrero, MRN:  [id number], DOB:  08-12-1941, LOS: 0 ADMISSION DATE:  07/29/2023, CONSULTATION DATE:  10/24 REFERRING MD:  Cliffton Asters, CHIEF COMPLAINT:  post op critical care    History of Present Illness:  82 year old female with history as outlined with known three-vessel coronary artery disease and progressive exertional dyspnea was referred to cardiothoracic surgery and now status post CABG x 3 vessels, LIMA LAD, RSVG PDA, OM with endoscopic harvest of right saphenous vein. Bypass initiated at 1058 and discontinued at 1208 EBL 350 mL Returned to the intensive care on full ventilatory support slightly hypothermic not requiring vasoactive drips Pertinent  Medical History  Type 2 diabetes, essential tremor, glaucoma, hyperlipidemia but intolerant of statins, hypertension, hypothyroidism, rosacea, remote whooping cough  Significant Hospital Events: Including procedures, antibiotic start and stop dates in addition to other pertinent events   10/24 three-vessel CABG [place]  Interim History / Subjective:  Sedated on Precedex Postoperative labs reviewed arterial blood gas,Hemoglobin, and i-STAT.  All fairly expected however was hypokalemic Objective   Blood pressure (!) 152/71, pulse 61, temperature 98.4 F (36.9 C), temperature source Oral, resp. rate 12, height 5' (1.524 m), weight 60.3 kg, SpO2 96%.    Vent Mode: SIMV;PRVC;PSV FiO2 (%):  [40 %-50 %] 50 % Set Rate:  [12 bmp-16 bmp] 16 bmp Vt Set:  [360 mL] 360 mL PEEP:  [5 cmH20] 5 cmH20   Intake/Output Summary (Last 24 hours) at 07/29/2023 1415 Last data filed at 07/29/2023 1326 Gross per 24 hour  Intake 3325.22 ml  Output 2250 ml  Net 1075.22 ml   Filed Weights   07/29/23 0645  Weight: 60.3 kg    Examination: General: Well-nourished 82 year old female patient currently sedated postcardiac surgery HENT: Orally intubated normal cephalic mucous membranes moist no JVD Lungs: Clear to auscultation with equal  chest rise.  Right chest tube has put out 100 mL of bloody output from time of presentation to the ICU to time of my exam .Portable chest x-ray showing endotracheal tube in satisfactory position.  Mediastinal tube is in place post CABG changes.  Decrease volume left base.  Right IJ line on remarkable Cardiovascular: Regular rate and rhythm, sternal dressing clean dry and intact Abdomen: Soft not tender Extremities: Warm and dry Neuro: Sedated on Precedex GU: Clear yellow urine  Resolved Hospital Problem list     Assessment & Plan:  Three-vessel coronary artery disease now status post coronary artery bypass grafting x 3 with endoscopic harvest of right saphenous vein Plan Continue telemetry monitoring Continue monitor hemodynamic indices Norepinephrine on standby for hypotension, Cleviprex on standby for systolic blood pressure sustained above 130 Aspirin per surgical team Complete prophylactic Ancef and vanc  Glycemic control with glucose 110-140 Close observation of CT output Low-dose beta-blockade Pravachol per primary  Follow-up postoperative labs Multimodal pain management  Ventilator management Arterial blood gas reviewed  plan Continue full ventilator support with rapid weaning protocol VAP prevention Assess for readiness for weaning and extubation as hypothermia resolves  Hypokalemia Plan Replace and recheck  Postoperative anemia (expected) EBL recorded at 350 -Baseline hemoglobin 12.4, now at 10.2 Plan Watch for evidence of bleeding Transfusion for active bleeding  Type 2 diabetes Plan Glycemic control as mentioned above  Hypothyroidism Plan Resume replacement  Glaucoma Plan Continue eyedrops  Best Practice (right click and "Reselect all SmartList Selections" daily)   Diet/type: NPO DVT prophylaxis: SCD GI prophylaxis: PPI Lines: Central line Foley:  Yes, and it is still needed Code Status:  full  code Last date of multidisciplinary goals of care  discussion [per primary ]  Labs   CBC: Recent Labs  Lab 07/28/23 0936 07/29/23 0906 07/29/23 1140 07/29/23 1157 07/29/23 1225 07/29/23 1228 07/29/23 1337 07/29/23 1338  WBC 5.9  --   --   --   --   --  11.8*  --   HGB 12.4   < > 8.7* 8.8* 10.5* 10.2* 10.2* 9.9*  HCT 38.5   < > 26.5* 26.0* 31.0* 30.0* 30.9* 29.0*  MCV 92.5  --   --   --   --   --  89.6  --   PLT 201  --  118*  --   --   --  104*  --    < > = values in this interval not displayed.    Basic Metabolic Panel: Recent Labs  Lab 07/28/23 0936 07/29/23 0906 07/29/23 1042 07/29/23 1103 07/29/23 1108 07/29/23 1127 07/29/23 1157 07/29/23 1225 07/29/23 1228 07/29/23 1338  NA 136   < > 138   < > 130* 134* 136 138 138 142  K 3.5   < > 2.8*   < > 3.2* 3.2* 4.2 3.0* 3.0* 2.7*  CL 104   < > 102  --  96* 94* 98  --  105  --   CO2 21*  --   --   --   --   --   --   --   --   --   GLUCOSE 178*   < > 173*  --  122* 134* 162*  --  146*  --   BUN 25*   < > 18  --  13 16 17   --  15  --   CREATININE 0.83   < > 0.50  --  0.30* 0.50 0.50  --  0.40*  --   CALCIUM 9.4  --   --   --   --   --   --   --   --   --    < > = values in this interval not displayed.   GFR: Estimated Creatinine Clearance: 44.8 mL/min (A) (by C-G formula based on SCr of 0.4 mg/dL (L)). Recent Labs  Lab 07/28/23 0936 07/29/23 1337  WBC 5.9 11.8*    Liver Function Tests: Recent Labs  Lab 07/28/23 0936  AST 21  ALT 20  ALKPHOS 63  BILITOT 1.0  PROT 7.4  ALBUMIN 3.9   No results for input(s): "LIPASE", "AMYLASE" in the last 168 hours. No results for input(s): "AMMONIA" in the last 168 hours.  ABG    Component Value Date/Time   PHART 7.387 07/29/2023 1338   PCO2ART 35.2 07/29/2023 1338   PO2ART 71 (L) 07/29/2023 1338   HCO3 21.7 07/29/2023 1338   TCO2 23 07/29/2023 1338   ACIDBASEDEF 4.0 (H) 07/29/2023 1338   O2SAT 96 07/29/2023 1338     Coagulation Profile: Recent Labs  Lab 07/28/23 0936 07/29/23 1337  INR 1.0 1.5*     Cardiac Enzymes: No results for input(s): "CKTOTAL", "CKMB", "CKMBINDEX", "TROPONINI" in the last 168 hours.  HbA1C: Hgb A1c MFr Bld  Date/Time Value Ref Range Status  07/28/2023 09:36 AM 6.9 (H) 4.8 - 5.6 % Final    Comment:    (NOTE) Pre diabetes:          5.7%-6.4%  Diabetes:              >6.4%  Glycemic control for   <7.0% adults  with diabetes     CBG: Recent Labs  Lab 07/28/23 0842 07/29/23 0648  GLUCAP 174* 146*    Review of Systems:   Not able  Past Medical History:  She,  has a past medical history of Allergic rhinitis, Arthritis, Coronary artery disease, Diabetes mellitus without complication (HCC), Essential tremor, Glaucoma (11/2008), Hyperlipidemia, Hypertension, Hypothyroidism, Rosacea, and Whooping cough.   Surgical History:   Past Surgical History:  Procedure Laterality Date   CATARACT EXTRACTION Bilateral    EYE SURGERY     Flap added to facilitate fuild removal   LEFT HEART CATH AND CORONARY ANGIOGRAPHY N/A 07/07/2023   Procedure: LEFT HEART CATH AND CORONARY ANGIOGRAPHY;  Surgeon: [place], Latonda Larrivee M, MD;  Location: [facility] INVASIVE CV LAB;  Service: Cardiovascular;  Laterality: N/A;   RETINAL DETACHMENT SURGERY     [facility]   RETINAL DETACHMENT SURGERY     [facility]     Social History:   reports that she has never smoked. She has never used smokeless tobacco. She reports current alcohol use of about 2.0 standard drinks of alcohol per week. She reports that she does not use drugs.   Family History:  Her family history includes CAD in her father; COPD in her brother; Heart attack in her father; Heart disease in her mother. There is no history of Hypertension or Stroke.   Allergies Allergies  Allergen Reactions   Welchol [Colesevelam Hcl] Other (See Comments)    Tablets were too big, CAUSED FLU-LIKE SYMPTOMS   Amaryl [Glimepiride] Other (See Comments)    headache   Atorvastatin     MYALGIAS   Codeine Nausea Only   Crestor  [Rosuvastatin]     Myalgias   Empagliflozin Other (See Comments)   Ezetimibe     Other Reaction(s): indigestion   Metformin Hcl Other (See Comments)    Unknown reaction   Simvastatin Nausea Only     Home Medications  Prior to Admission medications   Medication Sig Start Date End Date Taking? Authorizing Provider  acetaminophen (TYLENOL) 500 MG tablet Take 500 mg by mouth every 6 (six) hours as needed for moderate pain.   Yes [provider]  amLODipine (NORVASC) 2.5 MG tablet Take 1.25 mg by mouth at bedtime. 04/18/20  Yes [provider]  chlorthalidone (HYGROTON) 25 MG tablet Take 1 tablet (25 mg total) by mouth daily. Please make overdue appt with Dr. Eldridge Dace before anymore refills. 1st attempt 06/21/19  Yes Dunn, Dayna N, PA-C  dorzolamide-timolol (COSOPT) 2-0.5 % ophthalmic solution Place 1 drop into both eyes 2 (two) times daily. 05/31/23  Yes [provider]  levothyroxine (SYNTHROID) 75 MCG tablet Take 75 mcg by mouth daily before breakfast. 04/18/20  Yes [provider]  lisinopril (PRINIVIL,ZESTRIL) 40 MG tablet Take 1 tablet (40 mg total) by mouth daily. 08/31/17  Yes Corky Crafts, MD  LIVALO 1 MG TABS Take 1 mg by mouth every Monday, Wednesday, and Friday. 04/19/23  Yes [provider]  potassium chloride SA (K-DUR,KLOR-CON) 20 MEQ tablet Take 1 tablet (20 mEq total) by mouth daily. 05/02/18  Yes Corky Crafts, MD  propranolol (INDERAL) 40 MG tablet Take 1 tablet (40 mg total) by mouth every evening. 07/14/23  Yes Chandrasekhar, Mahesh A, MD  VYZULTA 0.024 % SOLN Place 1 drop into both eyes at bedtime. 04/19/20  Yes [provider]  glucose blood test strip 1 each by Other route as needed for other. Use as instructed    [provider]     Critical care time: 32 min     Simonne Martinet ACNP-BC [facility]monary/Critical Care Pager # [phone] OR # [phone] if no answer

## 2023-07-29 NOTE — Progress Notes (Signed)
   07/29/23 1920  Vent Select  Invasive or Noninvasive Invasive  Adult Vent Y  Airway 7 mm  Placement Date/Time: 07/29/23 (c) 0900   Grade View: Grade 1  Airway Device: Endotracheal Tube  Laryngoscope Blade: MAC;3  ETT Types: Oral  Size (mm): 7 mm  Cuffed: Min.occ.pres.  Insertion attempts: 1  Airway Equipment: Stylet  Placement Confirmation...  Secured at (cm) 21 cm  Measured From [employer]  Secured By Commercial Tube Holder  Tube Holder Repositioned Yes  Prone position No  Site Condition Dry  Adult Ventilator Settings  Vent Type Servo i  Humidity HME  Vent Mode SIMV;PRVC;PSV  Set Rate 4 bmp  FiO2 (%) 40 %  Adult Ventilator Measurements  SpO2 100 %   Placed pt on rapid wean

## 2023-07-29 NOTE — Anesthesia Procedure Notes (Signed)
Arterial Line Insertion Start/End10/24/2024 7:45 AM, 07/29/2023 8:00 AM Performed by: Val Eagle, MD, Flonnie Hailstone, CRNA, CRNA  Patient location: Pre-op. Preanesthetic checklist: patient identified, IV checked, site marked, risks and benefits discussed, surgical consent, monitors and equipment checked, pre-op evaluation, timeout performed and anesthesia consent Lidocaine 1% used for infiltration radial was placed Catheter size: 20 G Hand hygiene performed  and maximum sterile barriers used   Attempts: 1 Procedure performed using ultrasound guided technique. Ultrasound Notes:anatomy identified, needle tip was noted to be adjacent to the nerve/plexus identified and no ultrasound evidence of intravascular and/or intraneural injection Following insertion, dressing applied and Biopatch. Post procedure assessment: normal and unchanged  Patient tolerated the procedure well with no immediate complications.

## 2023-07-29 NOTE — Transfer of Care (Signed)
Immediate Anesthesia Transfer of Care Note  Patient: Donna Guerrero  Procedure(s) Performed: CORONARY ARTERY BYPASS GRAFTING X 3 USING LEFT INTERNAL MAMMARY ARTERY AND RIGHT GREAT SAPHENOUS VEIN, HARVESTED ENDOSCOPICALLY (Chest) TRANSESOPHAGEAL ECHOCARDIOGRAM  Patient Location: ICU  Anesthesia Type:General  Level of Consciousness: sedated and Patient remains intubated per anesthesia plan  Airway & Oxygen Therapy: Patient remains intubated per anesthesia plan and Patient placed on Ventilator (see vital sign flow sheet for setting)  Post-op Assessment: Report given to RN and Post -op Vital signs reviewed and stable  Post vital signs: Reviewed and stable  Last Vitals:  Vitals Value Taken Time  BP 92/56 07/29/23 1320  Temp 34.5 C 07/29/23 1340  Pulse 65 07/29/23 1340  Resp 14 07/29/23 1340  SpO2 98 % 07/29/23 1340  Vitals shown include unfiled device data.  Last Pain:  Vitals:   07/29/23 0730  TempSrc:   PainSc: 0-No pain         Complications: There were no known notable events for this encounter.

## 2023-07-29 NOTE — Hospital Course (Addendum)
History of Present Illness:  Shantise Beauchesne is an 82 yo female with history of Essential HTN, HLD, Hypothyroidism, Type DM, and CAD.  The patient has been experiencing chest discomfort with exertion particularly walking uphill.  She was evaluated by Cardiology who recommended further workup with catheterization.  This showed severe 3V CAD that was felt to best be treated with coronary bypass grafting.  She was referred to [facility] and evaluated by Dr. Cliffton Asters who was in agreement the patient would benefit from bypass grafting.  The risks and benefits of the procedure were explained to the patient and she was agreeable to proceed.  Hospital Course:  Kameron presented to [facility] on 07/29/2023.  She was taken to the operating room and underwent CABG x 3 utilizing LIMA to LAD, SVG to OM, and SVG to PDA. She transported from the OR to [facility] ICU in stable condition. A line, chest tubes, and foley were removed early in her post operative course. She was transitioned off the Insulin drip. Her pre op HGA1C was 6.9.  Of note, she has not been on medication for her diabetes. She was volume overloaded and diuresed accordingly. She had nausea post op and was given scheduled Reglan and Zofran PRN.

## 2023-07-29 NOTE — Anesthesia Procedure Notes (Signed)
Central Venous Catheter Insertion Performed by: Val Eagle, MD, anesthesiologist Start/End10/24/2024 8:04 AM, 07/29/2023 8:24 AM Patient location: Pre-op. Preanesthetic checklist: patient identified, IV checked, site marked, risks and benefits discussed, surgical consent, monitors and equipment checked, pre-op evaluation, timeout performed and anesthesia consent Hand hygiene performed  and maximum sterile barriers used  PA cath was placed.Swan type:thermodilution Procedure performed without using ultrasound guided technique. Attempts: 1 Patient tolerated the procedure well with no immediate complications.

## 2023-07-30 ENCOUNTER — Encounter (HOSPITAL_COMMUNITY): Payer: Self-pay | Admitting: Thoracic Surgery (Cardiothoracic Vascular Surgery)

## 2023-07-30 ENCOUNTER — Other Ambulatory Visit (HOSPITAL_COMMUNITY): Payer: Self-pay

## 2023-07-30 ENCOUNTER — Inpatient Hospital Stay (HOSPITAL_COMMUNITY): Payer: Medicare Other

## 2023-07-30 DIAGNOSIS — Z951 Presence of aortocoronary bypass graft: Secondary | ICD-10-CM

## 2023-07-30 LAB — BASIC METABOLIC PANEL
Anion gap: 16 — ABNORMAL HIGH (ref 5–15)
Anion gap: 10 (ref 5–15)
BUN: 15 mg/dL (ref 8–23)
BUN: 16 mg/dL (ref 8–23)
CO2: 22 mmol/L (ref 22–32)
CO2: 25 mmol/L (ref 22–32)
Calcium: 8.6 mg/dL — ABNORMAL LOW (ref 8.9–10.3)
Calcium: 8.8 mg/dL — ABNORMAL LOW (ref 8.9–10.3)
Chloride: 104 mmol/L (ref 98–111)
Chloride: 101 mmol/L (ref 98–111)
Creatinine, Ser: 0.93 mg/dL (ref 0.44–1.00)
Creatinine, Ser: 1.13 mg/dL — ABNORMAL HIGH (ref 0.44–1.00)
GFR, Estimated: >60 mL/min (ref >60)
GFR, Estimated: 49 mL/min — ABNORMAL LOW (ref >60)
Glucose, Bld: 146 mg/dL — ABNORMAL HIGH (ref 70–99)
Glucose, Bld: 156 mg/dL — ABNORMAL HIGH (ref 70–99)
Potassium: 3.3 mmol/L — ABNORMAL LOW (ref 3.5–5.1)
Potassium: 3.4 mmol/L — ABNORMAL LOW (ref 3.5–5.1)
Sodium: 142 mmol/L (ref 135–145)
Sodium: 136 mmol/L (ref 135–145)

## 2023-07-30 LAB — CBC
HCT: 32 % — ABNORMAL LOW (ref 36.0–46.0)
HCT: 31.9 % — ABNORMAL LOW (ref 36.0–46.0)
Hemoglobin: 10.6 g/dL — ABNORMAL LOW (ref 12.0–15.0)
Hemoglobin: 10.3 g/dL — ABNORMAL LOW (ref 12.0–15.0)
MCH: 30 pg (ref 26.0–34.0)
MCH: 29.5 pg (ref 26.0–34.0)
MCHC: 33.1 g/dL (ref 30.0–36.0)
MCHC: 32.3 g/dL (ref 30.0–36.0)
MCV: 90.7 fL (ref 80.0–100.0)
MCV: 91.4 fL (ref 80.0–100.0)
Platelets: 135 10*3/uL — ABNORMAL LOW (ref 150–400)
Platelets: 123 10*3/uL — ABNORMAL LOW (ref 150–400)
RBC: 3.53 MIL/uL — ABNORMAL LOW (ref 3.87–5.11)
RBC: 3.49 MIL/uL — ABNORMAL LOW (ref 3.87–5.11)
RDW: 15 % (ref 11.5–15.5)
RDW: 15.1 % (ref 11.5–15.5)
WBC: 11.1 10*3/uL — ABNORMAL HIGH (ref 4.0–10.5)
WBC: 12.2 10*3/uL — ABNORMAL HIGH (ref 4.0–10.5)
nRBC: 0 % (ref 0.0–0.2)
nRBC: 0 % (ref 0.0–0.2)

## 2023-07-30 LAB — POCT I-STAT 7, (LYTES, BLD GAS, ICA,H+H)
Acid-base deficit: 2 mmol/L (ref 0.0–2.0)
Bicarbonate: 24.3 mmol/L (ref 20.0–28.0)
Calcium, Ion: 1.31 mmol/L (ref 1.15–1.40)
HCT: 32 % — ABNORMAL LOW (ref 36.0–46.0)
Hemoglobin: 10.9 g/dL — ABNORMAL LOW (ref 12.0–15.0)
O2 Saturation: 97 %
Patient temperature: 36.7
Potassium: 4.1 mmol/L (ref 3.5–5.1)
Sodium: 141 mmol/L (ref 135–145)
TCO2: 26 mmol/L (ref 22–32)
pCO2 arterial: 49.1 mm[Hg] — ABNORMAL HIGH (ref 32–48)
pH, Arterial: 7.301 — ABNORMAL LOW (ref 7.35–7.45)
pO2, Arterial: 105 mm[Hg] (ref 83–108)

## 2023-07-30 LAB — GLUCOSE, CAPILLARY
Glucose-Capillary: 112 mg/dL — ABNORMAL HIGH (ref 70–99)
Glucose-Capillary: 114 mg/dL — ABNORMAL HIGH (ref 70–99)
Glucose-Capillary: 126 mg/dL — ABNORMAL HIGH (ref 70–99)
Glucose-Capillary: 130 mg/dL — ABNORMAL HIGH (ref 70–99)
Glucose-Capillary: 133 mg/dL — ABNORMAL HIGH (ref 70–99)
Glucose-Capillary: 134 mg/dL — ABNORMAL HIGH (ref 70–99)
Glucose-Capillary: 138 mg/dL — ABNORMAL HIGH (ref 70–99)
Glucose-Capillary: 140 mg/dL — ABNORMAL HIGH (ref 70–99)
Glucose-Capillary: 148 mg/dL — ABNORMAL HIGH (ref 70–99)
Glucose-Capillary: 160 mg/dL — ABNORMAL HIGH (ref 70–99)

## 2023-07-30 LAB — MAGNESIUM
Magnesium: 2.3 mg/dL (ref 1.7–2.4)
Magnesium: 2 mg/dL (ref 1.7–2.4)

## 2023-07-30 MED ORDER — TRAMADOL HCL 50 MG PO TABS: 50.0000 mg | ORAL_TABLET | ORAL | Status: DC | PRN

## 2023-07-30 MED ORDER — KETOROLAC TROMETHAMINE 15 MG/ML IJ SOLN
7.5000 mg | Freq: Four times a day (QID) | INTRAMUSCULAR | Status: DC | PRN
  Administered 2023-07-30 – 2023-07-31 (×2): 7.5 mg via INTRAVENOUS
  Filled 2023-07-30 (×2): qty 1

## 2023-07-30 MED ORDER — AMIODARONE HCL IN DEXTROSE 360-4.14 MG/200ML-% IV SOLN
30.0000 mg/h | INTRAVENOUS | Status: DC
Start: 2023-07-30 — End: 2023-07-31
  Administered 2023-07-30 – 2023-07-31 (×3): 30 mg/h via INTRAVENOUS
  Filled 2023-07-30 (×2): qty 200

## 2023-07-30 MED ORDER — POTASSIUM CHLORIDE 10 MEQ/50ML IV SOLN
10.0000 meq | INTRAVENOUS | Status: AC
  Administered 2023-07-30 (×3): 10 meq via INTRAVENOUS
  Filled 2023-07-30 (×3): qty 50

## 2023-07-30 MED ORDER — PROMETHAZINE (PHENERGAN) 6.25MG IN NS 50ML IVPB
6.2500 mg | Freq: Four times a day (QID) | INTRAVENOUS | Status: DC | PRN
  Filled 2023-07-30 (×2): qty 50

## 2023-07-30 MED ORDER — AMIODARONE HCL IN DEXTROSE 360-4.14 MG/200ML-% IV SOLN
60.0000 mg/h | INTRAVENOUS | Status: AC
  Administered 2023-07-30: 60 mg/h via INTRAVENOUS
  Filled 2023-07-30: qty 400

## 2023-07-30 MED ORDER — INSULIN ASPART 100 UNIT/ML IJ SOLN
0.0000 [IU] | INTRAMUSCULAR | Status: DC
  Administered 2023-07-30 (×4): 2 [IU] via SUBCUTANEOUS
  Administered 2023-07-31: 8 [IU] via SUBCUTANEOUS

## 2023-07-30 MED ORDER — ENOXAPARIN SODIUM 40 MG/0.4ML IJ SOSY
40.0000 mg | PREFILLED_SYRINGE | Freq: Every day | INTRAMUSCULAR | Status: DC
  Administered 2023-07-30 – 2023-08-02 (×4): 40 mg via SUBCUTANEOUS
  Filled 2023-07-30 (×4): qty 0.4

## 2023-07-30 MED ORDER — FUROSEMIDE 10 MG/ML IJ SOLN
40.0000 mg | Freq: Two times a day (BID) | INTRAMUSCULAR | Status: AC
  Administered 2023-07-30 (×2): 40 mg via INTRAVENOUS
  Filled 2023-07-30 (×2): qty 4

## 2023-07-30 MED ORDER — POTASSIUM CHLORIDE 10 MEQ/50ML IV SOLN
10.0000 meq | INTRAVENOUS | Status: AC
  Administered 2023-07-30 (×6): 10 meq via INTRAVENOUS
  Filled 2023-07-30 (×6): qty 50

## 2023-07-30 MED ORDER — AMIODARONE LOAD VIA INFUSION
150.0000 mg | Freq: Once | INTRAVENOUS | Status: AC
  Administered 2023-07-30: 150 mg via INTRAVENOUS
  Filled 2023-07-30: qty 83.34

## 2023-07-30 NOTE — Progress Notes (Signed)
Patient ID: Donna Guerrero, female   DOB: 1941-01-17, 82 y.o.   MRN: [id number] TCTS Evening Rounds:  Hemodynamically stable in sinus rhythm on IV amio for atrial fib this am.  UO ok K+ back down to 3.4 this pm and we are replenishing.   Nausea better.  Ambulated a short lap.   Working on IS.  BMET    Component Value Date/Time   NA 136 07/30/2023 1535   NA 138 06/21/2023 1437   K 3.4 (L) 07/30/2023 1535   CL 101 07/30/2023 1535   CO2 25 07/30/2023 1535   GLUCOSE 156 (H) 07/30/2023 1535   BUN 16 07/30/2023 1535   BUN 24 06/21/2023 1437   CREATININE 1.13 (H) 07/30/2023 1535   CALCIUM 8.8 (L) 07/30/2023 1535   EGFR 62 06/21/2023 1437   GFRNONAA 49 (L) 07/30/2023 1535   CBC    Component Value Date/Time   WBC 12.2 (H) 07/30/2023 1535   RBC 3.49 (L) 07/30/2023 1535   HGB 10.3 (L) 07/30/2023 1535   HGB 13.5 07/05/2023 1608   HCT 31.9 (L) 07/30/2023 1535   HCT 42.4 07/05/2023 1608   PLT 123 (L) 07/30/2023 1535   PLT 254 07/05/2023 1608   MCV 91.4 07/30/2023 1535   MCV 96 07/05/2023 1608   MCH 29.5 07/30/2023 1535   MCHC 32.3 07/30/2023 1535   RDW 15.1 07/30/2023 1535   RDW 12.5 07/05/2023 1608

## 2023-07-30 NOTE — TOC Benefit Eligibility Note (Signed)
Pharmacy Patient Advocate Encounter  Insurance verification completed.    The patient is insured through [facility]. Patient has Medicare and is not eligible for a copay card, but may be able to apply for patient assistance, if available.    Ran test claim for Marcelline Deist and the current 30 day co-pay is $47.00.  Ran test claim for Jardiance and the current 30 day co-pay is $47.00.  This test claim was processed through [facility]- copay amounts may vary at other pharmacies due to pharmacy/plan contracts, or as the patient moves through the different stages of their insurance plan.

## 2023-07-30 NOTE — Progress Notes (Signed)
Appears comfortable  Back in sinus Tubes are out.  Still having intermittent nausea  Tolerated ambulation  Plan Cont as outlined care

## 2023-07-30 NOTE — Progress Notes (Addendum)
1 Day Post-Op Procedure(s) (LRB): CORONARY ARTERY BYPASS GRAFTING X 3 USING LEFT INTERNAL MAMMARY ARTERY AND RIGHT GREAT SAPHENOUS VEIN, HARVESTED ENDOSCOPICALLY (N/A) TRANSESOPHAGEAL ECHOCARDIOGRAM (N/A) Subjective: Some nausea and incisional pain.  Objective: Vital signs in last 24 hours: Temp:  [94.1 F (34.5 C)-99.1 F (37.3 C)] 98.8 F (37.1 C) (10/25 0530) Pulse Rate:  [56-90] 62 (10/25 0630) Cardiac Rhythm: Normal sinus rhythm (10/24 2000) Resp:  [10-20] 12 (10/25 0630) BP: (107-119)/(50-58) 108/50 (10/25 0600) SpO2:  [70 %-100 %] 97 % (10/25 0630) Arterial Line BP: (88-174)/(39-109) 110/41 (10/25 0630) FiO2 (%):  [40 %-50 %] 40 % (10/24 2000) Weight:  [64 kg] 64 kg (10/25 0545)  Hemodynamic parameters for last 24 hours: CVP:  [1 mmHg-24 mmHg] 1 mmHg CO:  [3.4 L/min-4.7 L/min] 4.2 L/min CI:  [2.2 L/min/m2-3 L/min/m2] 2.6 L/min/m2  Intake/Output from previous day: 10/24 0701 - 10/25 0700 In: 5788.4 [P.O.:240; I.V.:3024.2; NG/GT:20; IV Piggyback:2504.2] Out: 5035 [Urine:4285; Blood:350; Chest Tube:400] Intake/Output this shift: No intake/output data recorded.  General appearance: alert and cooperative Neurologic: intact Heart: regular rate and rhythm Lungs: diminished breath sounds bibasilar Extremities: edema mild Wound: dressing dry  Lab Results: Recent Labs    07/29/23 1904 07/29/23 2013 07/30/23 0511  WBC 8.7  --  11.1*  HGB 10.4* 10.9* 10.6*  HCT 30.5* 32.0* 32.0*  PLT 103*  --  135*   BMET:  Recent Labs    07/29/23 1904 07/29/23 2013 07/30/23 0511  NA 141 141 142  K 3.9 4.1 3.3*  CL 107  --  104  CO2 24  --  22  GLUCOSE 108*  --  146*  BUN 14  --  15  CREATININE 0.78  --  0.93  CALCIUM 9.3  --  8.6*    PT/INR:  Recent Labs    07/29/23 1337  LABPROT 18.5*  INR 1.5*   ABG    Component Value Date/Time   PHART 7.301 (L) 07/29/2023 2013   HCO3 24.3 07/29/2023 2013   TCO2 26 07/29/2023 2013   ACIDBASEDEF 2.0 07/29/2023 2013   O2SAT  97 07/29/2023 2013   CBG (last 3)  Recent Labs    07/30/23 0315 07/30/23 0515 07/30/23 0633  GLUCAP 126* 160* 130*   CXR: bibasilar atelectasis, may be small right pleural effusion  ECG: sinus, no acute changes.  Assessment/Plan: S/P Procedure(s) (LRB): CORONARY ARTERY BYPASS GRAFTING X 3 USING LEFT INTERNAL MAMMARY ARTERY AND RIGHT GREAT SAPHENOUS VEIN, HARVESTED ENDOSCOPICALLY (N/A) TRANSESOPHAGEAL ECHOCARDIOGRAM (N/A)  POD1  Hemodynamically stable in sinus rhythm 69. Hold Lopressor today.  Wt is 8 lbs over preop. Start diuresis and continue replacing K+  Chest tube output low. Will remove later today.  DC arterial line.  Reglan and Zofran for nausea. She has hx of nausea/vomiting with codeine and has vomited after oxy and tramadol so will stop these and use low dose toradol with tylenol for pain since creat normal. Add low dose phenergan prn for nausea.  IS, OOB.  Glucose under good control. Transition to SSI. Preop Hgb A1c was 6.9 on no meds.   LOS: 1 day    Alleen Borne 07/30/2023

## 2023-07-30 NOTE — Progress Notes (Addendum)
NAME:  Donna Guerrero, MRN:  [id number], DOB:  November 12, 1940, LOS: 1 ADMISSION DATE:  07/29/2023, CONSULTATION DATE:  10/24 REFERRING MD:  Cliffton Asters, CHIEF COMPLAINT:  post op critical care    History of Present Illness:  82 year old female with history as outlined with known three-vessel coronary artery disease and progressive exertional dyspnea was referred to cardiothoracic surgery and now status post CABG x 3 vessels, LIMA LAD, RSVG PDA, OM with endoscopic harvest of right saphenous vein. Bypass initiated at 1058 and discontinued at 1208 EBL 350 mL Returned to the intensive care on full ventilatory support slightly hypothermic not requiring vasoactive drips Pertinent  Medical History  Type 2 diabetes, essential tremor, glaucoma, hyperlipidemia but intolerant of statins, hypertension, hypothyroidism, rosacea, remote whooping cough  Significant Hospital Events: Including procedures, antibiotic start and stop dates in addition to other pertinent events   10/24 three-vessel CABG [place]. Extubated that evening.  10/25 surgical team removing chest tubes  Interim History / Subjective:  Tmax 99.1 extubated last night. A little hypokalemic otherwise all labs look pretty good.  Having expected postop discomfort Objective   Blood pressure (!) 108/50, pulse 62, temperature 98.8 F (37.1 C), resp. rate 12, height 5' (1.524 m), weight 64 kg, SpO2 97%. CVP:  [1 mmHg-24 mmHg] 1 mmHg CO:  [3.4 L/min-4.7 L/min] 4.2 L/min CI:  [2.2 L/min/m2-3 L/min/m2] 2.6 L/min/m2  Vent Mode: PSV;CPAP FiO2 (%):  [40 %-50 %] 40 % Set Rate:  [4 bmp-16 bmp] 4 bmp Vt Set:  [360 mL] 360 mL PEEP:  [5 cmH20] 5 cmH20 Pressure Support:  [10 cmH20-11 cmH20] 10 cmH20 Plateau Pressure:  [18 cmH20] 18 cmH20   Intake/Output Summary (Last 24 hours) at 07/30/2023 0738 Last data filed at 07/30/2023 0554 Gross per 24 hour  Intake 5788.4 ml  Output 5035 ml  Net 753.4 ml   Filed Weights   07/29/23 0645 07/30/23 0545   Weight: 60.3 kg 64 kg    Examination: General This is a pleasant 82 year old female patient resting in the chair no acute distress, but does have some mild nausea following narcotic HEENT normocephalic atraumatic no jugular venous distention Pulmonary some crackles both bases no accessory use currently on nasal cannula.  Mediastinal and pleural tubes airleak however after evaluation found there was malfxn of sahara system  Cardiac: Regular rate and rhythm.  Mild cardiac rub, mediastinal dressing clean dry and intact, mediastinal tube and pleural tubesw/ decreased output Ext warm and dry, does have upper extremity dependent swelling Neuro awake oriented no focal deficits  Resolved Hospital Problem list     Assessment & Plan:  Three-vessel coronary artery disease now status post coronary artery bypass grafting x 3 with endoscopic harvest of right saphenous vein Plan Continue telemetry monitoring Continue monitor hemodynamic indices Aspirin, metoprolol and Pravachol per surgical team Multimodal pain management Mobilize IS  Mediastinal tube per surgical team.  Plan to discontinue today  Hypokalemia Plan Replace and recheck  Postoperative anemia (expected) EBL recorded at 350 -Baseline hemoglobin 12.4, stable at 10.6 w/out evidence of bleeding last night  Plan Watch for evidence of bleeding Am cbc  Post-operative thrombocytopenia Improved from yesterday. Prognostically a good marker  Plan Trend  Nausea and vomiting.Felt 2/2 narcotics.  Plan PRN antiemetics Meds changed per primary service  Type 2 diabetes Plan Still insulin gtt but minimal  Will switch to SSI  Hypothyroidism Plan Synthroid ordered  Glaucoma Plan Continue eyedrops  Best Practice (right click and "Reselect all SmartList Selections" daily)   Diet/type: NPO  DVT prophylaxis: SCD GI prophylaxis: PPI Lines: Central line Foley:  Yes, and it is still needed Code Status:  full code Last date of  multidisciplinary goals of care discussion [per primary ]  Critical care time:   NA   Simonne Martinet ACNP-BC [facility] Pulmonary/Critical Care Pager # [phone] OR # [phone] if no answer

## 2023-07-31 ENCOUNTER — Inpatient Hospital Stay (HOSPITAL_COMMUNITY): Payer: Medicare Other

## 2023-07-31 DIAGNOSIS — Z951 Presence of aortocoronary bypass graft: Secondary | ICD-10-CM | POA: Diagnosis not present

## 2023-07-31 LAB — GLUCOSE, CAPILLARY
Glucose-Capillary: 101 mg/dL — ABNORMAL HIGH (ref 70–99)
Glucose-Capillary: 244 mg/dL — ABNORMAL HIGH (ref 70–99)
Glucose-Capillary: 154 mg/dL — ABNORMAL HIGH (ref 70–99)
Glucose-Capillary: 96 mg/dL (ref 70–99)
Glucose-Capillary: 204 mg/dL — ABNORMAL HIGH (ref 70–99)

## 2023-07-31 LAB — BASIC METABOLIC PANEL
Anion gap: 9 (ref 5–15)
BUN: 19 mg/dL (ref 8–23)
CO2: 27 mmol/L (ref 22–32)
Calcium: 8.7 mg/dL — ABNORMAL LOW (ref 8.9–10.3)
Chloride: 101 mmol/L (ref 98–111)
Creatinine, Ser: 1.28 mg/dL — ABNORMAL HIGH (ref 0.44–1.00)
GFR, Estimated: 42 mL/min — ABNORMAL LOW (ref >60)
Glucose, Bld: 122 mg/dL — ABNORMAL HIGH (ref 70–99)
Potassium: 3.3 mmol/L — ABNORMAL LOW (ref 3.5–5.1)
Sodium: 137 mmol/L (ref 135–145)

## 2023-07-31 LAB — CBC
HCT: 30.1 % — ABNORMAL LOW (ref 36.0–46.0)
Hemoglobin: 9.9 g/dL — ABNORMAL LOW (ref 12.0–15.0)
MCH: 29.9 pg (ref 26.0–34.0)
MCHC: 32.9 g/dL (ref 30.0–36.0)
MCV: 90.9 fL (ref 80.0–100.0)
Platelets: 102 10*3/uL — ABNORMAL LOW (ref 150–400)
RBC: 3.31 MIL/uL — ABNORMAL LOW (ref 3.87–5.11)
RDW: 14.8 % (ref 11.5–15.5)
WBC: 11.3 10*3/uL — ABNORMAL HIGH (ref 4.0–10.5)
nRBC: 0 % (ref 0.0–0.2)

## 2023-07-31 LAB — POTASSIUM: Potassium: 3.9 mmol/L (ref 3.5–5.1)

## 2023-07-31 LAB — LIPID PANEL
Cholesterol: 113 mg/dL (ref 0–200)
HDL: 43 mg/dL (ref >40)
LDL Cholesterol: 58 mg/dL (ref 0–99)
Total CHOL/HDL Ratio: 2.6 {ratio}
Triglycerides: 61 mg/dL (ref <150)
VLDL: 12 mg/dL (ref 0–40)

## 2023-07-31 MED ORDER — POTASSIUM CHLORIDE CRYS ER 20 MEQ PO TBCR
20.0000 meq | EXTENDED_RELEASE_TABLET | Freq: Once | ORAL | Status: AC
  Administered 2023-07-31: 20 meq via ORAL
  Filled 2023-07-31: qty 1

## 2023-07-31 MED ORDER — INSULIN ASPART 100 UNIT/ML IJ SOLN
0.0000 [IU] | Freq: Three times a day (TID) | INTRAMUSCULAR | Status: DC
  Administered 2023-07-31: 2 [IU] via SUBCUTANEOUS
  Administered 2023-08-01: 4 [IU] via SUBCUTANEOUS
  Administered 2023-08-01 – 2023-08-02 (×3): 2 [IU] via SUBCUTANEOUS
  Administered 2023-08-02: 4 [IU] via SUBCUTANEOUS
  Administered 2023-08-03: 2 [IU] via SUBCUTANEOUS

## 2023-07-31 MED ORDER — AMIODARONE HCL 200 MG PO TABS
200.0000 mg | ORAL_TABLET | Freq: Two times a day (BID) | ORAL | Status: DC
  Administered 2023-07-31 – 2023-08-03 (×7): 200 mg via ORAL
  Filled 2023-07-31 (×7): qty 1

## 2023-07-31 MED ORDER — POTASSIUM CHLORIDE CRYS ER 20 MEQ PO TBCR
40.0000 meq | EXTENDED_RELEASE_TABLET | Freq: Once | ORAL | Status: AC
  Administered 2023-07-31: 40 meq via ORAL
  Filled 2023-07-31: qty 2

## 2023-07-31 MED ORDER — LATANOPROSTENE BUNOD 0.024 % OP SOLN
1.0000 [drp] | Freq: Every day | OPHTHALMIC | Status: DC
  Administered 2023-07-31 – 2023-08-02 (×3): 1 [drp] via OPHTHALMIC

## 2023-07-31 MED ORDER — METOPROLOL TARTRATE 12.5 MG HALF TABLET
12.5000 mg | ORAL_TABLET | Freq: Two times a day (BID) | ORAL | Status: DC
  Administered 2023-07-31 – 2023-08-02 (×5): 12.5 mg via ORAL
  Filled 2023-07-31 (×5): qty 1

## 2023-07-31 MED ORDER — HYDRALAZINE HCL 20 MG/ML IJ SOLN
10.0000 mg | INTRAMUSCULAR | Status: DC | PRN
  Administered 2023-08-01: 10 mg via INTRAVENOUS
  Filled 2023-07-31: qty 1

## 2023-07-31 MED ORDER — POTASSIUM CHLORIDE 10 MEQ/50ML IV SOLN
10.0000 meq | INTRAVENOUS | Status: DC
  Administered 2023-07-31 (×2): 10 meq via INTRAVENOUS
  Filled 2023-07-31 (×3): qty 50

## 2023-07-31 MED ORDER — HYDRALAZINE HCL 20 MG/ML IJ SOLN
INTRAMUSCULAR | Status: AC
  Administered 2023-07-31: 10 mg via INTRAVENOUS
  Filled 2023-07-31: qty 1

## 2023-07-31 NOTE — Progress Notes (Signed)
Patient ID: Donna Guerrero, female   DOB: [birth year], 82 y.o.   MRN: [id number] TCTS Evening Rounds:  Hemodynamically stable with rising BP. Sinus 90's. Will start Lopressor 12.5 bid.  Diuresing on her own today.  Ambulated around the ICU.  Passing flatus but no BM yet.

## 2023-07-31 NOTE — Progress Notes (Signed)
2 Days Post-Op Procedure(s) (LRB): CORONARY ARTERY BYPASS GRAFTING X 3 USING LEFT INTERNAL MAMMARY ARTERY AND RIGHT GREAT SAPHENOUS VEIN, HARVESTED ENDOSCOPICALLY (N/A) TRANSESOPHAGEAL ECHOCARDIOGRAM (N/A) Subjective: No complaints. Ambulated medium loop this am. No BM yet. No further nausea Objective: Vital signs in last 24 hours: Temp:  [98.5 F (36.9 C)-98.9 F (37.2 C)] 98.7 F (37.1 C) (10/26 0915) Pulse Rate:  [58-84] 61 (10/26 0800) Cardiac Rhythm: Normal sinus rhythm;Sinus bradycardia (10/26 0800) Resp:  [9-20] 17 (10/26 0800) BP: (99-152)/(45-110) 121/48 (10/26 0800) SpO2:  [92 %-100 %] 97 % (10/26 0800) Weight:  [64.1 kg] 64.1 kg (10/26 0500)  Hemodynamic parameters for last 24 hours:    Intake/Output from previous day: 10/25 0701 - 10/26 0700 In: 1684.9 [P.O.:50; I.V.:700.9; IV Piggyback:934.1] Out: 1365 [Urine:1345; Chest Tube:20] Intake/Output this shift: Total I/O In: 86.8 [I.V.:21.9; IV Piggyback:64.9] Out: 55 [Urine:55]  General appearance: alert and cooperative Neurologic: intact Heart: regular rate and rhythm Lungs: clear to auscultation bilaterally Abdomen: soft, non-tender; bowel sounds normal; no masses,  no organomegaly Extremities: edema mild Wound: dressing dry  Lab Results: Recent Labs    07/30/23 1535 07/31/23 0437  WBC 12.2* 11.3*  HGB 10.3* 9.9*  HCT 31.9* 30.1*  PLT 123* 102*   BMET:  Recent Labs    07/30/23 1535 07/31/23 0437  NA 136 137  K 3.4* 3.3*  CL 101 101  CO2 25 27  GLUCOSE 156* 122*  BUN 16 19  CREATININE 1.13* 1.28*  CALCIUM 8.8* 8.7*    PT/INR:  Recent Labs    07/29/23 1337  LABPROT 18.5*  INR 1.5*   ABG    Component Value Date/Time   PHART 7.301 (L) 07/29/2023 2013   HCO3 24.3 07/29/2023 2013   TCO2 26 07/29/2023 2013   ACIDBASEDEF 2.0 07/29/2023 2013   O2SAT 97 07/29/2023 2013   CBG (last 3)  Recent Labs    07/30/23 2344 07/31/23 0318 07/31/23 0912  GLUCAP 114* 101* 244*     Assessment/Plan: S/P Procedure(s) (LRB): CORONARY ARTERY BYPASS GRAFTING X 3 USING LEFT INTERNAL MAMMARY ARTERY AND RIGHT GREAT SAPHENOUS VEIN, HARVESTED ENDOSCOPICALLY (N/A) TRANSESOPHAGEAL ECHOCARDIOGRAM (N/A)  POD 2 Hemodynamically stable in sinus rhythm on IV amio. Will switch amio to po.  I/O even yesterday despite lasix. Creat bumped slightly probably related to diuresis and possibly toradol. Will hold off on further diuresis today and  stop Toradol.  Wt stable from yesterday but still 8 lbs over preop.  Continue IS, ambulation.   LOS: 2 days    Alleen Borne 07/31/2023

## 2023-07-31 NOTE — Progress Notes (Signed)
NAME:  Donna Guerrero, MRN:  [id number], DOB:  04-16-1941, LOS: 2 ADMISSION DATE:  07/29/2023, CONSULTATION DATE:  10/24 REFERRING MD:  Cliffton Asters, CHIEF COMPLAINT:  post op critical care    History of Present Illness:  82 year old female with history as outlined with known three-vessel coronary artery disease and progressive exertional dyspnea was referred to cardiothoracic surgery and now status post CABG x 3 vessels, LIMA LAD, RSVG PDA, OM with endoscopic harvest of right saphenous vein. Bypass initiated at 1058 and discontinued at 1208 EBL 350 mL Returned to the intensive care on full ventilatory support slightly hypothermic not requiring vasoactive drips  Pertinent  Medical History  Type 2 diabetes, essential tremor, glaucoma, hyperlipidemia but intolerant of statins, hypertension, hypothyroidism, rosacea, remote whooping cough  Significant Hospital Events: Including procedures, antibiotic start and stop dates in addition to other pertinent events   10/24 three-vessel CABG [place]. Extubated that evening.  10/25 surgical team removing chest tubes 10/26 no acute events overnight  Interim History / Subjective:  Seen sitting up in bedside recliner with no acute complaints Working with I-S  Objective   Blood pressure 99/67, pulse 72, temperature 98.9 F (37.2 C), temperature source Oral, resp. rate 16, height 5' (1.524 m), weight 64.1 kg, SpO2 95%. CVP:  [5 mmHg-8 mmHg] 6 mmHg CO:  [3.9 L/min-4.8 L/min] 3.9 L/min CI:  [2.5 L/min/m2-3 L/min/m2] 2.5 L/min/m2      Intake/Output Summary (Last 24 hours) at 07/31/2023 0743 Last data filed at 07/31/2023 0641 Gross per 24 hour  Intake 1684.93 ml  Output 1365 ml  Net 319.93 ml   Filed Weights   07/29/23 0645 07/30/23 0545 07/31/23 0500  Weight: 60.3 kg 64 kg 64.1 kg    Examination: General: Acute on chronic ill-appearing elderly female sitting up in bedside recliner no acute distress HEENT: [place]/AT, MM pink/moist, PERRL,   Neuro: Alert and oriented x 3, nonfocal CV: s1s2 regular rate and rhythm, no murmur, rubs, or gallops,  PULM: Clear to auscultation bilaterally, no increased work of breathing, no added breath sounds,  GI: soft, bowel sounds active in all 4 quadrants, non-tender, non-distended, tolerating oral diet Extremities: warm/dry, no edema  Skin: no rashes or lesions  Resolved Hospital Problem list     Assessment & Plan:  Three-vessel coronary artery disease now status post coronary artery bypass grafting x 3 with endoscopic harvest of right saphenous vein P: Primary management Per CTCS Continuous telemetry Close monitoring continue aspirin, pravachol Mobilize as able Pulmonary hygiene frequent I-S Multimodal pain control Optimize electrolytes  Hypokalemia P: Trend BMP Supplement as needed  Postoperative anemia (expected)  -EBL recorded at 350. Baseline hemoglobin 12.4, stable at 10.6 w/out evidence of bleeding last night  P: Trend CBC Transfuse per protocol Hemoglobin goal greater than 7  Post-operative thrombocytopenia -Counts continue to wax and wane with slight drop a.m. 10/26 P: Trend CBC Platelet goal greater than 10 Monitor for signs of bleeding  Nausea and vomiting.Felt 2/2 narcotics.  P: As needed antiemetics  Type 2 diabetes P: Continue SSI CBG goal 140-180 CBG checks ACHS  Hypothyroidism P: Continue home Synthroid  Glaucoma P: Continue home eyedrops  Best Practice (right click and "Reselect all SmartList Selections" daily)   Diet/type: NPO DVT prophylaxis: SCD GI prophylaxis: PPI Lines: Central line Foley:  Yes, and it is still needed Code Status:  full code Last date of multidisciplinary goals of care discussion [per primary ]  Critical care time:  NA  Nylah Butkus D. Tiburcio Pea, NP-C [facility] Personal contact information can be found on Amion  If no contact or response made please call [phone] 07/31/2023, 7:55 AM

## 2023-08-01 LAB — TYPE AND SCREEN
ABO/RH(D): A POS
Antibody Screen: NEGATIVE
Unit division: 0
Unit division: 0
Unit division: 0
Unit division: 0

## 2023-08-01 LAB — BASIC METABOLIC PANEL
Anion gap: 7 (ref 5–15)
BUN: 17 mg/dL (ref 8–23)
CO2: 27 mmol/L (ref 22–32)
Calcium: 8.6 mg/dL — ABNORMAL LOW (ref 8.9–10.3)
Chloride: 101 mmol/L (ref 98–111)
Creatinine, Ser: 0.9 mg/dL (ref 0.44–1.00)
GFR, Estimated: >60 mL/min (ref >60)
Glucose, Bld: 148 mg/dL — ABNORMAL HIGH (ref 70–99)
Potassium: 3.8 mmol/L (ref 3.5–5.1)
Sodium: 135 mmol/L (ref 135–145)

## 2023-08-01 LAB — BPAM RBC
Blood Product Expiration Date: 202411172359
Blood Product Expiration Date: 202411182359
Blood Product Expiration Date: 202411192359
Blood Product Expiration Date: 202411192359
ISSUE DATE / TIME: 202410231538
ISSUE DATE / TIME: 202410240957
ISSUE DATE / TIME: 202410240957
Unit Type and Rh: 6200
Unit Type and Rh: 6200
Unit Type and Rh: 6200
Unit Type and Rh: 6200

## 2023-08-01 LAB — ECHO INTRAOPERATIVE TEE
AR max vel: 1.13 cm2
AV Area VTI: 0.88 cm2
AV Area mean vel: 1.13 cm2
AV Mean grad: 6.5 mm[Hg]
AV Peak grad: 12.7 mm[Hg]
AV Vena cont: 0.2 cm
Ao pk vel: 1.79 m/s
Area-P 1/2: 6.32 cm2
Height: 60 in
MV VTI: 2.09 cm2
Weight: 2128 [oz_av]

## 2023-08-01 LAB — GLUCOSE, CAPILLARY
Glucose-Capillary: 155 mg/dL — ABNORMAL HIGH (ref 70–99)
Glucose-Capillary: 173 mg/dL — ABNORMAL HIGH (ref 70–99)
Glucose-Capillary: 113 mg/dL — ABNORMAL HIGH (ref 70–99)
Glucose-Capillary: 203 mg/dL — ABNORMAL HIGH (ref 70–99)

## 2023-08-01 MED ORDER — ASPIRIN 325 MG PO TBEC
325.0000 mg | DELAYED_RELEASE_TABLET | Freq: Every day | ORAL | Status: DC
Start: 2023-08-01 — End: 2023-08-03
  Administered 2023-08-02 – 2023-08-03 (×2): 325 mg via ORAL
  Filled 2023-08-01 (×2): qty 1

## 2023-08-01 MED ORDER — HYDRALAZINE HCL 20 MG/ML IJ SOLN
INTRAMUSCULAR | Status: AC
  Filled 2023-08-01: qty 1

## 2023-08-01 MED ORDER — SODIUM CHLORIDE 0.9% FLUSH
3.0000 mL | INTRAVENOUS | Status: DC | PRN
Start: 2023-08-01 — End: 2023-08-01

## 2023-08-01 MED ORDER — SODIUM CHLORIDE 0.9 % IV SOLN
250.0000 mL | INTRAVENOUS | Status: DC | PRN
Start: 2023-08-01 — End: 2023-08-01

## 2023-08-01 MED ORDER — HYDROMORPHONE HCL 2 MG PO TABS: 1.0000 mg | ORAL_TABLET | Freq: Four times a day (QID) | ORAL | Status: DC | PRN

## 2023-08-01 MED ORDER — SODIUM CHLORIDE 0.9% FLUSH: 3.0000 mL | Freq: Two times a day (BID) | INTRAVENOUS | Status: DC

## 2023-08-01 MED ORDER — ~~LOC~~ CARDIAC SURGERY, PATIENT & FAMILY EDUCATION: Freq: Once | Status: AC

## 2023-08-01 MED ORDER — POTASSIUM CHLORIDE CRYS ER 20 MEQ PO TBCR
20.0000 meq | EXTENDED_RELEASE_TABLET | Freq: Two times a day (BID) | ORAL | Status: AC
  Administered 2023-08-01 (×2): 20 meq via ORAL
  Filled 2023-08-01 (×2): qty 1

## 2023-08-01 MED ORDER — HYDRALAZINE HCL 20 MG/ML IJ SOLN: 10.0000 mg | INTRAMUSCULAR | Status: DC | PRN

## 2023-08-01 MED ORDER — CHLORHEXIDINE GLUCONATE CLOTH 2 % EX PADS
6.0000 | MEDICATED_PAD | Freq: Every day | CUTANEOUS | Status: DC
  Administered 2023-08-02 – 2023-08-03 (×2): 6 via TOPICAL

## 2023-08-01 NOTE — Progress Notes (Signed)
Patient ID: Donna Guerrero, female   DOB: [birth year], 82 y.o.   MRN: [id number] TCTS Evening Rounds:  Hemodynamically stable in sinus rhythm.  Diuresing well on her own.  Ambulated.  Waiting on 4E bed.

## 2023-08-01 NOTE — Progress Notes (Signed)
Epicardial pacing wires discontinued per protocol at 1040. Vitals monitoring set to q15 per protocol with normal heart sounds auscultated, no sustained ectopy noted on monitor upon removal with lead intact. MAPs greater than 65 1 hour post removal.

## 2023-08-01 NOTE — Progress Notes (Signed)
3 Days Post-Op Procedure(s) (LRB): CORONARY ARTERY BYPASS GRAFTING X 3 USING LEFT INTERNAL MAMMARY ARTERY AND RIGHT GREAT SAPHENOUS VEIN, HARVESTED ENDOSCOPICALLY (N/A) TRANSESOPHAGEAL ECHOCARDIOGRAM (N/A) Subjective: No complaints.   Had some tightness and pain from incision overnight when she got up to bathroom and got anxious and hypertensive but received some morphine and was able to sleep all night. Feels fine this am.   Had BM Walked 500 ft this am.  Objective: Vital signs in last 24 hours: Temp:  [97.8 F (36.6 C)-99.1 F (37.3 C)] 98.6 F (37 C) (10/27 0800) Pulse Rate:  [64-87] 76 (10/27 0800) Cardiac Rhythm: Normal sinus rhythm (10/27 0800) Resp:  [11-28] 15 (10/27 0800) BP: (77-173)/(51-117) 119/57 (10/27 0800) SpO2:  [93 %-99 %] 97 % (10/27 0800) Weight:  [61.9 kg] 61.9 kg (10/27 0400)  Hemodynamic parameters for last 24 hours:    Intake/Output from previous day: 10/26 0701 - 10/27 0700 In: 347.9 [P.O.:180; I.V.:95.1; IV Piggyback:72.9] Out: 1850 [Urine:1850] Intake/Output this shift: Total I/O In: -  Out: 80 [Urine:80]  General appearance: alert and cooperative Neurologic: intact Heart: regular rate and rhythm, S1, S2 normal, no murmur Lungs: clear to auscultation bilaterally Extremities: no edema Wound: incision looks good.  Lab Results: Recent Labs    07/30/23 1535 07/31/23 0437  WBC 12.2* 11.3*  HGB 10.3* 9.9*  HCT 31.9* 30.1*  PLT 123* 102*   BMET:  Recent Labs    07/31/23 0437 07/31/23 1727 08/01/23 0528  NA 137  --  135  K 3.3* 3.9 3.8  CL 101  --  101  CO2 27  --  27  GLUCOSE 122*  --  148*  BUN 19  --  17  CREATININE 1.28*  --  0.90  CALCIUM 8.7*  --  8.6*    PT/INR:  Recent Labs    07/29/23 1337  LABPROT 18.5*  INR 1.5*   ABG    Component Value Date/Time   PHART 7.301 (L) 07/29/2023 2013   HCO3 24.3 07/29/2023 2013   TCO2 26 07/29/2023 2013   ACIDBASEDEF 2.0 07/29/2023 2013   O2SAT 97 07/29/2023 2013   CBG (last  3)  Recent Labs    07/31/23 1626 07/31/23 2130 08/01/23 0613  GLUCAP 96 204* 155*    Assessment/Plan: S/P Procedure(s) (LRB): CORONARY ARTERY BYPASS GRAFTING X 3 USING LEFT INTERNAL MAMMARY ARTERY AND RIGHT GREAT SAPHENOUS VEIN, HARVESTED ENDOSCOPICALLY (N/A) TRANSESOPHAGEAL ECHOCARDIOGRAM (N/A)  POD 3  Hemodynamically stable this am with normal BP. Hold off on adding back her lisinopril and norvasc to avoid hypotension.  Remains in sinus on po amio and lopressor.  She diuresed -1500 cc on her own yesterday. Wt is only 3.5 lbs over preop and still making good urine so will hold off on further diuresis. Creat back to normal.  Glucose is still 140-150's this am and was 204 last pm. She was not on any meds preop but Hgb A1c was 6.9. She will need oral agent started when eating better.  DC pacing wires, sleeve and foley.  Transfer to 4E and continue IS, ambulation.   LOS: 3 days    Alleen Borne 08/01/2023

## 2023-08-01 NOTE — Anesthesia Postprocedure Evaluation (Signed)
Anesthesia Post Note  Patient: Donna Guerrero  Procedure(s) Performed: CORONARY ARTERY BYPASS GRAFTING X 3 USING LEFT INTERNAL MAMMARY ARTERY AND RIGHT GREAT SAPHENOUS VEIN, HARVESTED ENDOSCOPICALLY (Chest) TRANSESOPHAGEAL ECHOCARDIOGRAM     Patient location during evaluation: SICU Anesthesia Type: General Level of consciousness: sedated Pain management: pain level controlled Vital Signs Assessment: post-procedure vital signs reviewed and stable Respiratory status: patient remains intubated per anesthesia plan Cardiovascular status: stable Postop Assessment: no apparent nausea or vomiting Anesthetic complications: no   There were no known notable events for this encounter.                 Preslie Depasquale

## 2023-08-02 LAB — GLUCOSE, CAPILLARY
Glucose-Capillary: 139 mg/dL — ABNORMAL HIGH (ref 70–99)
Glucose-Capillary: 140 mg/dL — ABNORMAL HIGH (ref 70–99)
Glucose-Capillary: 189 mg/dL — ABNORMAL HIGH (ref 70–99)
Glucose-Capillary: 138 mg/dL — ABNORMAL HIGH (ref 70–99)

## 2023-08-02 MED ORDER — LIVING WELL WITH DIABETES BOOK
Freq: Once | Status: AC
  Filled 2023-08-02: qty 1

## 2023-08-02 MED ORDER — FUROSEMIDE 40 MG PO TABS
40.0000 mg | ORAL_TABLET | Freq: Every day | ORAL | Status: DC
  Administered 2023-08-02 – 2023-08-03 (×2): 40 mg via ORAL
  Filled 2023-08-02 (×2): qty 1

## 2023-08-02 MED ORDER — OXYCODONE HCL 5 MG PO TABS: 5.0000 mg | ORAL_TABLET | ORAL | Status: DC | PRN

## 2023-08-02 MED ORDER — POTASSIUM CHLORIDE CRYS ER 20 MEQ PO TBCR
20.0000 meq | EXTENDED_RELEASE_TABLET | Freq: Two times a day (BID) | ORAL | Status: DC
  Administered 2023-08-02 – 2023-08-03 (×3): 20 meq via ORAL
  Filled 2023-08-02 (×3): qty 1

## 2023-08-02 MED ORDER — INFLUENZA VAC A&B SURF ANT ADJ 0.5 ML IM SUSY
0.5000 mL | PREFILLED_SYRINGE | INTRAMUSCULAR | Status: DC
  Filled 2023-08-02: qty 0.5

## 2023-08-02 NOTE — Progress Notes (Signed)
Seen pt in chair, she was educated on CRP2. Pt in interested in program, will refer to [facility].    [id number]  Faustino Congress 08/02/2023 12:50 PM

## 2023-08-02 NOTE — Progress Notes (Addendum)
      [address]       [place] [address]             [phone]      4 Days Post-Op Procedure(s) (LRB): CORONARY ARTERY BYPASS GRAFTING X 3 USING LEFT INTERNAL MAMMARY ARTERY AND RIGHT GREAT SAPHENOUS VEIN, HARVESTED ENDOSCOPICALLY (N/A) TRANSESOPHAGEAL ECHOCARDIOGRAM (N/A) Subjective: Patient without complaints this AM. Ate breakfast, no further nausea.   Objective: Vital signs in last 24 hours: Temp:  [97.8 F (36.6 C)-99.1 F (37.3 C)] 99.1 F (37.3 C) (10/27 2100) Pulse Rate:  [65-88] 75 (10/28 0700) Cardiac Rhythm: Normal sinus rhythm (10/28 0400) Resp:  [14-28] 14 (10/28 0700) BP: (88-165)/(49-91) 124/64 (10/28 0700) SpO2:  [94 %-99 %] 96 % (10/28 0700) Weight:  [64.4 kg] 64.4 kg (10/28 0537)  Hemodynamic parameters for last 24 hours:    Intake/Output from previous day: 10/27 0701 - 10/28 0700 In: -  Out: 2445 [Urine:2445] Intake/Output this shift: No intake/output data recorded.  General appearance: alert, cooperative, and no distress Neurologic: intact Heart: regular rate and rhythm, S1, S2 normal, no murmur, click, rub or gallop Lungs: diminished bibasilar breath sounds Abdomen: soft, non-tender; bowel sounds normal; no masses,  no organomegaly Extremities: edema 1+ Wound: Clean and dry incisions without sign of infection  Lab Results: Recent Labs    07/30/23 1535 07/31/23 0437  WBC 12.2* 11.3*  HGB 10.3* 9.9*  HCT 31.9* 30.1*  PLT 123* 102*   BMET:  Recent Labs    07/31/23 0437 07/31/23 1727 08/01/23 0528  NA 137  --  135  K 3.3* 3.9 3.8  CL 101  --  101  CO2 27  --  27  GLUCOSE 122*  --  148*  BUN 19  --  17  CREATININE 1.28*  --  0.90  CALCIUM 8.7*  --  8.6*    PT/INR: No results for input(s): "LABPROT", "INR" in the last 72 hours. ABG    Component Value Date/Time   PHART 7.301 (L) 07/29/2023 2013   HCO3 24.3 07/29/2023 2013   TCO2 26 07/29/2023 2013   ACIDBASEDEF 2.0 07/29/2023 2013   O2SAT 97 07/29/2023  2013   CBG (last 3)  Recent Labs    08/01/23 1542 08/01/23 2106 08/02/23 0609  GLUCAP 113* 203* 139*    Assessment/Plan: S/P Procedure(s) (LRB): CORONARY ARTERY BYPASS GRAFTING X 3 USING LEFT INTERNAL MAMMARY ARTERY AND RIGHT GREAT SAPHENOUS VEIN, HARVESTED ENDOSCOPICALLY (N/A) TRANSESOPHAGEAL ECHOCARDIOGRAM (N/A)  CV: BP overall controlled on Lopressor 12.5mg  BID. Holding Lisinopril and Norvasc to avoid hypotension. On Amiodarone 200mg  BID due to hx of postop afib.  Pulm: Saturating well on RA. Ambulating well. Encourage IS and ambulation.   GI: Hx of nausea and vomiting, has improved. +BM  Endo: Hypothyroid, on levothyroxine. Preop A1c 6.9. No home DM meds. Some hyperglycemia, CBGs 113/203/139. On SSI PRN.  Patient has not tolerated Glimepiride, Empagliflozin and Metformin in the past. Will consult diabetes coordinator to start on PO med vs continue diet and follow up with PCP.   Renal: Creatinine 0.9. +8lbs, diuresis was held due to bump in creatinine 10/26. Will diurese today and supplement K.   ID: Reactive leukocytosis trending down. Tmax 99.1.   DVT Prophylaxis: Lovenox  Dispo: 4E bed pending. Likely d/c home tomorrow   LOS: 4 days    Jenny Reichmann, PA-C 08/02/2023  Agree with above Floor Dispo planning  Reina Wilton O [place]

## 2023-08-02 NOTE — Inpatient Diabetes Management (Signed)
Inpatient Diabetes Program Recommendations  AACE/ADA: New Consensus Statement on Inpatient Glycemic Control (2015)  Target Ranges:  Prepandial:   less than 140 mg/dL      Peak postprandial:   less than 180 mg/dL (1-2 hours)      Critically ill patients:  140 - 180 mg/dL   Lab Results  Component Value Date   GLUCAP 140 (H) 08/02/2023   HGBA1C 6.9 (H) 07/28/2023    Review of Glycemic Control  Latest Reference Range & Units 08/01/23 15:42 08/01/23 21:06 08/02/23 06:09 08/02/23 11:44  Glucose-Capillary 70 - 99 mg/dL 562 (H) 130 (H) 865 (H) 140 (H)  (H): Data is abnormally high Diabetes history: Type 2 DM Outpatient Diabetes medications: none Current orders for Inpatient glycemic control: Novolog 0-24 units TID  Inpatient Diabetes Program Recommendations:    Spoke with patient regarding new onset DM consult, however, patient has been diagnosed for several years and is followed by Dr Sharl Ma outpatient endocrinology.  Reviewed patient's current A1c of 6.9%. Explained what a A1c is and what it measures. Also reviewed goal A1c with patient, importance of good glucose control @ home, and blood sugar goals. Reviewed importance of keeping good control post op, survival skills, interventions, when to follow up with PCP/endo, GLPs, questions to ask at next visit, importance of protein, continued mindfulness and age appropriate goals. Patient will schedule follow up and has meter/supplies.   Thanks, Lujean Rave, MSN, RNC-OB Diabetes Coordinator [phone] (8a-5p)

## 2023-08-02 NOTE — Progress Notes (Signed)
Patient arrived from  [facility] VSS alert and oriented x 4 CCMD notifed, CHG bath completed, Bed in lowest position with call light in reach current plan of care in progress

## 2023-08-03 ENCOUNTER — Other Ambulatory Visit (HOSPITAL_COMMUNITY): Payer: Self-pay

## 2023-08-03 LAB — GLUCOSE, CAPILLARY: Glucose-Capillary: 132 mg/dL — ABNORMAL HIGH (ref 70–99)

## 2023-08-03 MED ORDER — AMIODARONE HCL 200 MG PO TABS
ORAL_TABLET | ORAL | 1 refills | Status: DC
  Filled 2023-08-03: qty 60, 55d supply, fill #0

## 2023-08-03 MED ORDER — FUROSEMIDE 40 MG PO TABS
40.0000 mg | ORAL_TABLET | Freq: Every day | ORAL | 0 refills | Status: DC
  Filled 2023-08-03: qty 7, 7d supply, fill #0

## 2023-08-03 MED ORDER — METOPROLOL TARTRATE 25 MG PO TABS
25.0000 mg | ORAL_TABLET | Freq: Two times a day (BID) | ORAL | 1 refills | Status: DC
  Filled 2023-08-03 – 2023-08-29 (×2): qty 60, 30d supply, fill #0

## 2023-08-03 MED ORDER — METOPROLOL TARTRATE 25 MG PO TABS
25.0000 mg | ORAL_TABLET | Freq: Two times a day (BID) | ORAL | Status: DC
  Administered 2023-08-03: 25 mg via ORAL
  Filled 2023-08-03: qty 1

## 2023-08-03 MED ORDER — OXYCODONE HCL 5 MG PO TABS
5.0000 mg | ORAL_TABLET | Freq: Four times a day (QID) | ORAL | 0 refills | Status: DC | PRN
  Filled 2023-08-03: qty 28, 7d supply, fill #0

## 2023-08-03 MED ORDER — ASPIRIN 325 MG PO TBEC: 325.0000 mg | DELAYED_RELEASE_TABLET | Freq: Every day | ORAL | Status: DC

## 2023-08-03 MED FILL — Potassium Chloride Inj 2 mEq/ML: INTRAVENOUS | Qty: 40 | Status: AC

## 2023-08-03 MED FILL — Heparin Sodium (Porcine) Inj 1000 Unit/ML: Qty: 1000 | Status: AC

## 2023-08-03 MED FILL — Lidocaine HCl Local Preservative Free (PF) Inj 2%: INTRAMUSCULAR | Qty: 14 | Status: AC

## 2023-08-03 NOTE — TOC Transition Note (Addendum)
Transition of Care (TOC) - CM/SW Discharge Note Donn Pierini RN, BSN Transitions of Care Unit 4E- RN Case Manager See Treatment Team for direct phone #   Patient Details  Name: Donna Guerrero MRN: [id number] Date of Birth: [birth year]  Transition of Care [facility]) CM/SW Contact:  Darrold Span, RN Phone Number: 08/03/2023, 9:48 AM   Clinical Narrative:    Pt stable for transition home today,  Family to transport home, No TOC needs for HH/DME noted. Pt to follow up as per AVS instructions.    Final next level of care: Home/Self Care Barriers to Discharge: No Barriers Identified   Patient Goals and CMS Choice   Choice offered to / list presented to : NA  Discharge Placement               Home          Discharge Plan and Services Additional resources added to the After Visit Summary for   In-house Referral: NA Discharge Planning Services: NA Post Acute Care Choice: NA          DME Arranged: N/A DME Agency: NA       HH Arranged: NA HH Agency: NA        Social Determinants of Health (SDOH) Interventions SDOH Screenings   Food Insecurity: No Food Insecurity (08/01/2023)  Housing: Patient Declined (08/01/2023)  Transportation Needs: No Transportation Needs (08/01/2023)  Utilities: Not At Risk (08/01/2023)  Tobacco Use: Low Risk  (07/29/2023)     Readmission Risk Interventions    08/03/2023    9:47 AM  Readmission Risk Prevention Plan  Post Dischage Appt Complete  Medication Screening Complete  Transportation Screening Complete

## 2023-08-03 NOTE — Progress Notes (Signed)
CARDIAC REHAB PHASE I   PRE:  Rate/Rhythm: 76 SR  BP:  Sitting: 135/79      SpO2: 96 RA  MODE:  Ambulation: 150 ft    POST:  Rate/Rhythm: 88 SR  BP:  Sitting: 129/83      SpO2: 96 RA  Pt ambulated with RW and standby assistance, pt walked well w/o symptoms and was helepd to BR before leaving.   Pt received OHS book and education on restrictions, heart healthy diet, ex guidelines, Move in the Tube sheet, incentive spirometer use when d/c and CRPII. Pt denies questions and was encouraged to look in the book for additional information. Referral placed to [facility] yesterday.     Faustino Congress  MS, ACSM-CEP 8:39 AM 08/03/2023    Service time is from 0810 to 0841.

## 2023-08-03 NOTE — Progress Notes (Cosign Needed Addendum)
      [address]       [place] [address]             [phone]      5 Days Post-Op Procedure(s) (LRB): CORONARY ARTERY BYPASS GRAFTING X 3 USING LEFT INTERNAL MAMMARY ARTERY AND RIGHT GREAT SAPHENOUS VEIN, HARVESTED ENDOSCOPICALLY (N/A) TRANSESOPHAGEAL ECHOCARDIOGRAM (N/A) Subjective: Patient states she has ambulated multiple times. She admits to some sternal pain with coughing and sneezing. Otherwise no new complaints.   Objective: Vital signs in last 24 hours: Temp:  [97.6 F (36.4 C)-98.6 F (37 C)] 98 F (36.7 C) (10/29 0300) Pulse Rate:  [65-86] 76 (10/29 0618) Cardiac Rhythm: Normal sinus rhythm (10/28 2030) Resp:  [13-22] 13 (10/29 0618) BP: (124-154)/(55-73) 125/67 (10/29 0300) SpO2:  [95 %-99 %] 99 % (10/29 0618) Weight:  [63.8 kg] 63.8 kg (10/29 0618)  Hemodynamic parameters for last 24 hours:    Intake/Output from previous day: 10/28 0701 - 10/29 0700 In: 420 [P.O.:420] Out: -  Intake/Output this shift: No intake/output data recorded.  General appearance: alert, cooperative, and no distress Neurologic: intact Heart: regular rate and rhythm, S1, S2 normal, no murmur, click, rub or gallop Lungs: minimally diminished bibasilar breath sounds Abdomen: soft, non-tender; bowel sounds normal; no masses,  no organomegaly Extremities: edema 1+ Wound: Clean and dry, no erythema or sign of infection  Lab Results: No results for input(s): "WBC", "HGB", "HCT", "PLT" in the last 72 hours. BMET:  Recent Labs    07/31/23 1727 08/01/23 0528  NA  --  135  K 3.9 3.8  CL  --  101  CO2  --  27  GLUCOSE  --  148*  BUN  --  17  CREATININE  --  0.90  CALCIUM  --  8.6*    PT/INR: No results for input(s): "LABPROT", "INR" in the last 72 hours. ABG    Component Value Date/Time   PHART 7.301 (L) 07/29/2023 2013   HCO3 24.3 07/29/2023 2013   TCO2 26 07/29/2023 2013   ACIDBASEDEF 2.0 07/29/2023 2013   O2SAT 97 07/29/2023 2013   CBG (last 3)   Recent Labs    08/02/23 1743 08/02/23 2141 08/03/23 0610  GLUCAP 189* 138* 132*    Assessment/Plan: S/P Procedure(s) (LRB): CORONARY ARTERY BYPASS GRAFTING X 3 USING LEFT INTERNAL MAMMARY ARTERY AND RIGHT GREAT SAPHENOUS VEIN, HARVESTED ENDOSCOPICALLY (N/A) TRANSESOPHAGEAL ECHOCARDIOGRAM (N/A)  CV: Some hypertension on Lopressor 12.5mg  BID, SBP 120s-150s. On Amiodarone 200mg  BID due to hx of postop afib. 30 minute burst of controlled rate afib this AM. Now NSR, HR 70s. Will titrate Lopressor.    Pulm: Saturating well on RA. Ambulating well. Encourage IS and ambulation.    GI: Hx of nausea and vomiting, resolved. +BM   Endo: Hypothyroid, on levothyroxine. Preop A1c 6.9. No home DM meds. CBGs 189/138/132 on SSI PRN.  Patient has not tolerated Glimepiride, Empagliflozin and Metformin in the past. Will not start any PO meds at this time since patient has discussed this with her PCP in the past. Outpatient PCP appointment has been arranged for close follow up.    Renal: Creatinine 0.9. +7lbs, continue Lasix and K supplement.    ID: Reactive leukocytosis trending down. Tmax 98.6    DVT Prophylaxis: Lovenox   Dispo: Patient has needed DME at home. D/C to home today?      LOS: 5 days    Jenny Reichmann, PA-C 08/03/2023

## 2023-08-03 NOTE — Plan of Care (Signed)
  Problem: Education: Goal: Will demonstrate proper wound care and an understanding of methods to prevent future damage Outcome: Adequate for Discharge Goal: Knowledge of disease or condition will improve Outcome: Adequate for Discharge Goal: Knowledge of the prescribed therapeutic regimen will improve Outcome: Adequate for Discharge Goal: Individualized Educational Video(s) Outcome: Adequate for Discharge   Problem: Activity: Goal: Risk for activity intolerance will decrease Outcome: Adequate for Discharge   Problem: Cardiac: Goal: Will achieve and/or maintain hemodynamic stability Outcome: Adequate for Discharge   Problem: Clinical Measurements: Goal: Postoperative complications will be avoided or minimized Outcome: Adequate for Discharge   Problem: Skin Integrity: Goal: Wound healing without signs and symptoms of infection Outcome: Adequate for Discharge Goal: Risk for impaired skin integrity will decrease Outcome: Adequate for Discharge   Problem: Urinary Elimination: Goal: Ability to achieve and maintain adequate renal perfusion and functioning will improve Outcome: Adequate for Discharge

## 2023-08-03 NOTE — Care Management Important Message (Signed)
Important Message  Patient Details  Name: RIO ROOKE MRN: [id number] Date of Birth: 02/19/1941   Important Message Given:  Yes - Medicare IM     Sherilyn Banker 08/03/2023, 12:15 PM

## 2023-08-05 MED FILL — Heparin Sodium (Porcine) Inj 1000 Unit/ML: INTRAMUSCULAR | Qty: 10 | Status: AC

## 2023-08-05 MED FILL — Electrolyte-R (PH 7.4) Solution: INTRAVENOUS | Qty: 3000 | Status: AC

## 2023-08-05 MED FILL — Mannitol IV Soln 20%: INTRAVENOUS | Qty: 500 | Status: AC

## 2023-08-05 MED FILL — Lidocaine HCl Local Soln Prefilled Syringe 100 MG/5ML (2%): INTRAMUSCULAR | Qty: 5 | Status: AC

## 2023-08-05 MED FILL — Sodium Chloride IV Soln 0.9%: INTRAVENOUS | Qty: 2000 | Status: AC

## 2023-08-05 MED FILL — Sodium Bicarbonate IV Soln 8.4%: INTRAVENOUS | Qty: 50 | Status: AC

## 2023-08-05 MED FILL — Calcium Chloride Inj 10%: INTRAVENOUS | Qty: 10 | Status: AC

## 2023-08-06 ENCOUNTER — Telehealth (HOSPITAL_COMMUNITY): Payer: Self-pay

## 2023-08-06 DIAGNOSIS — R350 Frequency of micturition: Secondary | ICD-10-CM | POA: Diagnosis not present

## 2023-08-06 NOTE — Telephone Encounter (Signed)
Referral recv'd and verified for MD signature. Follow up appointment is on 11/19@825 . Insurance benefits and eligibility TBD.

## 2023-08-09 ENCOUNTER — Telehealth (HOSPITAL_COMMUNITY): Payer: Self-pay

## 2023-08-09 DIAGNOSIS — I10 Essential (primary) hypertension: Secondary | ICD-10-CM | POA: Diagnosis not present

## 2023-08-09 DIAGNOSIS — Z951 Presence of aortocoronary bypass graft: Secondary | ICD-10-CM | POA: Diagnosis not present

## 2023-08-09 DIAGNOSIS — E1121 Type 2 diabetes mellitus with diabetic nephropathy: Secondary | ICD-10-CM | POA: Diagnosis not present

## 2023-08-09 DIAGNOSIS — Z0000 Encounter for general adult medical examination without abnormal findings: Secondary | ICD-10-CM | POA: Diagnosis not present

## 2023-08-09 DIAGNOSIS — Z9181 History of falling: Secondary | ICD-10-CM | POA: Diagnosis not present

## 2023-08-09 DIAGNOSIS — Z23 Encounter for immunization: Secondary | ICD-10-CM | POA: Diagnosis not present

## 2023-08-09 DIAGNOSIS — E039 Hypothyroidism, unspecified: Secondary | ICD-10-CM | POA: Diagnosis not present

## 2023-08-09 NOTE — Telephone Encounter (Signed)
Called patient to see if she is interested in the Cardiac Rehab Program. Patient expressed interest. Explained scheduling process and went over insurance, patient verbalized understanding. Someone from our cardiac rehab staff will contact pt at a later time.

## 2023-08-09 NOTE — Telephone Encounter (Signed)
Pt insurance is active and benefits verified through [facility] Medicare Co-pay $0, DED $0/$0 met, out of pocket $3600/$1071.61 met, co-insurance 0%. no pre-authorization required. Passport, 08/09/2023@805 , REF# [id number]   How many CR sessions are covered? 72  Is this a lifetime maximum or an annual maximum? annual Has the member used any of these services to date? no Is there a time limit (weeks/months) on start of program and/or program completion? no   Will contact patient to see if she is interested in the Cardiac Rehab Program. If interested, patient will need to complete follow up appt. Once completed, patient will be contacted for scheduling upon review by the RN Navigator.

## 2023-08-12 ENCOUNTER — Observation Stay (HOSPITAL_COMMUNITY)
Admission: EM | Admit: 2023-08-12 | Discharge: 2023-08-13 | Disposition: A | Discharge status: Home / Self Care | Payer: Medicare Other | Attending: Cardiology | Admitting: Cardiology

## 2023-08-12 ENCOUNTER — Other Ambulatory Visit: Payer: Self-pay

## 2023-08-12 ENCOUNTER — Emergency Department (HOSPITAL_COMMUNITY): Payer: Medicare Other

## 2023-08-12 ENCOUNTER — Encounter (HOSPITAL_COMMUNITY): Payer: Self-pay

## 2023-08-12 DIAGNOSIS — I509 Heart failure, unspecified: Secondary | ICD-10-CM | POA: Diagnosis not present

## 2023-08-12 DIAGNOSIS — Z951 Presence of aortocoronary bypass graft: Secondary | ICD-10-CM

## 2023-08-12 DIAGNOSIS — Z79899 Other long term (current) drug therapy: Secondary | ICD-10-CM | POA: Diagnosis not present

## 2023-08-12 DIAGNOSIS — Z794 Long term (current) use of insulin: Secondary | ICD-10-CM | POA: Insufficient documentation

## 2023-08-12 DIAGNOSIS — I5031 Acute diastolic (congestive) heart failure: Secondary | ICD-10-CM | POA: Diagnosis present

## 2023-08-12 DIAGNOSIS — N390 Urinary tract infection, site not specified: Secondary | ICD-10-CM | POA: Diagnosis present

## 2023-08-12 DIAGNOSIS — H409 Unspecified glaucoma: Secondary | ICD-10-CM | POA: Diagnosis not present

## 2023-08-12 DIAGNOSIS — Z7901 Long term (current) use of anticoagulants: Secondary | ICD-10-CM | POA: Diagnosis not present

## 2023-08-12 DIAGNOSIS — E118 Type 2 diabetes mellitus with unspecified complications: Secondary | ICD-10-CM | POA: Diagnosis present

## 2023-08-12 DIAGNOSIS — I10 Essential (primary) hypertension: Secondary | ICD-10-CM | POA: Diagnosis present

## 2023-08-12 DIAGNOSIS — I25118 Atherosclerotic heart disease of native coronary artery with other forms of angina pectoris: Secondary | ICD-10-CM | POA: Diagnosis present

## 2023-08-12 DIAGNOSIS — E782 Mixed hyperlipidemia: Secondary | ICD-10-CM | POA: Diagnosis not present

## 2023-08-12 DIAGNOSIS — F1090 Alcohol use, unspecified, uncomplicated: Secondary | ICD-10-CM | POA: Insufficient documentation

## 2023-08-12 DIAGNOSIS — E039 Hypothyroidism, unspecified: Secondary | ICD-10-CM | POA: Diagnosis not present

## 2023-08-12 DIAGNOSIS — E119 Type 2 diabetes mellitus without complications: Secondary | ICD-10-CM | POA: Diagnosis not present

## 2023-08-12 DIAGNOSIS — R0602 Shortness of breath: Secondary | ICD-10-CM | POA: Diagnosis not present

## 2023-08-12 DIAGNOSIS — I110 Hypertensive heart disease with heart failure: Principal | ICD-10-CM | POA: Insufficient documentation

## 2023-08-12 DIAGNOSIS — J90 Pleural effusion, not elsewhere classified: Secondary | ICD-10-CM | POA: Diagnosis not present

## 2023-08-12 LAB — CBC
HCT: 34.3 % — ABNORMAL LOW (ref 36.0–46.0)
Hemoglobin: 11 g/dL — ABNORMAL LOW (ref 12.0–15.0)
MCH: 30.1 pg (ref 26.0–34.0)
MCHC: 32.1 g/dL (ref 30.0–36.0)
MCV: 93.7 fL (ref 80.0–100.0)
Platelets: 414 10*3/uL — ABNORMAL HIGH (ref 150–400)
RBC: 3.66 MIL/uL — ABNORMAL LOW (ref 3.87–5.11)
RDW: 14.6 % (ref 11.5–15.5)
WBC: 9 10*3/uL (ref 4.0–10.5)
nRBC: 0 % (ref 0.0–0.2)

## 2023-08-12 LAB — BASIC METABOLIC PANEL
Anion gap: 11 (ref 5–15)
BUN: 22 mg/dL (ref 8–23)
CO2: 27 mmol/L (ref 22–32)
Calcium: 9.2 mg/dL (ref 8.9–10.3)
Chloride: 98 mmol/L (ref 98–111)
Creatinine, Ser: 1.1 mg/dL — ABNORMAL HIGH (ref 0.44–1.00)
GFR, Estimated: 50 mL/min — ABNORMAL LOW (ref >60)
Glucose, Bld: 190 mg/dL — ABNORMAL HIGH (ref 70–99)
Potassium: 3.1 mmol/L — ABNORMAL LOW (ref 3.5–5.1)
Sodium: 136 mmol/L (ref 135–145)

## 2023-08-12 LAB — TROPONIN I (HIGH SENSITIVITY)
Troponin I (High Sensitivity): 19 ng/L — ABNORMAL HIGH (ref <18)
Troponin I (High Sensitivity): 18 ng/L — ABNORMAL HIGH (ref <18)

## 2023-08-12 LAB — BRAIN NATRIURETIC PEPTIDE: B Natriuretic Peptide: 401.3 pg/mL — ABNORMAL HIGH (ref 0.0–100.0)

## 2023-08-12 MED ORDER — HYDRALAZINE HCL 20 MG/ML IJ SOLN
10.0000 mg | INTRAMUSCULAR | Status: AC
  Filled 2023-08-12: qty 1

## 2023-08-12 MED ORDER — LABETALOL HCL 5 MG/ML IV SOLN
20.0000 mg | Freq: Once | INTRAVENOUS | Status: DC
  Filled 2023-08-12: qty 4

## 2023-08-12 MED ORDER — POLYETHYLENE GLYCOL 3350 17 G PO PACK
17.0000 g | PACK | Freq: Every day | ORAL | Status: DC | PRN
  Administered 2023-08-12: 17 g via ORAL
  Filled 2023-08-12: qty 1

## 2023-08-12 MED ORDER — AMIODARONE HCL 200 MG PO TABS
200.0000 mg | ORAL_TABLET | Freq: Every day | ORAL | Status: DC
  Administered 2023-08-12 – 2023-08-13 (×2): 200 mg via ORAL
  Filled 2023-08-12 (×2): qty 1

## 2023-08-12 MED ORDER — NITROGLYCERIN 0.4 MG SL SUBL: 0.4000 mg | SUBLINGUAL_TABLET | SUBLINGUAL | Status: DC | PRN

## 2023-08-12 MED ORDER — ASPIRIN 325 MG PO TBEC
325.0000 mg | DELAYED_RELEASE_TABLET | Freq: Every day | ORAL | Status: DC
  Administered 2023-08-12 – 2023-08-13 (×2): 325 mg via ORAL
  Filled 2023-08-12 (×2): qty 1

## 2023-08-12 MED ORDER — METOPROLOL TARTRATE 25 MG PO TABS
25.0000 mg | ORAL_TABLET | Freq: Two times a day (BID) | ORAL | Status: DC
  Administered 2023-08-12 – 2023-08-13 (×3): 25 mg via ORAL
  Filled 2023-08-12 (×3): qty 1

## 2023-08-12 MED ORDER — FUROSEMIDE 10 MG/ML IJ SOLN
40.0000 mg | Freq: Once | INTRAMUSCULAR | Status: AC
  Administered 2023-08-12: 40 mg via INTRAVENOUS
  Filled 2023-08-12: qty 4

## 2023-08-12 MED ORDER — LEVOTHYROXINE SODIUM 75 MCG PO TABS
75.0000 ug | ORAL_TABLET | Freq: Every day | ORAL | Status: DC
  Administered 2023-08-13: 75 ug via ORAL
  Filled 2023-08-12: qty 1

## 2023-08-12 MED ORDER — NITROFURANTOIN MONOHYD MACRO 100 MG PO CAPS
100.0000 mg | ORAL_CAPSULE | Freq: Two times a day (BID) | ORAL | Status: AC
Start: 2023-08-12 — End: 2023-08-12
  Administered 2023-08-12: 100 mg via ORAL
  Filled 2023-08-12: qty 1

## 2023-08-12 MED ORDER — FUROSEMIDE 10 MG/ML IJ SOLN
40.0000 mg | Freq: Two times a day (BID) | INTRAMUSCULAR | Status: DC
  Administered 2023-08-12 – 2023-08-13 (×2): 40 mg via INTRAVENOUS
  Filled 2023-08-12 (×2): qty 4

## 2023-08-12 MED ORDER — AMLODIPINE BESYLATE 5 MG PO TABS
5.0000 mg | ORAL_TABLET | Freq: Every day | ORAL | Status: DC
  Administered 2023-08-12 – 2023-08-13 (×2): 5 mg via ORAL
  Filled 2023-08-12 (×2): qty 1

## 2023-08-12 MED ORDER — DORZOLAMIDE HCL-TIMOLOL MAL 2-0.5 % OP SOLN
1.0000 [drp] | Freq: Two times a day (BID) | OPHTHALMIC | Status: DC
  Filled 2023-08-12: qty 10

## 2023-08-12 MED ORDER — LATANOPROSTENE BUNOD 0.024 % OP SOLN: 1.0000 [drp] | Freq: Every day | OPHTHALMIC | Status: DC

## 2023-08-12 MED ORDER — POTASSIUM CHLORIDE CRYS ER 20 MEQ PO TBCR
40.0000 meq | EXTENDED_RELEASE_TABLET | Freq: Once | ORAL | Status: AC
  Administered 2023-08-12: 40 meq via ORAL
  Filled 2023-08-12: qty 2

## 2023-08-12 MED ORDER — OXYCODONE HCL 5 MG PO TABS
2.5000 mg | ORAL_TABLET | Freq: Two times a day (BID) | ORAL | Status: DC | PRN
  Administered 2023-08-13: 2.5 mg via ORAL
  Filled 2023-08-12: qty 1

## 2023-08-12 MED ORDER — PRAVASTATIN SODIUM 40 MG PO TABS
20.0000 mg | ORAL_TABLET | ORAL | Status: DC
  Filled 2023-08-12: qty 1

## 2023-08-12 MED ORDER — ACETAMINOPHEN 325 MG PO TABS: 650.0000 mg | ORAL_TABLET | ORAL | Status: DC | PRN

## 2023-08-12 MED ORDER — ENOXAPARIN SODIUM 40 MG/0.4ML IJ SOSY
40.0000 mg | PREFILLED_SYRINGE | INTRAMUSCULAR | Status: DC
  Administered 2023-08-12: 40 mg via SUBCUTANEOUS
  Filled 2023-08-12: qty 0.4

## 2023-08-12 MED ORDER — ONDANSETRON HCL 4 MG/2ML IJ SOLN: 4.0000 mg | Freq: Four times a day (QID) | INTRAMUSCULAR | Status: DC | PRN

## 2023-08-12 MED ORDER — SPIRONOLACTONE 12.5 MG HALF TABLET
12.5000 mg | ORAL_TABLET | Freq: Every day | ORAL | Status: DC
  Administered 2023-08-12 – 2023-08-13 (×2): 12.5 mg via ORAL
  Filled 2023-08-12 (×2): qty 1

## 2023-08-12 NOTE — ED Notes (Signed)
ED TO INPATIENT HANDOFF REPORT  ED Nurse Name and Phone #: Gustavo Lah, rn [phone]  S Name/Age/Gender Donna Guerrero 82 y.o. female Room/Bed: 011C/011C  Code Status   Code Status: Full Code  Home/SNF/Other Home Patient oriented to: self, place, time, and situation Is this baseline? Yes   Triage Complete: Triage complete  Chief Complaint Acute diastolic (congestive) heart failure (HCC) [I50.31]  Triage Note Pt arrived from home via POV c/o HTN s/p triple bypass on 07/29/2023. Pt states that she called the cardiologist and was told to come to ED immediately. Pt noted to have +2 pitting edema in BLE. B/P in triage 189/117   Allergies Allergies  Allergen Reactions   Welchol [Colesevelam Hcl] Other (See Comments)    Tablets were too big, CAUSED FLU-LIKE SYMPTOMS   Amaryl [Glimepiride] Other (See Comments)    headache   Atorvastatin     MYALGIAS   Codeine Nausea Only   Crestor [Rosuvastatin]     Myalgias   Empagliflozin Other (See Comments)    **Jardiance** leg pain   Ezetimibe Nausea Only    indigestion   Metformin Hcl Other (See Comments)    Unknown reaction   Simvastatin     Myalgia     Level of Care/Admitting Diagnosis ED Disposition     ED Disposition  Admit   Condition  --   Comment  Hospital [facility] [[id number]]  Level of Care: Telemetry Cardiac [[id number]]  May place patient in observation at [facility] if equivalent level of care is available:: No  Covid Evaluation: Asymptomatic - no recent exposure (last 10 days) testing not required  Diagnosis: Acute diastolic (congestive) heart failure [facility]) [[id number]]  Admitting Physician: Lewayne Bunting [[id number]]  Attending Physician: Lewayne Bunting [[id number]]          B Medical/Surgery History Past Medical History:  Diagnosis Date   Allergic rhinitis    Arthritis    Coronary artery disease    Diabetes mellitus without complication (HCC)    TYPE II   Essential tremor     Glaucoma 11/2008   Hyperlipidemia    INTOLERANT OF STATINS   Hypertension    Hypothyroidism    Rosacea    Whooping cough    Past Surgical History:  Procedure Laterality Date   CATARACT EXTRACTION Bilateral    CORONARY ARTERY BYPASS GRAFT N/A 07/29/2023   Procedure: CORONARY ARTERY BYPASS GRAFTING X 3 USING LEFT INTERNAL MAMMARY ARTERY AND RIGHT GREAT SAPHENOUS VEIN, HARVESTED ENDOSCOPICALLY;  Surgeon: Corliss Skains, MD;  Location: [facility] OR;  Service: Open Heart Surgery;  Laterality: N/A;   EYE SURGERY     Flap added to facilitate fuild removal   LEFT HEART CATH AND CORONARY ANGIOGRAPHY N/A 07/07/2023   Procedure: LEFT HEART CATH AND CORONARY ANGIOGRAPHY;  Surgeon: Swazil[place], Peter M, MD;  Location: [facility] INVASIVE CV LAB;  Service: Cardiovascular;  Laterality: N/A;   RETINAL DETACHMENT SURGERY     [facility]   RETINAL DETACHMENT SURGERY     [facility]   TEE WITHOUT CARDIOVERSION N/A 07/29/2023   Procedure: TRANSESOPHAGEAL ECHOCARDIOGRAM;  Surgeon: Corliss Skains, MD;  Location: [facility] OR;  Service: Open Heart Surgery;  Laterality: N/A;     A IV Location/Drains/Wounds Patient Lines/Drains/Airways Status     Active Line/Drains/Airways     Name Placement date Placement time Site Days   Peripheral IV 08/12/23 20 G Anterior;Left;Proximal Forearm 08/12/23  0525  Forearm  less than 1  Intake/Output Last 24 hours No intake or output data in the 24 hours ending 08/12/23 1459  Labs/Imaging Results for orders placed or performed during the hospital encounter of 08/12/23 (from the past 48 hour(s))  Basic metabolic panel     Status: Abnormal   Collection Time: 08/12/23  5:26 AM  Result Value Ref Range   Sodium 136 135 - 145 mmol/L   Potassium 3.1 (L) 3.5 - 5.1 mmol/L   Chloride 98 98 - 111 mmol/L   CO2 27 22 - 32 mmol/L   Glucose, Bld 190 (H) 70 - 99 mg/dL    Comment: Glucose reference range applies only to samples taken after fasting for at least 8  hours.   BUN 22 8 - 23 mg/dL   Creatinine, Ser 4.09 (H) 0.44 - 1.00 mg/dL   Calcium 9.2 8.9 - 81.1 mg/dL   GFR, Estimated 50 (L) >60 mL/min    Comment: (NOTE) Calculated using the CKD-EPI Creatinine Equation (2021)    Anion gap 11 5 - 15    Comment: Performed at [facility], [address] [place], [place] [address]  CBC     Status: Abnormal   Collection Time: 08/12/23  5:26 AM  Result Value Ref Range   WBC 9.0 4.0 - 10.5 K/uL   RBC 3.66 (L) 3.87 - 5.11 MIL/uL   Hemoglobin 11.0 (L) 12.0 - 15.0 g/dL   HCT 29.5 (L) 62.1 - 30.8 %   MCV 93.7 80.0 - 100.0 fL   MCH 30.1 26.0 - 34.0 pg   MCHC 32.1 30.0 - 36.0 g/dL   RDW 65.7 84.6 - 96.2 %   Platelets 414 (H) 150 - 400 K/uL   nRBC 0.0 0.0 - 0.2 %    Comment: Performed at [facility], [address] [place], [place] [address]  Troponin I (High Sensitivity)     Status: Abnormal   Collection Time: 08/12/23  5:26 AM  Result Value Ref Range   Troponin I (High Sensitivity) 19 (H) <18 ng/L    Comment: (NOTE) Elevated high sensitivity troponin I (hsTnI) values and significant  changes across serial measurements may suggest ACS but many other  chronic and acute conditions are known to elevate hsTnI results.  Refer to the "Links" section for chest pain algorithms and additional  guidance. Performed at [facility], [address], [place], [place] [address]   Brain natriuretic peptide     Status: Abnormal   Collection Time: 08/12/23  5:26 AM  Result Value Ref Range   B Natriuretic Peptide 401.3 (H) 0.0 - 100.0 pg/mL    Comment: Performed at [facility], [address], [place], [place] [address]  Troponin I (High Sensitivity)     Status: Abnormal   Collection Time: 08/12/23  7:27 AM  Result Value Ref Range   Troponin I (High Sensitivity) 18 (H) <18 ng/L    Comment: (NOTE) Elevated high sensitivity troponin I (hsTnI) values and significant  changes across serial measurements may suggest ACS but many other   chronic and acute conditions are known to elevate hsTnI results.  Refer to the "Links" section for chest pain algorithms and additional  guidance. Performed at [facility], [address] [place], [place] [address]    DG Chest 2 View  Result Date: 08/12/2023 CLINICAL DATA:  Shortness of breath. EXAM: CHEST - 2 VIEW COMPARISON:  07/31/2023 FINDINGS: Similar left base collapse/consolidation with small effusion. Right lung clear. Cardiopericardial silhouette  is at upper limits of normal for size. No acute bony abnormality. Telemetry leads overlie the chest. IMPRESSION: Similar left base collapse/consolidation with small effusion. Electronically Signed   By: Kennith Center M.D.   On: 08/12/2023 06:17    Pending Labs Unresulted Labs (From admission, onward)     Start     Ordered   08/19/23 0500  Creatinine, serum  (enoxaparin (LOVENOX)    CrCl >/= 30 ml/min)  Weekly,   R     Comments: while on enoxaparin therapy    08/12/23 1144   08/13/23 0500  Basic metabolic panel  Daily,   R      08/12/23 1144            Vitals/Pain Today's Vitals   08/12/23 0930 08/12/23 1000 08/12/23 1100 08/12/23 1236  BP: (!) 163/60 137/69 (!) 154/64 (!) 154/66  Pulse: 62 63 65 67  Resp: (!) 23 20 16 19   Temp:    (!) 97.4 F (36.3 C)  TempSrc:    Oral  SpO2: 98% 97% 97% 98%  Weight:      Height:      PainSc:        Isolation Precautions No active isolations  Medications Medications  hydrALAZINE (APRESOLINE) injection 10 mg (0 mg Intravenous Hold 08/12/23 0636)  aspirin EC tablet 325 mg (325 mg Oral Given 08/12/23 1227)  oxyCODONE (Oxy IR/ROXICODONE) immediate release tablet 2.5 mg (has no administration in time range)  amiodarone (PACERONE) tablet 200 mg (200 mg Oral Given 08/12/23 1227)  pravastatin (PRAVACHOL) tablet 20 mg (has no administration in time range)  metoprolol tartrate (LOPRESSOR) tablet 25 mg (25 mg Oral Given 08/12/23 1228)  levothyroxine (SYNTHROID) tablet 75 mcg (has no  administration in time range)  dorzolamide-timolol (COSOPT) 2-0.5 % ophthalmic solution 1 drop (has no administration in time range)  Latanoprostene Bunod 0.024 % SOLN 1 drop (has no administration in time range)  nitroGLYCERIN (NITROSTAT) SL tablet 0.4 mg (has no administration in time range)  acetaminophen (TYLENOL) tablet 650 mg (has no administration in time range)  ondansetron (ZOFRAN) injection 4 mg (has no administration in time range)  enoxaparin (LOVENOX) injection 40 mg (has no administration in time range)  spironolactone (ALDACTONE) tablet 12.5 mg (12.5 mg Oral Given 08/12/23 1228)  amLODipine (NORVASC) tablet 5 mg (5 mg Oral Given 08/12/23 1228)  furosemide (LASIX) injection 40 mg (has no administration in time range)  potassium chloride SA (KLOR-CON M) CR tablet 40 mEq (40 mEq Oral Given 08/12/23 0637)  furosemide (LASIX) injection 40 mg (40 mg Intravenous Given 08/12/23 0905)  nitrofurantoin (macrocrystal-monohydrate) (MACROBID) capsule 100 mg (100 mg Oral Given 08/12/23 1230)    Mobility walks with device     Focused Assessments Cardiac Assessment Handoff:    No results found for: "CKTOTAL", "CKMB", "CKMBINDEX", "TROPONINI" No results found for: "DDIMER" Does the Patient currently have chest pain? No   , Pulmonary Assessment Handoff:  Lung sounds:   O2 Device: Room Air      R Recommendations: See Admitting Provider Note  Report given to:   Additional Notes:

## 2023-08-12 NOTE — ED Notes (Signed)
Pt states that she also has a tightness across her abdomen and feels like she is unable to take a deep breath

## 2023-08-12 NOTE — ED Notes (Signed)
Pt taken to xray 

## 2023-08-12 NOTE — ED Triage Notes (Signed)
Pt arrived from home via POV c/o HTN s/p triple bypass on 07/29/2023. Pt states that she called the cardiologist and was told to come to ED immediately. Pt noted to have +2 pitting edema in BLE. B/P in triage 189/117

## 2023-08-12 NOTE — ED Provider Notes (Signed)
[facility]-EMERGENCY DEPT [facility] Emergency Department Provider Note MRN:  [id number]  Arrival date & time: 08/12/23     Chief Complaint   Hypertension   History of Present Illness   Donna Guerrero is a 82 y.o. year-old female presents to the ED with chief complaint of leg swelling and SOB.  Had triple bypass surgery 2 weeks ago.  Has been taking lasix at home, but continues to have leg swelling.  She reports some associated SOB.  She states that she measured her BP and found it to be high and was told to come to the ER by her doctor.  She denies any chest pain.  Denies fever or chills.  States she has been compliant in taking her medications.  History provided by patient.   Review of Systems  Pertinent positive and negative review of systems noted in HPI.    Physical Exam   Vitals:   08/12/23 0539 08/12/23 0615  BP: (!) 164/65 (!) 169/67  Pulse: (!) 59 61  Resp: 17 18  Temp:    SpO2: 97% 97%    CONSTITUTIONAL:  non toxic-appearing, NAD NEURO:  Alert and oriented x 3, CN 3-12 grossly intact EYES:  eyes equal and reactive ENT/NECK:  Supple, no stridor  CARDIO:  normal rate, regular rhythm, appears well-perfused  PULM:  No respiratory distress, CTAB GI/GU:  non-distended, non-tender MSK/SPINE:  No gross deformities, trace lower extremity pitting edema SKIN:  no rash, atraumatic   *Additional and/or pertinent findings included in MDM below  Diagnostic and Interventional Summary    EKG Interpretation Date/Time:  Thursday August 12 2023 05:17:51 EST Ventricular Rate:  58 PR Interval:  216 QRS Duration:  104 QT Interval:  456 QTC Calculation: 448 R Axis:   -3  Text Interpretation: Sinus rhythm Borderline prolonged PR interval Anteroseptal infarct, age indeterminate Confirmed by Kennis Carina [phone]) on 08/12/2023 5:40:27 AM       Labs Reviewed  BASIC METABOLIC PANEL - Abnormal; Notable for the following components:      Result Value   Potassium 3.1 (*)     Glucose, Bld 190 (*)    Creatinine, Ser 1.10 (*)    GFR, Estimated 50 (*)    All other components within normal limits  CBC - Abnormal; Notable for the following components:   RBC 3.66 (*)    Hemoglobin 11.0 (*)    HCT 34.3 (*)    Platelets 414 (*)    All other components within normal limits  BRAIN NATRIURETIC PEPTIDE - Abnormal; Notable for the following components:   B Natriuretic Peptide 401.3 (*)    All other components within normal limits  TROPONIN I (HIGH SENSITIVITY) - Abnormal; Notable for the following components:   Troponin I (High Sensitivity) 19 (*)    All other components within normal limits    DG Chest 2 View  Final Result      Medications  hydrALAZINE (APRESOLINE) injection 10 mg (0 mg Intravenous Hold 08/12/23 0636)  potassium chloride SA (KLOR-CON M) CR tablet 40 mEq (40 mEq Oral Given 08/12/23 8295)     Procedures  /  Critical Care Procedures  ED Course and Medical Decision Making  I have reviewed the triage vital signs, the nursing notes, and pertinent available records from the EMR.  Social Determinants Affecting Complexity of Care: Patient has no clinically significant social determinants affecting this chief complaint..   ED Course:    Medical Decision Making Amount and/or Complexity of Data Reviewed Labs: ordered.  Radiology: ordered.  Risk Prescription drug management.         Consultants:    Treatment and Plan:  Patient signed out to oncoming team.    Plan: Follow-up on labs and imaging.  Repeat trop Cardiology consultation    Final Clinical Impressions(s) / ED Diagnoses     ICD-10-CM   1. SOB (shortness of breath)  R06.02       ED Discharge Orders     None         Discharge Instructions Discussed with and Provided to Patient:   Discharge Instructions   None      Roxy Horseman, PA-C 08/12/23 8469    Sabas Sous, MD 08/12/23 [phone]

## 2023-08-12 NOTE — ED Provider Notes (Signed)
Care assumed from PA Rob Grayson at shift change, please see his note for full details, but in brief, Donna Guerrero is a 82 y.o. female who presents for shortness of breath, 2 weeks SP CABG.  Taking Lasix at home but continues to have leg swelling.  Found to have elevated blood pressure.  Patient appears fluid overloaded, troponin mildly elevated at 19, mild hypokalemia.  Cardiology consult pending at shift change.  BP (!) 169/67   Pulse 61   Temp 97.9 F (36.6 C) (Oral)   Resp 18   Ht 5' (1.524 m)   Wt 63.8 kg   SpO2 97%   BMI 27.46 kg/m   Procedures  Procedures  ED Course / MDM   Labs Reviewed  BASIC METABOLIC PANEL - Abnormal; Notable for the following components:      Result Value   Potassium 3.1 (*)    Glucose, Bld 190 (*)    Creatinine, Ser 1.10 (*)    GFR, Estimated 50 (*)    All other components within normal limits  CBC - Abnormal; Notable for the following components:   RBC 3.66 (*)    Hemoglobin 11.0 (*)    HCT 34.3 (*)    Platelets 414 (*)    All other components within normal limits  BRAIN NATRIURETIC PEPTIDE - Abnormal; Notable for the following components:   B Natriuretic Peptide 401.3 (*)    All other components within normal limits  TROPONIN I (HIGH SENSITIVITY) - Abnormal; Notable for the following components:   Troponin I (High Sensitivity) 19 (*)    All other components within normal limits  TROPONIN I (HIGH SENSITIVITY)   DG Chest 2 View  Result Date: 08/12/2023 CLINICAL DATA:  Shortness of breath. EXAM: CHEST - 2 VIEW COMPARISON:  07/31/2023 FINDINGS: Similar left base collapse/consolidation with small effusion. Right lung clear. Cardiopericardial silhouette is at upper limits of normal for size. No acute bony abnormality. Telemetry leads overlie the chest. IMPRESSION: Similar left base collapse/consolidation with small effusion. Electronically Signed   By: Kennith Center M.D.   On: 08/12/2023 06:17    Medical Decision Making Amount and/or  Complexity of Data Reviewed Labs: ordered. Radiology: ordered.  Risk Prescription drug management. Decision regarding hospitalization.   Cardiology team has seen and evaluated patient, will plan to admit to cardiology service for post-CABG fluid overload and continued diuresis and close monitoring of renal function.       Dartha Lodge, PA-C 08/12/23 0941    Rexford Maus, DO 08/12/23 [phone]

## 2023-08-12 NOTE — H&P (Addendum)
Cardiology Consultation   Patient ID: Donna Guerrero MRN: [id number]; DOB: 04-Jul-1941  Admit date: 08/12/2023 Date of Consult: 08/12/2023  PCP:  Donna Ates, MD   [facility] Providers Cardiologist:  Donna Constant, MD        Patient Profile:   Donna Guerrero is a 82 y.o. female with a history of CAD s/p recent CABG x3 (LIMA-LAD, SVG-OM, SVG-PDA) on 07/29/2023, post-op atrial fibrillation on Amiodarone but no anticoagulation, hypertension, hyperlipidemia, type 2 diabetes mellitus, hypothyroidism, essential tremor, glaucoma, and rosacea who is being seen 08/12/2023 for the evaluation of shortness of breath at the request of Donna Guerrero.Marland Kitchen  History of Present Illness:   Donna Guerrero is a 82 year old female with the above history who i was previously followed by Donna Guerrero and now follows with Donna Guerrero.  She was recently seen by Donna Guerrero in the office in 06/2023  at which time she reported some chest tightness with exertion such as walking up a steep hill.  Coronary CTA was recommended for further evaluation and showed coronary calcium score of 899 (88th percentile for age and sex) and severe ostial LAD stenosis with concern for severe lesions in the proximal LCx, 1st Diag, and distal RCA/proximal PLA.  She then underwent a left cardiac catheterization on 07/07/2023 which showed severe three-vessel obstructive CAD.  She was referred to CT surgery and CABG was recommended.  Echo prior to surgery showed of EF of 55-60% with normal wall motion and grade 1 diastolic dysfunction, normal RV size and function, and mild dilatation of the ascending aorta measuring 39 mm.  She presented to [facility] on 10 03/25/2019 for and underwent CABG x 3 with LIMA to LAD, SVG to OM, and SVG to PDA with Donna [facility]ro.  She did develop postop atrial fibrillation and was placed on Amiodarone with conver back to sinus rhythm.  Not started on a DOAC given her age.`  She also had  expected postop volume overload and was diuresed.  She was discharged on Amiodarone 200 mg twice daily x 5 days followed by 200 mg daily, Aspirin 325 mg daily, Lasix 40 mg x 7 days (with KCl 20 mEq daily), and Lopressor 25 mg twice daily.  Her home Amlodipine, Chlorthalidone, Lisinopril, and Propranolol were all stopped.  Was discharged on 08/03/2023.  Patient presented to the ED earlier this morning evaluation of lower extremity edema and shortness of breath.  Upon arrival to the ED, patient markedly hypertensive at 189/117. EKG showed sinus bradycardia, rate 58 beats minute, with first-degree AV block and nonspecific ST/T wave changes.  Initial troponin minimally elevated at 19.  BNP elevated at 401.3.  Chest x-ray showed similar left base collapse/consolidation with small effusion. WBC 9.0, Hgb 11.0, Plts 414. Na 136, K 3.1, Glucose 190, Cr. 1.10, BUN 22. Potasium was repleted and Cardiology consulted for further evaluation.  At the time of this evaluation, patient is resting comfortably no acute distress.  She states she was doing well for the first couple of days after leaving the [facility] and was feeling great.  However, he started to notice some swelling in her feet.  She states this started around 08/08/2023.  She saw her PCP on 08/09/2023 who recommended continuing her Lasix for longer.  However, continued to have swelling and it started to extend up to her legs not just her feet.  She called her PCP yesterday who advised her that she could take it twice daily. She also  reports some abdominal distension and about a 10lb weight gain since leaving the Guerrero.  She denies any shortness of breath until last night.  She states she woke up to use the bathroom and then after laying back in bed she just felt like she was not breathing right and could not take a deep breath.  This is when she called the after-hours Cardiology line who advised her to come to the ED.  She states she has been sleeping on an incline  since her surgery due to comfort but denies any nocturnal dyspnea other than last night.  No orthopnea Guerrero PND.  No dyspnea on exertion.   She reports some chest wall pain and "twinges" following surgery but nothing that sounds like angina.  No chest tightness similar to what she was having prior to her surgery. No palpitations, lightheadedness, dizziness, Guerrero syncope. She denies any recent fevers, cough, nasal congestion. She reports some nausea after taking the Oxycodone so she has only been taking 1/2 tablet twice a day. No abnormal bleeding in urine Guerrero stools. She does report some constipation following surgery. She also states she was diagnosed with a UTI on 08/07/2023 and was started on a 5 day course of Macrobid.   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   Hyperlipidemia    INTOLERANT OF STATINS   Hypertension    Hypothyroidism    Rosacea    Whooping cough     Past Surgical History:  Procedure Laterality Date   CATARACT EXTRACTION Bilateral    CORONARY ARTERY BYPASS GRAFT N/A 07/29/2023   Procedure: CORONARY ARTERY BYPASS GRAFTING X 3 USING LEFT INTERNAL MAMMARY ARTERY AND RIGHT GREAT SAPHENOUS VEIN, HARVESTED ENDOSCOPICALLY;  Surgeon: Donna Skains, MD;  Location: [facility];  Service: Open Heart Surgery;  Laterality: N/A;   EYE SURGERY     Flap added to facilitate fuild removal   LEFT HEART CATH AND CORONARY ANGIOGRAPHY N/A 07/07/2023   Procedure: LEFT HEART CATH AND CORONARY ANGIOGRAPHY;  Surgeon: Guerrero, Donna M, MD;  Location: [facility];  Service: Cardiovascular;  Laterality: N/A;   RETINAL DETACHMENT SURGERY     Donna Guerrero   RETINAL DETACHMENT SURGERY     Donna [facility]   TEE WITHOUT CARDIOVERSION N/A 07/29/2023   Procedure: TRANSESOPHAGEAL ECHOCARDIOGRAM;  Surgeon: Donna Skains, MD;   [facility]: [facility];  Service: Open Heart Surgery;  Laterality: N/A;     Home Medications:  Prior to Admission medications   Medication Sig Start Date End Date Taking? Authorizing Provider  acetaminophen (TYLENOL) 500 MG tablet Take 500 mg by mouth every 6 (six) hours as needed for moderate pain.   Yes [provider]  amiodarone (PACERONE) 200 MG tablet Take 1 tablet twice per day for 5 days, then take 1 tablet daily thereafter Patient taking differently: Take 200 mg by mouth daily. 08/03/23  Yes Stehler, Oren Bracket, PA-C  aspirin EC 325 MG tablet Take 1 tablet (325 mg total) by mouth daily. 08/03/23  Yes Stehler, Oren Bracket, PA-C  dorzolamide-timolol (COSOPT) 2-0.5 % ophthalmic solution Place 1 drop into both eyes 2 (two) times daily. 05/31/23  Yes [provider]  furosemide (LASIX) 40 MG tablet Take 1 tablet (40 mg total) by mouth daily. Take for 7 days then stop  08/03/23  Yes Stehler, Oren Bracket, PA-C  levothyroxine (SYNTHROID) 75 MCG tablet Take 75 mcg by mouth daily before breakfast. 04/18/20  Yes [provider]  LIVALO 1 MG TABS Take 1 mg by mouth every Monday, Wednesday, and Friday. 04/19/23  Yes [provider]  metoprolol tartrate (LOPRESSOR) 25 MG tablet Take 1 tablet (25 mg total) by mouth 2 (two) times daily. 08/03/23  Yes Stehler, Oren Bracket, PA-C  nitrofurantoin, macrocrystal-monohydrate, (MACROBID) 100 MG capsule Take 100 mg by mouth 2 (two) times daily. For 5 days   Yes [provider]  oxyCODONE (OXY IR/ROXICODONE) 5 MG immediate release tablet Take 1 tablet (5 mg total) by mouth every 6 (six) hours as needed for severe pain (pain score 7-10). Patient taking differently: Take 2.5 mg by mouth 2 (two) times daily as needed for severe pain (pain score 7-10). 08/03/23  Yes Stehler, Oren Bracket, PA-C  potassium chloride (MICRO-K) 10 MEQ CR capsule Take 20 mEq by mouth daily. 08/04/23  Yes [provider]  VYZULTA 0.024 % SOLN Place 1 drop into  both eyes at bedtime. 04/19/20  Yes [provider]  glucose blood test strip 1 each by Other route as needed for other. Use as instructed    [provider]    Inpatient Medications: Scheduled Meds:  hydrALAZINE  10 mg Intravenous STAT   Continuous Infusions:  PRN Meds:   Allergies:    Allergies  Allergen Reactions   Welchol [Colesevelam Hcl] Other (See Comments)    Tablets were too big, CAUSED FLU-LIKE SYMPTOMS   Amaryl [Glimepiride] Other (See Comments)    headache   Atorvastatin     MYALGIAS   Codeine Nausea Only   Crestor [Rosuvastatin]     Myalgias   Empagliflozin Other (See Comments)    **Jardiance** leg pain   Ezetimibe Nausea Only    indigestion   Metformin Hcl Other (See Comments)    Unknown reaction   Simvastatin     Myalgia     Social History:   Social History   Socioeconomic History   Marital status: Married    Spouse name: Not on file   Number of children: Not on file   Years of education: Not on file   Highest education level: Not on file  Occupational History   Not on file  Tobacco Use   Smoking status: Never   Smokeless tobacco: Never  Vaping Use   Vaping status: Never Used  Substance and Sexual Activity   Alcohol use: Yes    Alcohol/week: 2.0 standard drinks of alcohol    Types: 2 Glasses of wine per week   Drug use: No   Sexual activity: Not Currently  Other Topics Concern   Not on file  Social History Narrative   Not on file   Social Determinants of Health   Financial Resource Strain: Not on file  Food Insecurity: No Food Insecurity (08/01/2023)   Hunger Vital Sign    Worried About Running Out of Food in the Last Year: Never true    Ran Out of Food in the Last Year: Never true  Transportation Needs: No Transportation Needs (08/01/2023)   PRAPARE - Administrator, Civil Service (Medical): No    Lack of Transportation (Non-Medical): No  Physical Activity: Not on file  Stress: Not on file  Social  Connections: Not on file  Intimate Partner Violence: Not At Risk (08/01/2023)   Humiliation, Afraid, Rape, and Kick questionnaire    Fear  of Current [facility] Ex-Partner: No    Emotionally Abused: No    Physically Abused: No    Sexually Abused: No    Family History:   Family History  Problem Relation Age of Onset   Heart disease Mother        heart valve d/o   CAD Father    Heart attack Father        X2   COPD Brother    Hypertension Neg Hx    Stroke Neg Hx      ROS:  Please see the history of present illness.  All other ROS reviewed and negative.     Physical Exam/Data:   Vitals:   08/12/23 0645 08/12/23 0715 08/12/23 0835 08/12/23 0900  BP: (!) 155/95 (!) 148/69  (!) 160/59  Pulse: 62 61  64  Resp: 18 (!) 21  19  Temp:   98.2 F (36.8 C)   TempSrc:   Oral   SpO2: 98% 98%  98%  Weight:      Height:       No intake Gu[facility]ro output data in the 24 hours ending 08/12/23 0932    08/12/2023    4:51 AM 08/03/2023    6:18 AM 08/02/2023    5:37 AM  Last 3 Weights  Weight (lbs) 140 lb 9.6 oz 140 lb 9.6 oz 141 lb 15.6 oz  Weight (kg) 63.776 kg 63.776 kg 64.4 kg     Body mass index is 27.46 kg/m.  General: 82 y.o. Caucasian female resting comfortably in no acute distress. HEENT: Normocephalic and atraumatic. Sclera clear.  Neck: Supple. No JVD. Heart: Borderline bradycardic with normal rate. Distinct S1 and S2. No murmurs, gallops, Guerrero rubs.  Lungs: No increased work of breathing. Crackles noted in left lung base but otherwise clear to auscultation. No wheezes Guerrero rhonchi. Abdomen: Soft, distended, and non-tender. Extremities: 1+ pitting edema of bilateral lower extremities.   Skin: Warm and dry. Neuro: Alert and oriented x3. No focal deficits. Psych: Normal affect. Responds appropriately.   EKG:  The EKG was personally reviewed and demonstrates:  Sinus bradycardia, rate 58 beats minute, with first-degree AV block and nonspecific ST/T wave changes.  Telemetry:  Telemetry was  personally reviewed and demonstrates:  Sinus rhythm with rates in the 50s to 60s.  Relevant CV Studies:  Left Cardiac Catheterization 07/07/2023:   Ost LAD lesion is 85% stenosed.   Prox LAD lesion is 70% stenosed.   1st Diag lesion is 60% stenosed.   Mid LAD lesion is 50% stenosed.   1st Mrg lesion is 70% stenosed.   RPAV-1 lesion is 95% stenosed.   RPAV-2 lesion is 80% stenosed.   RPDA lesion is 70% stenosed.   The left ventricular systolic function is normal.   LV end diastolic pressure is normal.   The left ventricular ejection fraction is 55-65% by visual estimate.   Severe 3 vessel obstructive CAD Normal LV function Normal LVEDP   Plan: her disease is not amenable to PCI. Would consider CABG given high risk anatomy.  Diagnostic Dominance: Right  _______________  Echocardiogram 07/22/2023: Impressions: 1. Left ventricular ejection fraction, by estimation, is 55 to 60%. The  left ventricle has normal function. The left ventricle has no regional  wall motion abnormalities. Left ventricular diastolic parameters are  consistent with Grade I diastolic  dysfunction (impaired relaxation).   2. Right ventricular systolic function is normal. The right ventricular  size is normal. Tricuspid regurgitation signal is inadequate for assessing  PA  pressure.   3. The mitral valve is normal in structure. Trivial mitral valve  regurgitation. No evidence of mitral stenosis.   4. The aortic valve is tricuspid. There is moderate calcification of the  aortic valve. Aortic valve regurgitation is trivial. Aortic valve  sclerosis/calcification is present, without any evidence of aortic  stenosis. Aortic valve mean gradient measures  9.0 mmHg.   5. Aortic dilatation noted. There is mild dilatation of the ascending  aorta, measuring 39 mm.   6. The inferior vena cava is normal in size with greater than 50%  respiratory variability, suggesting right atrial pressure of 3 mmHg.    Laboratory  Data:  High Sensitivity Troponin:   Recent Labs  Lab 08/12/23 0526 08/12/23 0727  TROPONINIHS 19* 18*     Chemistry Recent Labs  Lab 08/12/23 0526  NA 136  K 3.1*  CL 98  CO2 27  GLUCOSE 190*  BUN 22  CREATININE 1.10*  CALCIUM 9.2  GFRNONAA 50*  ANIONGAP 11    No results for input(s): "PROT", "ALBUMIN", "AST", "ALT", "ALKPHOS", "BILITOT" in the last 168 hours. Lipids No results for input(s): "CHOL", "TRIG", "HDL", "LABVLDL", "LDLCALC", "CHOLHDL" in the last 168 hours.  Hematology Recent Labs  Lab 08/12/23 0526  WBC 9.0  RBC 3.66*  HGB 11.0*  HCT 34.3*  MCV 93.7  MCH 30.1  MCHC 32.1  RDW 14.6  PLT 414*   Thyroid No results for input(s): "TSH", "FREET4" in the last 168 hours.  BNP Recent Labs  Lab 08/12/23 0526  BNP 401.3*    DDimer No results for input(s): "DDIMER" in the last 168 hours.   Radiology/Studies:  DG Chest 2 View  Result Date: 08/12/2023 CLINICAL DATA:  Shortness of breath. EXAM: CHEST - 2 VIEW COMPARISON:  07/31/2023 FINDINGS: Similar left base collapse/consolidation with small effusion. Right lung clear. Cardiopericardial silhouette is at upper limits of normal for size. No acute bony abnormality. Telemetry leads overlie the chest. IMPRESSION: Similar left base collapse/consolidation with small effusion. Electronically Signed   By: Kennith Guerrero M.D.   On: 08/12/2023 06:17     Assessment and Plan:   Acute Diastolic CHF vs Post Op Volume Overload Patient recently underwent CABG on 07/29/2023. She had some expected post-op volume overload following this and was initially diuresed during that admission.  She was discharged on a 7-day course of PO Lasix.  However, she has had some progressive edema and weight gain and then last night started to have some shortness of breath. BNP elevated at 401.  Chest x-ray showed's a similar left base collapse/consolidation with small effusion that was seen on prior imaging.  Echo prior to CABG showed LVEF of  55-60% with grade 1 diastolic dysfunction. - She does look mildly volume overloaded on exam.  - Will start IV Lasix 40mg  twice daily.   CAD s/p CABG Patient recently underwent CABG x 3 with LIMA to LAD, SVG to OM, and SVG to PDA on 07/29/2023.  High-sensitivity troponin minimally elevated at 19 >> 18 not consistent with ACS.  EKG shows no cute ischemic changes. - She describes some chest wall pain after surgery but no angina. - Continue Aspirin. Currently on 325mg  twice daily.  - Continue beta-blocker and statin.  Post-Op Atrial Fibrillation Following recent CABG. She was started on Amiodarone and converted back to sinus rhythm. She was not started on DOAC due to advanced age.  - Maintaining sinus rhythm. - Continue Lopressor 25mg  twice daily.  - Continue Amiodarone 200mg  daily.  -  Not on anticoagulation as above.  Hypertension BP was markedly elevated on arrival in the 180s/ 110s.   - BP still elevated with systolic BP in the 140s to 170s.  - Continue Lopressor 25mg  twice daily.  - She was previously on Amlodipine, Chlorthalidone, and Lisinopril but these were stopped at recent discharge. Will restart Amlodipine 5mg  daily.  Hyperlipidemia Lipid panel on 07/31/2023: Total Cholesterol 113, Triglycerides 61, HDL 43, LDL 58. LDL goal <70 given CAD.  - Intolerant to high intensity statins. - Continue Livalo 1 mg M/W/F.  Hypokalemia Potassium 3.1 on arrival.  - She was given KCl 40 mEq in the ED. - Will give another dose of KCl 40 mEq.   UTI She was recently diagnosed with a UTI on 08/07/2023 and was started on Macrobid. - Daughter states she is due for her last dose of Macrobid today. Will order this.  Glaucoma - Continue home eye drops.   Risk Assessment/Risk Scores:    New York Heart Association (NYHA) Functional Class NYHA Class II  CHA2DS2-VASc Score = 6  his indicates a 9.7% annual risk of stroke. The patient's score is based upon: CHF History: 1 HTN History:  1 Diabetes History: 0 Stroke History: 0 Vascular Disease History: 1 Age Score: 2 Gender Score: 1   For questions [facility] updates, please contact [facility] Please consult [email or web address] for contact info under    Signed, Corrin Parker, PA-C  08/12/2023 9:32 AM As above, patient seen and examined.  Briefly she is an 82 year old female with past medical history of recent coronary artery bypass and graft on October 24, postoperative atrial fibrillation treated with amiodarone, hypertension, hyperlipidemia, diabetes mellitus, hypothyroidism with acute diastolic congestive heart failure.  Echocardiogram July 22, 2023 showed sinus rhythm with grade 1 diastolic dysfunction.  Patient was discharged October 29 following recent admission for coronary artery bypass and graft.  She has noticed that she had worsening bilateral lower extremity edema, increased weight and last evening developed dyspnea.  She denies chest pain, palpitations [facility] syncope.  Note her dyspnea/volume excess was despite Lasix at home.  Chest x-ray shows similar left base collapse/consolidation with small effusion.  Sodium 136, potassium 3.1, creatinine 1.10, BNP 401, hemoglobin 11, electrocardiogram shows sinus rhythm with first-degree AV block and nonspecific ST changes.  1 acute diastolic congestive heart failure-patient is volume overloaded on examination.  Will treat with Lasix 40 mg IV twice daily.  Add spironolactone 12.5 mg daily.  Follow renal function.  Likely component of postoperative volume excess.  Recent echocardiogram showed preserved LV function and grade 1 diastolic dysfunction.  2 hypertension-patient's blood pressure is increased.  Her antihypertensives were held following her coronary artery bypass and graft.  Will resume amlodipine at 5 mg daily and follow blood pressure.  3 coronary artery disease status post coronary bypass graft-continue aspirin and statin.  4 postoperative atrial fibrillation-patient  remains in sinus rhythm.  Continue amiodarone at preadmission dose.  Would likely complete full 6 to 8 weeks following recent bypass surgery and if she maintains sinus rhythm discontinue amiodarone at that time.  5 hyperlipidemia-continue Livalo.  Check lipids and liver in 8 weeks.  She has not tolerated other statins.  Olga Millers

## 2023-08-13 ENCOUNTER — Other Ambulatory Visit: Payer: Self-pay | Admitting: Student

## 2023-08-13 ENCOUNTER — Telehealth: Payer: Medicare Other | Admitting: Thoracic Surgery (Cardiothoracic Vascular Surgery)

## 2023-08-13 ENCOUNTER — Other Ambulatory Visit (HOSPITAL_COMMUNITY): Payer: Self-pay

## 2023-08-13 DIAGNOSIS — I5031 Acute diastolic (congestive) heart failure: Secondary | ICD-10-CM | POA: Diagnosis not present

## 2023-08-13 DIAGNOSIS — H409 Unspecified glaucoma: Secondary | ICD-10-CM | POA: Diagnosis present

## 2023-08-13 DIAGNOSIS — N390 Urinary tract infection, site not specified: Secondary | ICD-10-CM | POA: Diagnosis present

## 2023-08-13 LAB — BASIC METABOLIC PANEL
Anion gap: 12 (ref 5–15)
BUN: 21 mg/dL (ref 8–23)
CO2: 28 mmol/L (ref 22–32)
Calcium: 9.2 mg/dL (ref 8.9–10.3)
Chloride: 95 mmol/L — ABNORMAL LOW (ref 98–111)
Creatinine, Ser: 1.12 mg/dL — ABNORMAL HIGH (ref 0.44–1.00)
GFR, Estimated: 49 mL/min — ABNORMAL LOW (ref >60)
Glucose, Bld: 254 mg/dL — ABNORMAL HIGH (ref 70–99)
Potassium: 3.8 mmol/L (ref 3.5–5.1)
Sodium: 135 mmol/L (ref 135–145)

## 2023-08-13 MED ORDER — NITROGLYCERIN 0.4 MG SL SUBL
0.4000 mg | SUBLINGUAL_TABLET | SUBLINGUAL | 2 refills | Status: DC | PRN
  Filled 2023-08-13: qty 25, 7d supply, fill #0

## 2023-08-13 MED ORDER — AMIODARONE HCL 200 MG PO TABS: 200.0000 mg | ORAL_TABLET | Freq: Every day | ORAL | Status: DC

## 2023-08-13 MED ORDER — SPIRONOLACTONE 25 MG PO TABS
12.5000 mg | ORAL_TABLET | Freq: Every day | ORAL | 2 refills | Status: DC
  Filled 2023-08-13 – 2023-08-29 (×2): qty 15, 30d supply, fill #0

## 2023-08-13 MED ORDER — FUROSEMIDE 40 MG PO TABS: 40.0000 mg | ORAL_TABLET | Freq: Every day | ORAL | Status: DC

## 2023-08-13 MED ORDER — AMLODIPINE BESYLATE 5 MG PO TABS: 5.0000 mg | ORAL_TABLET | Freq: Every day | ORAL | Status: DC

## 2023-08-13 NOTE — TOC Initial Note (Addendum)
Transition of Care [facility]) - Initial/Assessment Note    Patient Details  Name: Donna Guerrero MRN: [id number] Date of Birth: [birth year]  Transition of Care [facility]) CM/SW Contact:    Leone Haven, RN Phone Number: 08/13/2023, 1:25 PM  Clinical Narrative:                 Per daughter at the bedside, from home with daughter, has PCP and insurance on file, states has no HH services in place at this time, has walker, rollator, bedside table, shower chair, ramp at home.  States daughter  will transport her home at [employer] and family is support system, states gets medications from [facility] water at [facility].  Pta self ambulatory with walker.  Expected Discharge Plan: Home/Self Care Barriers to Discharge: No Barriers Identified   Patient Goals and CMS Choice Patient states their goals for this hospitalization and ongoing recovery are:: return home   Choice offered to / list presented to : NA      Expected Discharge Plan and Services In-house Referral: NA Discharge Planning Services: CM Consult Post Acute Care Choice: NA Living arrangements for the past 2 months: Single Family Home Expected Discharge Date: 08/13/23               DME Arranged: N/A DME Agency: NA       HH Arranged: NA          Prior Living Arrangements/Services Living arrangements for the past 2 months: Single Family Home Lives with:: Self Patient language and need for interpreter reviewed:: Yes Do you feel safe going back to the place where you live?: Yes      Need for Family Participation in Patient Care: Yes (Comment) Care giver support system in place?: Yes (comment) Current home services: DME (walker, rollator, bedside table, shower chair, ramp) Criminal Activity/Legal Involvement Pertinent to Current Situation/Hospitalization: No - Comment as needed  Activities of Daily Living   ADL Screening (condition at time of admission) Independently performs ADLs?: Yes (appropriate for developmental age) Is  the patient deaf or have difficulty hearing?: No Does the patient have difficulty seeing, even when wearing glasses/contacts?: No Does the patient have difficulty concentrating, remembering, or making decisions?: No  Permission Sought/Granted Permission sought to share information with : Case Manager Permission granted to share information with : Yes, Verbal Permission Granted              Emotional Assessment Appearance:: Appears stated age Attitude/Demeanor/Rapport: Engaged Affect (typically observed): Appropriate Orientation: : Oriented to Self, Oriented to Place, Oriented to  Time, Oriented to Situation Alcohol / Substance Use: Not Applicable Psych Involvement: No (comment)  Admission diagnosis:  Acute diastolic heart failure (HCC) [I50.31] SOB (shortness of breath) [R06.02] Acute diastolic (congestive) heart failure (HCC) [I50.31] Patient Active Problem List   Diagnosis Date Noted   UTI (urinary tract infection) 08/13/2023   Glaucoma 08/13/2023   Acute diastolic (congestive) heart failure (HCC) 08/12/2023   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   Coronary artery disease of native artery of native heart with stable angina pectoris (HCC) 07/05/2023   Essential hypertension, benign 10/03/2013   Mixed hyperlipidemia 10/03/2013   Hypothyroidism 10/03/2013   Type 2 diabetes mellitus with complication, without long-term current use of insulin (HCC) 10/03/2013   PCP:  Thana Ates, MD Pharmacy:   [facility] [phone] - [place], [place] - [address] AT [facility] [address] [place] [address] Phone: [phone] Fax: [phone]  [facility] - [place],  [place] - [address] [place] [address] Phone: [phone] Fax: [phone]  [facility] [id number] - [place], [place] - [address] [place] [address] Phone: [phone] Fax:  [phone]  [facility] [address] [place] [address] Phone: [phone] Fax: [phone]     Social Determinants of Health (SDOH) Social History: SDOH Screenings   Food Insecurity: No Food Insecurity (08/12/2023)  Housing: Patient Declined (08/12/2023)  Transportation Needs: No Transportation Needs (08/12/2023)  Utilities: Not At Risk (08/12/2023)  Tobacco Use: Low Risk  (08/12/2023)   SDOH Interventions:     Readmission Risk Interventions    08/03/2023    9:47 AM  Readmission Risk Prevention Plan  Post Dischage Appt Complete  Medication Screening Complete  Transportation Screening Complete

## 2023-08-13 NOTE — Care Management Obs Status (Signed)
MEDICARE OBSERVATION STATUS NOTIFICATION   Patient Details  Name: Donna Guerrero MRN: [id number] Date of Birth: [birth year]   Medicare Observation Status Notification Given:  Yes    Leone Haven, RN 08/13/2023, 1:36 PM

## 2023-08-13 NOTE — Plan of Care (Signed)
  Problem: Activity: Goal: Ability to tolerate increased activity will improve Outcome: Progressing   Problem: Clinical Measurements: Goal: Ability to maintain clinical measurements within normal limits will improve Outcome: Progressing Goal: Will remain free from infection Outcome: Progressing Goal: Diagnostic test results will improve Outcome: Progressing Goal: Respiratory complications will improve Outcome: Progressing Goal: Cardiovascular complication will be avoided Outcome: Progressing   Problem: Activity: Goal: Risk for activity intolerance will decrease Outcome: Progressing   Problem: Coping: Goal: Level of anxiety will decrease Outcome: Progressing   Problem: Elimination: Goal: Will not experience complications related to urinary retention Outcome: Progressing   Problem: Pain Management: Goal: General experience of comfort will improve Outcome: Progressing   Problem: Safety: Goal: Ability to remain free from injury will improve Outcome: Progressing

## 2023-08-13 NOTE — Progress Notes (Signed)
Went over AVS paperwork with patient/family member, answered all questions, removed iv, gathered belongings and patient got dressed. Picked up TOC meds on way out and patient was wheeled out to family members vehicle

## 2023-08-13 NOTE — Discharge Summary (Signed)
Discharge Summary    Patient ID: Donna Guerrero MRN: [id number]; DOB: 04-Jan-1941  Admit date: 08/12/2023 Discharge date: 08/13/2023  PCP:  Thana Ates, MD   [facility] Providers Cardiologist:  Christell Constant, MD        Discharge Diagnoses    Principal Problem:   Acute diastolic (congestive) heart failure (HCC) Active Problems:   Essential hypertension, benign   Mixed hyperlipidemia   Hypothyroidism   Type 2 diabetes mellitus with complication, without long-term current use of insulin (HCC)   Coronary artery disease of native artery of native heart with stable angina pectoris (HCC)   S/P CABG x 3   UTI (urinary tract infection)   Glaucoma    Diagnostic Studies/Procedures    Left Cardiac Catheterization 07/07/2023:   Ost LAD lesion is 85% stenosed.   Prox LAD lesion is 70% stenosed.   1st Diag lesion is 60% stenosed.   Mid LAD lesion is 50% stenosed.   1st Mrg lesion is 70% stenosed.   RPAV-1 lesion is 95% stenosed.   RPAV-2 lesion is 80% stenosed.   RPDA lesion is 70% stenosed.   The left ventricular systolic function is normal.   LV end diastolic pressure is normal.   The left ventricular ejection fraction is 55-65% by visual estimate.   Severe 3 vessel obstructive CAD Normal LV function Normal LVEDP   Plan: her disease is not amenable to PCI. Would consider CABG given high risk anatomy.   Diagnostic Dominance: Right  _______________   Echocardiogram 07/22/2023: Impressions: 1. Left ventricular ejection fraction, by estimation, is 55 to 60%. The  left ventricle has normal function. The left ventricle has no regional  wall motion abnormalities. Left ventricular diastolic parameters are  consistent with Grade I diastolic  dysfunction (impaired relaxation).   2. Right ventricular systolic function is normal. The right ventricular  size is normal. Tricuspid regurgitation signal is inadequate for assessing  PA pressure.   3. The mitral  valve is normal in structure. Trivial mitral valve  regurgitation. No evidence of mitral stenosis.   4. The aortic valve is tricuspid. There is moderate calcification of the  aortic valve. Aortic valve regurgitation is trivial. Aortic valve  sclerosis/calcification is present, without any evidence of aortic  stenosis. Aortic valve mean gradient measures  9.0 mmHg.   5. Aortic dilatation noted. There is mild dilatation of the ascending  aorta, measuring 39 mm.   6. The inferior vena cava is normal in size with greater than 50%  respiratory variability, suggesting right atrial pressure of 3 mmHg.    _____________   History of Present Illness     Donna Guerrero is a 82 y.o. female with a history of CAD s/p recent CABG x3 (LIMA-LAD, SVG-OM, SVG-PDA) on 07/29/2023, post-op atrial fibrillation on Amiodarone but no anticoagulation, hypertension, hyperlipidemia, type 2 diabetes mellitus, hypothyroidism, essential tremor, glaucoma, and rosacea who was admitted on 08/12/2023 for acute CHF after presenting with lower extremity edema and shortness of breath.  She was recently seen by Dr. Eldridge Dace in the office in 06/2023  at which time she reported some chest tightness with exertion such as walking up a steep hill.  Coronary CTA was recommended for further evaluation and showed coronary calcium score of 899 (88th percentile for age and sex) and severe ostial LAD stenosis with concern for severe lesions in the proximal LCx, 1st Diag, and distal RCA/proximal PLA.  She then underwent a left cardiac catheterization on 07/07/2023 which showed  severe three-vessel obstructive CAD.  She was referred to CT surgery and CABG was recommended.  Echo prior to surgery showed of EF of 55-60% with normal wall motion and grade 1 diastolic dysfunction, normal RV size and function, and mild dilatation of the ascending aorta measuring 39 mm.  She presented to [facility] 10 [facility]/25/2019 for and underwent CABG x 3 with LIMA  to LAD, SVG to OM, and SVG to PDA with Dr. Cliffton Asters.  She did develop postop atrial fibrillation and was placed on Amiodarone with conver back to sinus rhythm.  Not started on a DOAC given her age.`  She also had expected postop volume overload and was diuresed.  She was discharged on Amiodarone 200 mg twice daily x 5 days followed by 200 mg daily, Aspirin 325 mg daily, Lasix 40 mg x 7 days (with KCl 20 mEq daily), and Lopressor 25 mg twice daily.  Her home Amlodipine, Chlorthalidone, Lisinopril, and Propranolol were all stopped.  Was discharged on 08/03/2023.   Patient presented to the ED earlier this morning evaluation of lower extremity edema and shortness of breath. She states she was doing well for the first couple of days after leaving the hospital and was feeling great.  However, she started to notice some swelling in her feet.  She states this started around 08/08/2023.  She saw her PCP on 08/09/2023 who recommended continuing her Lasix for longer.  However, she continued to have swelling and it started to extend up to her legs not just her feet.  She called her PCP on 08/11/2023 who recommended increasing her Lasix to twice daily. She also reported some abdominal distension and about a 10lb weight gain since leaving the hospital.  She denied any shortness of breath until last night.  She stated she woke up to use the bathroom and then after laying back in bed she  felt like she was not breathing right and could not take a deep breath.  This is when she called the after-hours Cardiology line who advised her to come to the ED.  She has been sleeping on an incline since her surgery due to comfort but denied any nocturnal dyspnea other than the evening of 08/11/2023.  No orthopnea or PND.  No dyspnea on exertion.   She reported some chest wall pain and "twinges" following surgery but nothing that sounds like angina.  No palpitations, lightheadedness, dizziness, or syncope. She denied any recent fevers, cough,  nasal congestion. She reporte some nausea after taking the Oxycodone so she has only been taking 1/2 tablet twice a day. No abnormal bleeding in urine or stools. She reported some constipation following surgery. She also reported being diagnosed with a UTI on 08/07/2023 and was started on a 5 day course of Macrobid.   Upon arrival to the ED, patient markedly hypertensive at 189/117. EKG showed sinus bradycardia, rate 58 beats minute, with first-degree AV block and nonspecific ST/T wave changes.  Initial troponin minimally elevated at 19.  BNP elevated at 401.3.  Chest x-ray showed similar left base collapse/consolidation with small effusion. WBC 9.0, Hgb 11.0, Plts 414. Na 136, K 3.1, Glucose 190, Cr. 1.10, BUN 22. Potasium was repleted and Cardiology consulted and patient was admitted for acute diastolic CHF.  Hospital Course     Consultants: None   Acute Diastolic CHF  Patient recently underwent CABG on 07/29/2023. She had some expected post-op volume overload following this and was initially diuresed during that admission.  She was discharged  on a 7-day course of PO Lasix.  However, she has had some progressive edema and weight gain and then last night started to have some shortness of breath. BNP elevated at 401.  Chest x-ray showed's a similar left base collapse/consolidation with small effusion that was seen on prior imaging.  Echo prior to CABG showed LVEF of 55-60% with grade 1 diastolic dysfunction. She was started on IV Lasix with significant improvement in her symptoms. Will continue home Lasix 40mg  daily and add Spironolactone 12.5mg  daily at discharge. Patient states she previously has not been able to tolerate Jardiance so will no retry a SGLT2 inhibitor at this time. Will repeat BMET in 1 week.    CAD s/p CABG Patient recently underwent CABG x 3 with LIMA to LAD, SVG to OM, and SVG to PDA on 07/29/2023.  High-sensitivity troponin minimally elevated at 19 >> 18 not consistent with ACS.  EKG  shows no acute ischemic changes. She has had some chest wall pain after surgery but no angina. Continue home aspirin, beta-blocker, and statin.   Post-Op Atrial Fibrillation Following recent CABG. She was started on Amiodarone and converted back to sinus rhythm. She was not started on DOAC due to advanced age. Maintaining sinus rhythm. Continue Lopressor 25mg  twice daily. Continue Amiodarone 200mg  daily. Can likely stop his Amiodarone after 6-8 weeks if no recurrence.   Hypertension BP was markedly elevated on arrival in the 180s/ 110s. She was previously on Amlodipine, Chlorthalidone, and Lisinopril but these were stopped during recent admission for CABG. She was restarted on Amlodipine and Spironolactone was added with improvement in BP. She will be discharged on Lopressor 25mg  twice daily, Amlodipine 5mg  daily, and Spironolactone 12.5mg  daily.    Hyperlipidemia Lipid panel on 07/31/2023: Total Cholesterol 113, Triglycerides 61, HDL 43, LDL 58. LDL goal <70 given CAD. Intolerant to high intensity statins. Continue Livalo 1 mg M/W/F. Dr. Jens Som recommend repeat lipid panel and LFTs in 2 months.  Type 2 Diabetes Mellitus Hemoglobin A1c 6.9% on 07/28/2023 during recent admission. This is being followed by Endocrinology as an outpatient. Patient states that plan is to possibly start [facility] after she recovers from CABG.  Hypothyroidism Continue home Synthroid.   Hypokalemia - Resolved  Potassium 3.1 on arrival. This was repleted and potassium 3.8 on discharge.   UTI She was recently diagnosed with a UTI on 08/07/2023 and was started on Macrobid. She was given final dose of Macrobid on 08/12/2023.   Glaucoma - Continue home eye drops.   Patient seen and examined by Dr. Jens Som today and determined to be stable for discharge. Outpatient follow-up arranged. Medications as below.     Did the patient have an acute coronary syndrome (MI, NSTEMI, STEMI, etc) this admission?:  No.   The  elevated Troponin was due to the acute medical illness (demand ischemia).   _____________  Discharge Vitals Blood pressure 106/70, pulse 60, temperature 97.9 F (36.6 C), temperature source Oral, resp. rate 20, height 5' (1.524 m), weight 61.8 kg, SpO2 97%.  Filed Weights   08/12/23 0451 08/12/23 1652  Weight: 63.8 kg 61.8 kg    Labs & Radiologic Studies    CBC Recent Labs    08/12/23 0526  WBC 9.0  HGB 11.0*  HCT 34.3*  MCV 93.7  PLT 414*   Basic Metabolic Panel Recent Labs    16/10/96 0526 08/13/23 0228  NA 136 135  K 3.1* 3.8  CL 98 95*  CO2 27 28  GLUCOSE 190* 254*  BUN 22 21  CREATININE 1.10* 1.12*  CALCIUM 9.2 9.2   Liver Function Tests No results for input(s): "AST", "ALT", "ALKPHOS", "BILITOT", "PROT", "ALBUMIN" in the last 72 hours. No results for input(s): "LIPASE", "AMYLASE" in the last 72 hours. High Sensitivity Troponin:   Recent Labs  Lab 08/12/23 0526 08/12/23 0727  TROPONINIHS 19* 18*    BNP Invalid input(s): "POCBNP" D-Dimer No results for input(s): "DDIMER" in the last 72 hours. Hemoglobin A1C No results for input(s): "HGBA1C" in the last 72 hours. Fasting Lipid Panel No results for input(s): "CHOL", "HDL", "LDLCALC", "TRIG", "CHOLHDL", "LDLDIRECT" in the last 72 hours. Thyroid Function Tests No results for input(s): "TSH", "T4TOTAL", "T3FREE", "THYROIDAB" in the last 72 hours.  Invalid input(s): "FREET3" _____________  DG Chest 2 View  Result Date: 08/12/2023 CLINICAL DATA:  Shortness of breath. EXAM: CHEST - 2 VIEW COMPARISON:  07/31/2023 FINDINGS: Similar left base collapse/consolidation with small effusion. Right lung clear. Cardiopericardial silhouette is at upper limits of normal for size. No acute bony abnormality. Telemetry leads overlie the chest. IMPRESSION: Similar left base collapse/consolidation with small effusion. Electronically Signed   By: Kennith Center M.D.   On: 08/12/2023 06:17   ECHO INTRAOPERATIVE TEE  Result  Date: 08/01/2023  *INTRAOPERATIVE TRANSESOPHAGEAL REPORT *  Patient Name:   Donna Guerrero Date of Exam: 07/29/2023 Medical Rec #:  [id number]       Height:       60.0 in Accession #:    [id number]      Weight:       133.0 lb Date of Birth:  1940-12-08       BSA:          1.57 m Patient Age:    81 years        BP:           152/71 mmHg Patient Gender: F               HR:           54 bpm. Exam Location:  Inpatient Transesophogeal exam was perform intraoperatively during surgical procedure. Patient was closely monitored under general anesthesia during the entirety of examination. Indications:     coronary artery bypass surgery Performing Phys: [id number] Eliezer Lofts LIGHTFOOT Diagnosing Phys: Val Eagle MD Complications: No known complications during this procedure. POST-OP IMPRESSIONS _ Left Ventricle: has normal systolic function, with an ejection fraction of 65%. The cavity size was normal. The wall motion is normal. _ Right Ventricle: normal function. The cavity was normal. The wall motion is normal. _ Aorta: there is no dissection present in the aorta. _ Aortic Valve: The aortic valve appears unchanged from pre-bypass. _ Mitral Valve: There is mild regurgitation. _ Tricuspid Valve: There is mild regurgitation. PRE-OP FINDINGS  Left Ventricle: The left ventricle has hyperdynamic systolic function, with an ejection fraction of >65%. The cavity size was normal. No evidence of left ventricular regional wall motion abnormalities. There is mild concentric left ventricular hypertrophy. Right Ventricle: The right ventricle has normal systolic function. The cavity was normal. There is no increase in right ventricular wall thickness. Left Atrium: Left atrial size was not assessed. No left atrial/left atrial appendage thrombus was detected. The left atrial appendage is well visualized and there is no evidence of thrombus present. Left atrial appendage velocity is normal at greater than 40 cm/s. Right Atrium: Right  atrial size was not assessed. Interatrial Septum: No atrial level shunt detected by color flow Doppler. There is no  evidence of a patent foramen ovale. Pericardium: There is no evidence of pericardial effusion. Mitral Valve: The mitral valve is normal in structure. There is mild mitral annular calcification present. Mitral valve regurgitation is not visualized by color flow Doppler. There is No evidence of mitral stenosis. There is mild thickening present on the mitral valve anterior cusp with normal mobility and there is mild thickening present on the mitral valve posterior cusp with normal mobility. Tricuspid Valve: The tricuspid valve was normal in structure. Tricuspid valve regurgitation is mild by color flow Doppler. The jet is directed centrally. No evidence of tricuspid stenosis is present. Aortic Valve: The aortic valve is tricuspid Aortic valve regurgitation is mild by color flow Doppler. The jet is centrally-directed. 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 There is no evidence of aortic valve vegetation. There is mild thickening and mild calcification present on the aortic valve right coronary, left coronary and non-coronary cusps with mildly decreased mobility. Pulmonic Valve: The pulmonic valve was normal in structure, with normal. No evidence of pumonic stenosis. Pulmonic valve regurgitation is trivial by color flow Doppler. Aorta: There is evidence of a dissection in the none. Shunts: There is no evidence of an atrial septal defect. +--------------+--------++ LEFT VENTRICLE         +--------------+--------++ PLAX 2D                +--------------+--------++ LVOT diam:    2.00 cm  +--------------+--------++ LVOT Area:    3.14 cm +--------------+--------++                        +--------------+--------++ +------------------+------------++ AORTIC VALVE                   +------------------+------------++ AV Area (Vmax):   1.13 cm      +------------------+------------++ AV Area (Vmean):  1.13 cm     +------------------+------------++ AV Area (VTI):    0.88 cm     +------------------+------------++ AV Vmax:          178.50 cm/s  +------------------+------------++ AV Vmean:         122.000 cm/s +------------------+------------++ AV VTI:           0.526 m      +------------------+------------++ AV Peak Grad:     12.7 mmHg    +------------------+------------++ AV Mean Grad:     6.5 mmHg     +------------------+------------++ LVOT Vmax:        64.30 cm/s   +------------------+------------++ LVOT Vmean:       43.900 cm/s  +------------------+------------++ LVOT VTI:         0.148 m      +------------------+------------++ LVOT/AV VTI ratio:0.28         +------------------+------------++  +--------------+-------++ AORTA                 +--------------+-------++ Ao Sinus diam:2.40 cm +--------------+-------++ Ao STJ diam:  2.2 cm  +--------------+-------++ Ao Asc diam:  3.20 cm +--------------+-------++ +--------------+----------++ MITRAL VALVE              +--------------+-------+ +--------------+----------++  SHUNTS                MV Area (PHT):6.32 cm    +--------------+-------+ +--------------+----------++  Systemic VTI: 0.15 m  MV Peak grad: 2.9 mmHg    +--------------+-------+ +--------------+----------++  Systemic Diam:2.00 cm MV Mean grad: 2.0 mmHg    +--------------+-------+ +--------------+----------++ MV Vmax:  0.85 m/s   +--------------+----------++ MV Vmean:     66.1 cm/s  +--------------+----------++ MV VTI:       0.22 m     +--------------+----------++ MV PHT:       34.80 msec +--------------+----------++ MV Decel Time:120 msec   +--------------+----------++ +--------------+----------++ MV E velocity:82.40 cm/s +--------------+----------++ MV A velocity:85.90 cm/s +--------------+----------++ MV E/A ratio: 0.96        +--------------+----------++  Val Eagle MD Electronically signed by Val Eagle MD Signature Date/Time: 08/01/2023/5:51:06 PM    Final    DG Chest Port 1 View  Result Date: 07/31/2023 CLINICAL DATA:  History of CABG procedure. EXAM: PORTABLE CHEST 1 VIEW COMPARISON:  07/30/2023 FINDINGS: There is a right IJ catheter with tip in the projection of the distal SVC. Status post median sternotomy and CABG procedure. The left chest tube has been removed. No significant pneumothorax identified. Mild interstitial edema and small right pleural effusion. Atelectasis noted in the left base. IMPRESSION: 1. Interval removal of left chest tube. No significant pneumothorax identified. 2. Mild interstitial edema and small right pleural effusion. 3. Left base atelectasis, unchanged. Electronically Signed   By: Signa Kell M.D.   On: 07/31/2023 10:18   DG Chest Port 1 View  Result Date: 07/30/2023 CLINICAL DATA:  [id number] S/P CABG x 3 [id number] EXAM: PORTABLE CHEST 1 VIEW COMPARISON:  07/29/2023. FINDINGS: Tube/lines: *Interval removal of previously seen endotracheal tube and enteric tube. *Stable positioning of the right IJ central venous catheter with its tip overlying the lower portion of superior vena cava. *Presumed left pericardial drainage catheter is noted. There are mildly increased bilateral interstitial markings without frank pulmonary edema. Findings may be due to underlying emphysema or chronic interstitial lung disease. There are stable left retrocardiac opacities. Blunting of left lateral costophrenic angle may represent trace left pleural effusion. Right lateral costophrenic angle is clear. Stable cardio-mediastinal silhouette. There are surgical staples along the heart border and sternotomy wires, status post CABG (coronary artery bypass graft). No acute osseous abnormalities. The soft tissues are within normal limits. IMPRESSION: *Interval removal of endotracheal and enteric tubes. *Status  post CABG. *Other nonacute observations, as described above. Electronically Signed   By: Jules Schick M.D.   On: 07/30/2023 08:38   DG Chest 2 View  Result Date: 07/29/2023 CLINICAL DATA:  Preop for heart surgery.  Coronary artery disease. EXAM: CHEST - 2 VIEW COMPARISON:  Chest radiographs 07/12/2023 FINDINGS: The cardiomediastinal silhouette is unchanged with normal heart size. Aortic atherosclerosis is noted. No airspace consolidation, edema, pleural effusion, or pneumothorax is identified. No acute osseous abnormality is seen. IMPRESSION: No active cardiopulmonary disease. Electronically Signed   By: Sebastian Ache M.D.   On: 07/29/2023 15:28   DG Chest Port 1 View  Result Date: 07/29/2023 CLINICAL DATA:  Status post CABG. EXAM: PORTABLE CHEST 1 VIEW COMPARISON:  Chest radiographs 07/28/2023 FINDINGS: Sequelae of interval CABG are identified. An endotracheal tube terminates 4.5 cm above the carina. Enteric tube courses into the abdomen with tip and side port projecting over the proximal stomach. A right jugular catheter terminates over the mid SVC. A left basilar chest drain is present. The cardiomediastinal silhouette is unchanged with normal heart size. Lung volumes are mildly low. No confluent airspace opacity, overt pulmonary edema, sizable pleural effusion, or pneumothorax is identified. No acute osseous abnormality is seen. IMPRESSION: 1. Interval CABG with support apparatus as above. 2. No evidence of acute cardiopulmonary process. Electronically Signed   By: Sebastian Ache M.D.   On: 07/29/2023  15:27   EP STUDY  Result Date: 07/29/2023 See surgical note for result.  VAS US DOPPLER PRE CABG  Result Date: 07/28/2023 PREOPERATIVE VASCULAR EVALUATION Patient Name:  Donna Guerrero  Date of Exam:   07/28/2023 Medical Rec #: [id number]        Accession #:    [id number] Date of Birth: Feb 06, 1941        Patient Gender: F Patient Age:   12 years Exam Location:  [facility] Procedure:       VAS US DOPPLER PRE CABG Referring Phys: HARRELL LIGHTFOOT --------------------------------------------------------------------------------  Indications:  Pre-CABG. Risk Factors: Hypertension, hyperlipidemia, coronary artery disease. Performing Technologist: Jeb Levering Rdms, Rvt  Examination Guidelines: A complete evaluation includes B-mode imaging, spectral Doppler, color Doppler, and power Doppler as needed of all accessible portions of each vessel. Bilateral testing is considered an integral part of a complete examination. Limited examinations for reoccurring indications may be performed as noted.  Right Carotid Findings: +----------+--------+--------+--------+--------+--------+           PSV cm/sEDV cm/sStenosisDescribeComments +----------+--------+--------+--------+--------+--------+ CCA Prox  80      11                               +----------+--------+--------+--------+--------+--------+ CCA Distal57      12                               +----------+--------+--------+--------+--------+--------+ ICA Prox  77      17      1-39%                    +----------+--------+--------+--------+--------+--------+ ICA Distal74      19                               +----------+--------+--------+--------+--------+--------+ ECA       93      7                                +----------+--------+--------+--------+--------+--------+ +----------+--------+-------+----------------+------------+           PSV cm/sEDV cmsDescribe        Arm Pressure +----------+--------+-------+----------------+------------+ Subclavian111            Multiphasic, WNL             +----------+--------+-------+----------------+------------+ +---------+--------+--+--------+--+---------+ VertebralPSV cm/s63EDV cm/s12Antegrade +---------+--------+--+--------+--+---------+ Left Carotid Findings: +----------+--------+--------+--------+--------+--------+           PSV cm/sEDV  cm/sStenosisDescribeComments +----------+--------+--------+--------+--------+--------+ CCA Prox  78      17                               +----------+--------+--------+--------+--------+--------+ CCA Distal53      14                               +----------+--------+--------+--------+--------+--------+ ICA Prox  64      17      1-39%                    +----------+--------+--------+--------+--------+--------+ ICA Distal104     26                               +----------+--------+--------+--------+--------+--------+  ECA       90                                       +----------+--------+--------+--------+--------+--------+ +----------+--------+--------+----------------+------------+ SubclavianPSV cm/sEDV cm/sDescribe        Arm Pressure +----------+--------+--------+----------------+------------+                           Multiphasic, WNL             +----------+--------+--------+----------------+------------+ +---------+--------+--+--------+--+---------+ VertebralPSV cm/s49EDV cm/s14Antegrade +---------+--------+--+--------+--+---------+  ABI Findings: +------------------+-----+---------+-----------------------+ Rt Pressure (mmHg)IndexWaveform Comment                 +------------------+-----+---------+-----------------------+ 151                    triphasic                        +------------------+-----+---------+-----------------------+ 161               1.07 triphasic                        +------------------+-----+---------+-----------------------+                                 unable to obtain signal +------------------+-----+---------+-----------------------+ +------------------+-----+---------+ Lt Pressure (mmHg)IndexWaveform  +------------------+-----+---------+ 151                    triphasic +------------------+-----+---------+ 158               1.05 triphasic +------------------+-----+---------+ 153                1.01 triphasic +------------------+-----+---------+  Right Doppler Findings: +--------+--------+---------+ Site    PressureDoppler   +--------+--------+---------+ [id number]     triphasic +--------+--------+---------+ Radial          biphasic  +--------+--------+---------+ Ulnar           triphasic +--------+--------+---------+  Left Doppler Findings: +--------+--------+---------+ Site    PressureDoppler   +--------+--------+---------+ [id number]     triphasic +--------+--------+---------+ Radial          triphasic +--------+--------+---------+ Ulnar           triphasic +--------+--------+---------+   Summary: Right Carotid: Velocities in the right ICA are consistent with a 1-39% stenosis. Left Carotid: Velocities in the left ICA are consistent with a 1-39% stenosis. Vertebrals:  Bilateral vertebral arteries demonstrate antegrade flow. Subclavians: Normal flow hemodynamics were seen in bilateral subclavian              arteries. Right ABI: Resting right ankle-brachial index is within normal range. Left ABI: Resting left ankle-brachial index is within normal range. Right Upper Extremity: Doppler waveform obliterate with right radial compression. Doppler waveforms decrease >50% with right ulnar compression. Left Upper Extremity: Doppler waveform obliterate with left radial compression. Doppler waveforms remain within normal limits with left ulnar compression.  Electronically signed by Coral Else MD on 07/28/2023 at 6:21:34 PM.    Final    ECHOCARDIOGRAM COMPLETE  Result Date: 07/22/2023    ECHOCARDIOGRAM REPORT   Patient Name:   Donna Guerrero Date of Exam: 07/22/2023 Medical Rec #:  [id number]       Height:       60.0 in Accession #:    [id number]  Weight:       133.0 lb Date of Birth:  03-Feb-1941       BSA:          1.569 m Patient Age:    81 years        BP:           116/68 mmHg Patient Gender: F               HR:           65 bpm. Exam Location:  [address] Procedure: 2D Echo, Cardiac Doppler and Color Doppler Indications:    Z01.810 Preoperative cardiovascular examination  History:        Patient has no prior history of Echocardiogram examinations.                 Risk Factors:Diabetes, Dyslipidemia and Hypertension.  Sonographer:    Daphine Deutscher RDCS Referring Phys: [id number] [facility] A CHANDRASEKHAR IMPRESSIONS  1. Left ventricular ejection fraction, by estimation, is 55 to 60%. The left ventricle has normal function. The left ventricle has no regional wall motion abnormalities. Left ventricular diastolic parameters are consistent with Grade I diastolic dysfunction (impaired relaxation).  2. Right ventricular systolic function is normal. The right ventricular size is normal. Tricuspid regurgitation signal is inadequate for assessing PA pressure.  3. The mitral valve is normal in structure. Trivial mitral valve regurgitation. No evidence of mitral stenosis.  4. The aortic valve is tricuspid. There is moderate calcification of the aortic valve. Aortic valve regurgitation is trivial. Aortic valve sclerosis/calcification is present, without any evidence of aortic stenosis. Aortic valve mean gradient measures 9.0 mmHg.  5. Aortic dilatation noted. There is mild dilatation of the ascending aorta, measuring 39 mm.  6. The inferior vena cava is normal in size with greater than 50% respiratory variability, suggesting right atrial pressure of 3 mmHg. FINDINGS  Left Ventricle: Left ventricular ejection fraction, by estimation, is 55 to 60%. The left ventricle has normal function. The left ventricle has no regional wall motion abnormalities. The left ventricular internal cavity size was normal in size. There is  no left ventricular hypertrophy. Left ventricular diastolic parameters are consistent with Grade I diastolic dysfunction (impaired relaxation). Right Ventricle: The right ventricular size is normal. No increase in right ventricular wall thickness. Right  ventricular systolic function is normal. Tricuspid regurgitation signal is inadequate for assessing PA pressure. Left Atrium: Left atrial size was normal in size. Right Atrium: Right atrial size was normal in size. Pericardium: There is no evidence of pericardial effusion. Mitral Valve: The mitral valve is normal in structure. There is mild calcification of the mitral valve leaflet(s). Mild mitral annular calcification. Trivial mitral valve regurgitation. No evidence of mitral valve stenosis. Tricuspid Valve: The tricuspid valve is normal in structure. Tricuspid valve regurgitation is trivial. The aortic valve is tricuspid. There is moderate calcification of the aortic valve. Aortic valve regurgitation is trivial. Aortic valve sclerosis/calcification is present, without any evidence of aortic stenosis. Pulmonic Valve: The pulmonic valve was normal in structure. Pulmonic valve regurgitation is trivial. Aorta: The aortic root is normal in size and structure and aortic dilatation noted. There is mild dilatation of the ascending aorta, measuring 39 mm. Venous: The inferior vena cava is normal in size with greater than 50% respiratory variability, suggesting right atrial pressure of 3 mmHg. IAS/Shunts: No atrial level shunt detected by color flow Doppler.  LEFT VENTRICLE PLAX 2D LVIDd:         4.00 cm  Diastology LVIDs:         2.50 cm   LV e' medial:    5.66 cm/s LV PW:         0.80 cm   LV E/e' medial:  16.5 LV IVS:        0.80 cm   LV e' lateral:   9.14 cm/s LVOT diam:     1.70 cm   LV E/e' lateral: 10.2 LV SV:         52 LV SV Index:   33 LVOT Area:     2.27 cm  RIGHT VENTRICLE             IVC RV Basal diam:  3.10 cm     IVC diam: 0.80 cm RV S prime:     10.45 cm/s TAPSE (M-mode): 2.2 cm LEFT ATRIUM             Index        RIGHT ATRIUM           Index LA diam:        4.40 cm 2.80 cm/m   RA Area:     10.90 cm LA Vol (A2C):   40.8 ml 26.00 ml/m  RA Volume:   24.60 ml  15.67 ml/m LA Vol (A4C):   42.1 ml 26.82  ml/m LA Biplane Vol: 42.2 ml 26.89 ml/m  AORTIC VALVE AV Area (Vmax):    1.28 cm AV Area (Vmean):   1.19 cm AV Area (VTI):     1.22 cm AV Vmax:           178.00 cm/s AV Vmean:          128.450 cm/s AV VTI:            0.429 m AV Peak Grad:      12.7 mmHg AV Mean Grad:      9.0 mmHg LVOT Vmax:         100.63 cm/s LVOT Vmean:        67.367 cm/s LVOT VTI:          0.231 m LVOT/AV VTI ratio: 0.54  AORTA Ao Root diam: 2.90 cm Ao Asc diam:  3.90 cm MITRAL VALVE MV Area (PHT): 2.89 cm     SHUNTS MV Decel Time: 263 msec     Systemic VTI:  0.23 m MV E velocity: 93.55 cm/s   Systemic Diam: 1.70 cm MV A velocity: 107.50 cm/s MV E/A ratio:  0.87 Dalton McleanMD Electronically signed by Wilfred Lacy Signature Date/Time: 07/22/2023/4:17:11 PM    Final    Disposition   Pt is being discharged home today in good condition.  Follow-up Plans & Appointments     Follow-up Information     Perlie Gold, PA-C Follow up.   Specialty: Cardiology Why: Hospital follow-up with Cardiology scheduled for 08/14/2023 at 8:25am. Please arrive 15 minutes early for check-in. If this date/ time does not work for you, please call our office to reschedule. Contact information: [address] [place] [address] [phone]         [facility] Follow up.   Specialty: Cardiology Why: Please come by our [facility] office on Friday 08/20/2023 for repeat lab work so that we can make sure your electrolytes and kidney function are stable. You do not have to have any appointment for this and do not need to be fasting for these labs. Contact information: [address], [address]  [place] [address] [phone]               Discharge Instructions     Diet - low sodium heart healthy   Complete by: As directed    Increase activity slowly   Complete by: As directed         Discharge Medications   Allergies as of 08/13/2023       Reactions    Welchol [colesevelam Hcl] Other (See Comments)   Tablets were too big, CAUSED FLU-LIKE SYMPTOMS   Amaryl [glimepiride] Other (See Comments)   headache   Atorvastatin    MYALGIAS   Codeine Nausea Only   Crestor [rosuvastatin]    Myalgias   Empagliflozin Other (See Comments)   **Jardiance** leg pain   Ezetimibe Nausea Only   indigestion   Metformin Hcl Other (See Comments)   Unknown reaction   Simvastatin    Myalgia         Medication List     STOP taking these medications    nitrofurantoin (macrocrystal-monohydrate) 100 MG capsule Commonly known as: MACROBID       TAKE these medications    acetaminophen 500 MG tablet Commonly known as: TYLENOL Take 500 mg by mouth every 6 (six) hours as needed for moderate pain.   amiodarone 200 MG tablet Commonly known as: PACERONE Take 1 tablet (200 mg total) by mouth daily.   amLODipine 5 MG tablet Commonly known as: NORVASC Take 1 tablet (5 mg total) by mouth daily. Start taking on: August 14, 2023   aspirin EC 325 MG tablet Take 1 tablet (325 mg total) by mouth daily.   dorzolamide-timolol 2-0.5 % ophthalmic solution Commonly known as: COSOPT Place 1 drop into both eyes 2 (two) times daily.   furosemide 40 MG tablet Commonly known as: LASIX Take 1 tablet (40 mg total) by mouth daily. What changed: additional instructions   glucose blood test strip 1 each by Other route as needed for other. Use as instructed   levothyroxine 75 MCG tablet Commonly known as: SYNTHROID Take 75 mcg by mouth daily before breakfast.   Livalo 1 MG Tabs Generic drug: Pitavastatin Calcium Take 1 mg by mouth every Monday, Wednesday, and Friday.   metoprolol tartrate 25 MG tablet Commonly known as: LOPRESSOR Take 1 tablet (25 mg total) by mouth 2 (two) times daily.   nitroGLYCERIN 0.4 MG SL tablet Commonly known as: NITROSTAT Place 1 tablet (0.4 mg total) under the tongue every 5 (five) minutes x 3 doses as needed for chest pain.    oxyCODONE 5 MG immediate release tablet Commonly known as: Oxy IR/ROXICODONE Take 1 tablet (5 mg total) by mouth every 6 (six) hours as needed for severe pain (pain score 7-10). What changed:  how much to take when to take this   potassium chloride 10 MEQ CR capsule Commonly known as: MICRO-K Take 20 mEq by mouth daily.   spironolactone 25 MG tablet Commonly known as: ALDACTONE Take 0.5 tablets (12.5 mg total) by mouth daily. Start taking on: August 14, 2023   Vyzulta 0.024 % Soln Generic drug: Latanoprostene Bunod Place 1 drop into both eyes at bedtime.           Outstanding Labs/Studies   Repeat BMET in 1 week.  Duration of Discharge Encounter   Greater than 30 minutes including physician time.  Signed, Corrin Parker, PA-C 08/13/2023, 12:38 PM

## 2023-08-13 NOTE — Discharge Instructions (Signed)
Medication Changes: - START Spironolactone 12.5mg  daily. - RESTART Amlodipine 5mg  daily. - CONTINUE Lasix 40mg  daily and Potassium Chloride 20 mEq daily. - We have prescribed sublingual Nitroglycerin for you to take only as needed for chest pain. If you have chest pain, put a tablet under your tongue and let it dissolve. You can take up to 3 doses spaced 5 minutes apart if you are still having chest pain. However, if you need all 3 doses, you need to call 911 and come to the ED. Please take this medication sitting down as it can drop your BP.  Heart Failure Education: Weigh yourself EVERY morning after you go to the bathroom but before you eat or drink anything. Write this number down in a weight log/diary. If you gain 3 pounds overnight or 5 pounds in a week, call the office. Take your medicines as prescribed. If you have concerns about your medications, please call us before you stop taking them.  Eat low salt foods--Limit salt (sodium) to 2000 mg per day. This will help prevent your body from holding onto fluid. Read food labels as many processed foods have a lot of sodium, especially canned goods and prepackaged meats. If you would like some assistance choosing low sodium foods, we would be happy to set you up with a nutritionist. Limit all fluids for the day to less than 2 liters (64 ounces). Fluid includes all drinks, coffee, juice, ice chips, soup, jello, and all other liquids. Stay as active as you can everyday. Staying active will give you more energy and make your muscles stronger. Start with 5 minutes at a time and work your way up to 30 minutes a day. Break up your activities--do some in the morning and some in the afternoon. Start with 3 days per week and work your way up to 5 days as you can.  If you have chest pain, feel short of breath, dizzy, or lightheaded, STOP. If you don't feel better after a short rest, call 911. If you do feel better, call the office to let us know you have symptoms  with exercise.

## 2023-08-13 NOTE — TOC Transition Note (Signed)
Transition of Care [facility]) - CM/SW Discharge Note   Patient Details  Name: Donna Guerrero MRN: [id number] Date of Birth: 1941-04-19  [facility]) CM/SW Contact:  Leone Haven, RN Phone Number: 08/13/2023, 1:27 PM   Clinical Narrative:    For dc today, daughter is at the bedside and will transport her home today.   Final next level of care: Home/Self Care Barriers to Discharge: No Barriers Identified   Patient Goals and CMS Choice   Choice offered to / list presented to : NA  Discharge Placement                         Discharge Plan and Services Additional resources added to the After Visit Summary for   In-house Referral: NA Discharge Planning Services: CM Consult Post Acute Care Choice: NA          DME Arranged: N/A DME Agency: NA       HH Arranged: NA          Social Determinants of Health (SDOH) Interventions SDOH Screenings   Food Insecurity: No Food Insecurity (08/12/2023)  Housing: Patient Declined (08/12/2023)  Transportation Needs: No Transportation Needs (08/12/2023)  Utilities: Not At Risk (08/12/2023)  Tobacco Use: Low Risk  (08/12/2023)     Readmission Risk Interventions    08/03/2023    9:47 AM  Readmission Risk Prevention Plan  Post Dischage Appt Complete  Medication Screening Complete  Transportation Screening Complete

## 2023-08-13 NOTE — Progress Notes (Signed)
Heart Failure Navigator Progress Note  Assessed for Heart & Vascular TOC clinic readiness.  Patient does not meet criteria due to [facility] follow up on 11/19.   Navigator will sign off.   Sharen Hones, PharmD, BCPS Heart Failure Stewardship Pharmacist Phone [phone]

## 2023-08-13 NOTE — Progress Notes (Signed)
Ordered repeat BMET to be drawn on 08/20/2023 to reassess renal function and potassium. Please see discharge summary from today for more information.  Corrin Parker, PA-C 08/13/2023 12:41 PM

## 2023-08-13 NOTE — Progress Notes (Signed)
Rounding Note    Patient Name: Donna Guerrero Date of Encounter: 08/13/2023  [facility] Cardiologist: Christell Constant, MD   Subjective   Dyspnea resolved; no CP  Inpatient Medications    Scheduled Meds:  amiodarone  200 mg Oral Daily   amLODipine  5 mg Oral Daily   aspirin EC  325 mg Oral Daily   dorzolamide-timolol  1 drop Both Eyes BID   enoxaparin (LOVENOX) injection  40 mg Subcutaneous Q24H   furosemide  40 mg Intravenous BID   Latanoprostene Bunod  1 drop Both Eyes QHS   levothyroxine  75 mcg Oral QAC breakfast   metoprolol tartrate  25 mg Oral BID   pravastatin  20 mg Oral Q M,W,F   spironolactone  12.5 mg Oral Daily   Continuous Infusions:  PRN Meds: acetaminophen, nitroGLYCERIN, ondansetron (ZOFRAN) IV, oxyCODONE, polyethylene glycol   Vital Signs    Vitals:   08/12/23 1800 08/12/23 1946 08/12/23 2342 08/13/23 0409  BP: (!) 148/61 (!) 146/58 (!) 149/63 (!) 122/58  Pulse: 60 62 (!) 50 (!) 57  Resp: 17 19 16 16   Temp:  98.3 F (36.8 C) 98.2 F (36.8 C) 97.9 F (36.6 C)  TempSrc:  Oral Oral Oral  SpO2: 96% 93% 95% 96%  Weight:      Height:        Intake/Output Summary (Last 24 hours) at 08/13/2023 0756 Last data filed at 08/12/2023 2307 Gross per 24 hour  Intake --  Output 300 ml  Net -300 ml      08/12/2023    4:52 PM 08/12/2023    4:51 AM 08/03/2023    6:18 AM  Last 3 Weights  Weight (lbs) 136 lb 3.9 oz 140 lb 9.6 oz 140 lb 9.6 oz  Weight (kg) 61.8 kg 63.776 kg 63.776 kg      Telemetry    Sinus - Personally Reviewed   Physical Exam   GEN: No acute distress.   Neck: No JVD Cardiac: RRR Respiratory: Clear to auscultation bilaterally. Sternotomy without evidence of infection GI: Soft, nontender, non-distended  MS: No edema Neuro:  Nonfocal  Psych: Normal affect   Labs    High Sensitivity Troponin:   Recent Labs  Lab 08/12/23 0526 08/12/23 0727  TROPONINIHS 19* 18*     Chemistry Recent Labs  Lab  08/12/23 0526 08/13/23 0228  NA 136 135  K 3.1* 3.8  CL 98 95*  CO2 27 28  GLUCOSE 190* 254*  BUN 22 21  CREATININE 1.10* 1.12*  CALCIUM 9.2 9.2  GFRNONAA 50* 49*  ANIONGAP 11 12     Hematology Recent Labs  Lab 08/12/23 0526  WBC 9.0  RBC 3.66*  HGB 11.0*  HCT 34.3*  MCV 93.7  MCH 30.1  MCHC 32.1  RDW 14.6  PLT 414*    BNP Recent Labs  Lab 08/12/23 0526  BNP 401.3*      Radiology    DG Chest 2 View  Result Date: 08/12/2023 CLINICAL DATA:  Shortness of breath. EXAM: CHEST - 2 VIEW COMPARISON:  07/31/2023 FINDINGS: Similar left base collapse/consolidation with small effusion. Right lung clear. Cardiopericardial silhouette is at upper limits of normal for size. No acute bony abnormality. Telemetry leads overlie the chest. IMPRESSION: Similar left base collapse/consolidation with small effusion. Electronically Signed   By: Kennith Center M.D.   On: 08/12/2023 06:17     Patient Profile     82 year old female with past medical history of recent  coronary artery bypass and graft on October 24, postoperative atrial fibrillation treated with amiodarone, hypertension, hyperlipidemia, diabetes mellitus, hypothyroidism with acute diastolic congestive heart failure. Echocardiogram July 22, 2023 showed sinus rhythm with grade 1 diastolic dysfunction. Patient was discharged October 29 following recent admission for coronary artery bypass and graft.  Now readmitted with acute diastolic congestive heart failure  Assessment & Plan    1 acute diastolic congestive heart failure-patient feels much better this morning.  Will give Lasix 40 mg IV today and if she feels good later today will discharge home on Lasix 40 mg daily and spironolactone 12.5 mg daily.  Will need to check potassium and renal function in 1 week.     2 hypertension-patient's blood pressure is mildly increased.  Continue present medications including spironolactone and amlodipine.  Follow blood pressure as an  outpatient and adjust as needed.   3 coronary artery disease status post coronary bypass graft-continue aspirin and statin.   4 postoperative atrial fibrillation-patient remains in sinus rhythm.  Continue amiodarone at preadmission dose.  Would likely complete full 6 to 8 weeks following recent bypass surgery and if she maintains sinus rhythm discontinue amiodarone at that time.   5 hyperlipidemia-continue Livalo.  Check lipids and liver in 8 weeks.  She has not tolerated other statins.  Plan discharge later today on medications as outlined.  Will arrange follow-up with APP in 2 to 4 weeks.  For questions or updates, please contact [facility] Please consult [email or web address] for contact info under        Signed, Olga Millers, MD  08/13/2023, 7:56 AM

## 2023-08-16 ENCOUNTER — Telehealth (HOSPITAL_COMMUNITY): Payer: Self-pay

## 2023-08-16 NOTE — Telephone Encounter (Signed)
Pt called and she is no longer interested in cardiac rehab. She is not up for exercising and is in a lot of pain right now. She did mention " I might be ready in about 6 months." Did express to pt she has a year from the date of event to use the referral if there is a change of mind. Will close the referral for now!

## 2023-08-24 ENCOUNTER — Ambulatory Visit: Payer: Medicare Other | Attending: Cardiology | Admitting: Cardiology

## 2023-08-24 ENCOUNTER — Encounter: Payer: Self-pay | Admitting: Cardiology

## 2023-08-24 VITALS — BP 114/60 | HR 67 | Ht 60.0 in | Wt 134.0 lb

## 2023-08-24 DIAGNOSIS — E782 Mixed hyperlipidemia: Secondary | ICD-10-CM | POA: Diagnosis not present

## 2023-08-24 DIAGNOSIS — E118 Type 2 diabetes mellitus with unspecified complications: Secondary | ICD-10-CM | POA: Diagnosis not present

## 2023-08-24 DIAGNOSIS — I10 Essential (primary) hypertension: Secondary | ICD-10-CM | POA: Diagnosis not present

## 2023-08-24 DIAGNOSIS — Z951 Presence of aortocoronary bypass graft: Secondary | ICD-10-CM

## 2023-08-24 DIAGNOSIS — I5031 Acute diastolic (congestive) heart failure: Secondary | ICD-10-CM

## 2023-08-24 DIAGNOSIS — I25118 Atherosclerotic heart disease of native coronary artery with other forms of angina pectoris: Secondary | ICD-10-CM | POA: Diagnosis not present

## 2023-08-24 DIAGNOSIS — Z01818 Encounter for other preprocedural examination: Secondary | ICD-10-CM | POA: Diagnosis not present

## 2023-08-24 NOTE — Progress Notes (Signed)
Cardiology Office Note:   Date:  08/24/2023  ID:  Donna Guerrero, DOB 10-12-1940, MRN [id number] PCP: Thana Ates, MD  [facility] Providers Cardiologist:  Christell Constant, MD    History of Present Illness:   Discussed the use of AI scribe software for clinical note transcription with the patient, who gave verbal consent to proceed.  History of Present Illness   The patient is an 82 year old individual with a history of coronary artery disease, status post recent CABG x3 ((LIMA-LAD, SVG-OM, SVG-PDA), postoperative atrial fibrillation on amiodarone, hypertension, HFpEF, hyperlipidemia, type 2 diabetes, hypothyroidism, essential tremor, glaucoma, and rosacea. She was admitted 11/7 for acute congestive heart failure, presenting with lower extremity edema and shortness of breath. The patient was placed on IV Lasix during her admission, which led to significant improvement in symptoms and was discharged on Lasix 40mg  daily with spironolactone 12.5mg .  Since the second hospitalization, the patient reports feeling much better, with no recurrence of swelling and improved breathing. She denies angina, dyspnea/orthopnea, PND, palpitations, lightheadedness/dizziness. However, she has continued to experience discomfort and sensitivity of the skin in chest area, likened to the feeling of sunburn. The patient has been managing this discomfort with half a dose of oxycodone, taken in the morning and evening.  The patient also reports a fall that occurred two weeks prior to her CABG, which resulted in a hit to the back of her head. This was a mechanical fall due to a big step down. She expresses a desire to return to her active lifestyle, which included line dancing prior to surgery.  The patient has been adhering to a low-sodium diet to manage fluid levels and has been taking a stool softener to aid bowel movements. The patient expresses a desire to reduce the number of medications she is  currently taking.  The patient's blood pressure has been well-controlled, and her LDL was 58 as of her last check. She is currently on a regimen of amlodipine 5mg , spironolactone 12.5mg , metoprolol tartrate 25mg  twice a day, and Livalo 1mg  on Mondays, Wednesdays, and Fridays. The patient has previously been intolerant to high-intensity statins.      Studies Reviewed:    07/22/23 TTE  IMPRESSIONS     1. Left ventricular ejection fraction, by estimation, is 55 to 60%. The  left ventricle has normal function. The left ventricle has no regional  wall motion abnormalities. Left ventricular diastolic parameters are  consistent with Grade I diastolic  dysfunction (impaired relaxation).   2. Right ventricular systolic function is normal. The right ventricular  size is normal. Tricuspid regurgitation signal is inadequate for assessing  PA pressure.   3. The mitral valve is normal in structure. Trivial mitral valve  regurgitation. No evidence of mitral stenosis.   4. The aortic valve is tricuspid. There is moderate calcification of the  aortic valve. Aortic valve regurgitation is trivial. Aortic valve  sclerosis/calcification is present, without any evidence of aortic  stenosis. Aortic valve mean gradient measures  9.0 mmHg.   5. Aortic dilatation noted. There is mild dilatation of the ascending  aorta, measuring 39 mm.   6. The inferior vena cava is normal in size with greater than 50%  respiratory variability, suggesting right atrial pressure of 3 mmHg.   FINDINGS   Left Ventricle: Left ventricular ejection fraction, by estimation, is 55  to 60%. The left ventricle has normal function. The left ventricle has no  regional wall motion abnormalities. The left ventricular internal cavity  size  was normal in size. There is   no left ventricular hypertrophy. Left ventricular diastolic parameters  are consistent with Grade I diastolic dysfunction (impaired relaxation).   Right Ventricle: The  right ventricular size is normal. No increase in  right ventricular wall thickness. Right ventricular systolic function is  normal. Tricuspid regurgitation signal is inadequate for assessing PA  pressure.   Left Atrium: Left atrial size was normal in size.   Right Atrium: Right atrial size was normal in size.   Pericardium: There is no evidence of pericardial effusion.   Mitral Valve: The mitral valve is normal in structure. There is mild  calcification of the mitral valve leaflet(s). Mild mitral annular  calcification. Trivial mitral valve regurgitation. No evidence of mitral  valve stenosis.   Tricuspid Valve: The tricuspid valve is normal in structure. Tricuspid  valve regurgitation is trivial.   The aortic valve is tricuspid. There is moderate calcification of the  aortic valve. Aortic valve regurgitation is trivial. Aortic valve  sclerosis/calcification is present, without any evidence of aortic  stenosis.  Pulmonic Valve: The pulmonic valve was normal in structure. Pulmonic valve  regurgitation is trivial.   Aorta: The aortic root is normal in size and structure and aortic  dilatation noted. There is mild dilatation of the ascending aorta,  measuring 39 mm.   Venous: The inferior vena cava is normal in size with greater than 50%  respiratory variability, suggesting right atrial pressure of 3 mmHg.   IAS/Shunts: No atrial level shunt detected by color flow Doppler.   Risk Assessment/Calculations:              Physical Exam:   VS:  BP 114/60 (BP Location: Left Arm, Patient Position: Sitting, Cuff Size: Normal)   Pulse 67   Ht 5' (1.524 m)   Wt 134 lb (60.8 kg)   SpO2 96%   BMI 26.17 kg/m    Wt Readings from Last 3 Encounters:  08/24/23 134 lb (60.8 kg)  08/12/23 136 lb 3.9 oz (61.8 kg)  08/03/23 140 lb 9.6 oz (63.8 kg)     Physical Exam Vitals reviewed.  Constitutional:      Appearance: Normal appearance.  HENT:     Head: Normocephalic.     Nose: Nose  normal.  Eyes:     Pupils: Pupils are equal, round, and reactive to light.  Cardiovascular:     Rate and Rhythm: Normal rate and regular rhythm.     Pulses: Normal pulses.     Heart sounds: Normal heart sounds. No murmur heard.    No friction rub. No gallop.  Pulmonary:     Effort: Pulmonary effort is normal.     Breath sounds: Normal breath sounds.  Musculoskeletal:     Right lower leg: No edema.     Left lower leg: No edema.  Skin:    General: Skin is warm and dry.     Capillary Refill: Capillary refill takes less than 2 seconds.     Comments: Well healing median sternotomy incision.  Neurological:     General: No focal deficit present.     Mental Status: She is alert and oriented to person, place, and time.  Psychiatric:        Mood and Affect: Mood normal.        Behavior: Behavior normal.        Thought Content: Thought content normal.        Judgment: Judgment normal.     ASSESSMENT AND PLAN:  Assessment and Plan    Acute Congestive Heart Failure Admitted on November 7th for acute congestive heart failure, presenting with lower extremity edema and dyspnea. Symptoms improved significantly with IV Lasix. Currently on Lasix 40 mg daily and spironolactone 12.5 mg daily. Fluid levels stable since discharge. SGLT2 inhibitor not prescribed due to intolerance to Jardiance. - Continue Lasix 40 mg daily - Continue spironolactone 12.5 mg daily - Check metabolic panel to monitor renal function  Postoperative Atrial Fibrillation Postoperative atrial fibrillation following coronary artery bypass grafting. Currently on amiodarone 200 mg daily and metoprolol 25 mg twice daily. Denies palpitations. Regular rhythm on exam. Explained postoperative atrial fibrillation very common after CABG.  - Continue amiodarone 200 mg daily - Continue metoprolol 25 mg twice daily - Plan to stop amiodarone in about one month if no recurrence (6-8 weeks post surgery). - Check LFTs today    Coronary Artery Disease, Status Post CABG  Status post CABG x3 LIMA to LAD, SVG to OM, and SVG to PDA on 07/29/2023. Reports sensitivity of skin on chest secondary to surgery but is otherwise chest pain-free and denies dyspnea. Discussed that chest soreness is common post-surgery and will improve with time. - Continue aspirin - Continue metoprolol - Continue statin  Postoperative Pain Management Reports chest discomfort and sensitivity, especially to touch, similar to sunburn. Currently taking half of oxycodone in the morning and evening, but reports stomach discomfort.  - Switch from oxycodone to acetaminophen for pain management. Suspect GI upset related to oxycodone.  - Encourage gradual increase in physical activity  Hypertension Blood pressure was elevated during recent admission but is well-controlled today at 114/60. - Continue amlodipine 5 mg daily - Continue spironolactone 12.5 mg daily - Continue metoprolol tartrate 25 mg twice daily  Hyperlipidemia LDL 58 as of October 26th. Previously intolerant to high-intensity statins. - Continue Livalo 1 mg on Mondays, Wednesdays, and Fridays - Consider repeating lipid panel at next follow up.   Type 2 Diabetes Mellitus Managing diabetes well. Insulin was administered during hospital stay. Patient reports possible initiation of GLP-1 in the future. - Continue follow up with endocrinology.  General Health Maintenance Received flu shot but has not received COVID-19 vaccine. Surgeon advised against COVID-19 vaccine, but no clear contraindication identified. Encouraged to discuss COVID-19 vaccination with primary care provider.  Follow-up - Check labs today, including renal and liver function - Schedule follow-up appointment in one month           Signed, Perlie Gold, PA-C

## 2023-08-24 NOTE — Patient Instructions (Signed)
Lab Work: CMET today If you have labs (blood work) drawn today and your tests are completely normal, you will receive your results only by: MyChart Message (if you have MyChart) OR A paper copy in the mail If you have any lab test that is abnormal or we need to change your treatment, we will call you to review the results.  Follow-Up: At [facility], you and your health needs are our priority.  As part of our continuing mission to provide you with exceptional heart care, we have created designated Provider Care Teams.  These Care Teams include your primary Cardiologist (physician) and Advanced Practice Providers (APPs -  Physician Assistants and Nurse Practitioners) who all work together to provide you with the care you need, when you need it.  Your next appointment:   As Scheduled  Provider:   Eligha Bridegroom, NP

## 2023-08-25 LAB — COMPREHENSIVE METABOLIC PANEL
ALT: 11 [IU]/L (ref 0–32)
AST: 14 [IU]/L (ref 0–40)
Albumin: 4.5 g/dL (ref 3.7–4.7)
Alkaline Phosphatase: 99 [IU]/L (ref 44–121)
BUN/Creatinine Ratio: 37 — ABNORMAL HIGH (ref 12–28)
BUN: 50 mg/dL — ABNORMAL HIGH (ref 8–27)
Bilirubin Total: 0.4 mg/dL (ref 0.0–1.2)
CO2: 25 mmol/L (ref 20–29)
Calcium: 10 mg/dL (ref 8.7–10.3)
Chloride: 96 mmol/L (ref 96–106)
Creatinine, Ser: 1.36 mg/dL — ABNORMAL HIGH (ref 0.57–1.00)
Globulin, Total: 3.6 g/dL (ref 1.5–4.5)
Glucose: 200 mg/dL — ABNORMAL HIGH (ref 70–99)
Potassium: 5 mmol/L (ref 3.5–5.2)
Sodium: 134 mmol/L (ref 134–144)
Total Protein: 8.1 g/dL (ref 6.0–8.5)
eGFR: 39 mL/min/{1.73_m2} — ABNORMAL LOW (ref >59)

## 2023-08-27 ENCOUNTER — Other Ambulatory Visit: Payer: Self-pay | Admitting: Cardiology

## 2023-08-27 ENCOUNTER — Telehealth: Payer: Self-pay | Admitting: Internal Medicine

## 2023-08-27 DIAGNOSIS — Z79899 Other long term (current) drug therapy: Secondary | ICD-10-CM

## 2023-08-27 DIAGNOSIS — I10 Essential (primary) hypertension: Secondary | ICD-10-CM

## 2023-08-27 MED ORDER — FUROSEMIDE 40 MG PO TABS: 20.0000 mg | ORAL_TABLET | Freq: Every day | ORAL | Status: DC

## 2023-08-27 MED ORDER — AMLODIPINE BESYLATE 5 MG PO TABS: 5.0000 mg | ORAL_TABLET | Freq: Every day | ORAL | 3 refills | Status: DC

## 2023-08-27 NOTE — Telephone Encounter (Signed)
Left a message to call back.

## 2023-08-27 NOTE — Telephone Encounter (Signed)
Pt c/o medication issue:  1. Name of Medication:   amLODipine (NORVASC) 5 MG tablet    2. How are you currently taking this medication (dosage and times per day)?   Take 1 tablet (5 mg total) by mouth daily.    3. Are you having a reaction (difficulty breathing--STAT)? No  4. What is your medication issue? Pt states that the medication has changed and wants to make sure the correct dosage. Please advise

## 2023-08-27 NOTE — Telephone Encounter (Signed)
Left message for patient informing her she is to continue taking amlodipine 5mg  daily (per last OV note from 08/24/23).  Refill sent to [facility]. Provided office number for callback if any questions.

## 2023-08-27 NOTE — Telephone Encounter (Signed)
Patient is completely out per Andreas Blower

## 2023-08-29 ENCOUNTER — Other Ambulatory Visit (HOSPITAL_COMMUNITY): Payer: Self-pay

## 2023-08-30 ENCOUNTER — Other Ambulatory Visit (HOSPITAL_COMMUNITY): Payer: Self-pay

## 2023-08-30 ENCOUNTER — Other Ambulatory Visit: Payer: Self-pay

## 2023-09-01 ENCOUNTER — Ambulatory Visit: Payer: Medicare Other | Admitting: Physician Assistant

## 2023-09-01 DIAGNOSIS — I10 Essential (primary) hypertension: Secondary | ICD-10-CM | POA: Diagnosis not present

## 2023-09-01 DIAGNOSIS — Z79899 Other long term (current) drug therapy: Secondary | ICD-10-CM | POA: Diagnosis not present

## 2023-09-02 LAB — BASIC METABOLIC PANEL
BUN/Creatinine Ratio: 40 — ABNORMAL HIGH (ref 12–28)
BUN: 54 mg/dL — ABNORMAL HIGH (ref 8–27)
CO2: 23 mmol/L (ref 20–29)
Calcium: 10 mg/dL (ref 8.7–10.3)
Chloride: 96 mmol/L (ref 96–106)
Creatinine, Ser: 1.36 mg/dL — ABNORMAL HIGH (ref 0.57–1.00)
Glucose: 168 mg/dL — ABNORMAL HIGH (ref 70–99)
Potassium: 4.3 mmol/L (ref 3.5–5.2)
Sodium: 134 mmol/L (ref 134–144)
eGFR: 39 mL/min/{1.73_m2} — ABNORMAL LOW (ref >59)

## 2023-09-06 NOTE — Telephone Encounter (Signed)
Left a message for pt to call back if has further concerns.  There has been 3 attempts at reaching pt with no call back.

## 2023-09-16 NOTE — Progress Notes (Signed)
Cardiology Office Note:  .   Date:  09/23/2023  ID:  Donna Guerrero, DOB 08/22/1941, MRN [id number] PCP: Thana Ates, MD  [facility] Providers Cardiologist:  Christell Constant, MD    Patient Profile: .      PMH Coronary artery disease S/p CABG (LIMA-LAD, SVG-OM, SVG-PDA) Atrial fibrillation post CABG Hypertension HFpEF Hyperlipidemia Type 2 DM Hypothyroidism Essential tremor Statin intolerance  Previously seen by Dr. Eldridge Dace with history of nuclear stress test 2016 which was normal.  BP was high during the test.  She returned for evaluation of chest tightness on 06/21/2023.  Previous notes reveal history of hypertension, hyperlipidemia, and diabetes.  She had difficulty tolerating medications.  Had been on simvastatin and atorvastatin but had side effects from both.  Crestor was started but she also stopped it.  She also had difficulty with medications complicating management of her diabetes.  At office visit 06/21/2023, she reported dyspnea on exertion and chest tightness.  She noted this when walking up steep hills, however symptoms persisted.  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  She was advised to undergo coronary CTA which revealed severe LAD stenosis and multivessel disease.  She was seen in follow-up by Dr. Raynelle Jan on 07/05/2023 due to Dr. Hoyle Barr departure from the practice.  She was advised to undergo cardiac catheterization.  LHC on 07/07/2023 revealed severe three-vessel obstructive coronary artery disease with normal LV function and normal LVEDP.  Her disease was not felt amendable to PCI and she was referred for CABG.  She underwent CABG x 3 with Dr. Cliffton Asters on 07/29/2023.  She developed postoperative atrial fibrillation and was placed on amiodarone.  She was not placed on anticoagulation.  She was discharged post CABG on 08/03/2023.  Return admission 11/7-11/8/24 for LE edema and shortness of breath.  She reported she was  initially doing very well after discharge but started to notice some swelling in her feet around 08/08/2023.  Saw PCP on 11/4 who advised her to continue Lasix.  She called PCP 11/6 who recommended increasing Lasix to twice daily.  She also reported abdominal distention and a 10 pound weight gain since hospital discharge.  When she awoke on the morning of admission, she felt like she could not take a deep breath.  Upon arrival to ED she was markedly hypertensive at 189/117.  EKG showed sinus bradycardia at 58 bpm with first-degree AV block and nonspecific ST/T wave changes.  Initial troponin minimally elevated at 19.  BNP elevated at four 1.3.  CXR showed similar left base collapse/consolidation with small effusion.  Potassium was 3.1.  She was admitted for acute diastolic CHF.  She was diuresed with IV Lasix with significant improvement in symptoms.  She was discharged home on Lasix 40 mg daily and spironolactone 12.5 mg daily.  Seen in follow-up in clinic by Perlie Gold, PA, on 08/24/2023.  She reported she was feeling much better with no recurrence of swelling or shortness of breath.  She was having chest discomfort like a sunburn and had been managing with half a dose of oxycodone taken in the morning and evening.  She reported a fall that occurred 2 weeks prior to CABG which resulted in a hit to the back of her head.  She missed a step.  She expressed a desire to reduce the number of medications she was taking.  She was encouraged to stop the oxycodone and use Tylenol for pain relief.  She was advised  to discontinue amiodarone in 1 month if no recurrence of A-fib. One month follow-up was recommended  CMP at that time revealed K 5.1 and creatinine 1.36. She was advised to reduce Lasix to 20 mg daily and discontinue her potassium supplement.  Repeat BMP 09/01/23 revealed stable creatinine at 1.36 and potassium 4.3.        History of Present Illness: .   Donna Guerrero is a very pleasant 82 y.o. female  who is here today for follow-up of chronic HFpEF. She continues to have post-surgical discomfort which radiates across her chest which she manages with Tylenol every six hours. No exertional chest pain. Feels that she has gained strength and is becoming more active at home since her surgery. No shortness of breath. Feels that her tremors have worsened slightly since switching from propranolol to metoprolol, most notably when writing. She asks about refilling amiodarone. She was advised to stop once she completed her current prescription. Her daughter has been giving her Lasix 40 mg daily rather than 20 mg. Appetite has improved over the past month. She is not monitoring weight daily. Feels that recent weight gain is related to increased appetite and food consumption rather than fluid retention. She sleeps comfortably with one pillow and reports no issues with breathing while lying down.  She denies dyspnea, orthopnea, PND, edema, presyncope, syncope.   Discussed the use of AI scribe software for clinical note transcription with the patient, who gave verbal consent to proceed.   ROS: See HPI       Studies Reviewed: .        Risk Assessment/Calculations:             Physical Exam:   VS:  BP 122/64   Pulse 64   Ht 5' (1.524 m)   Wt 139 lb 9.6 oz (63.3 kg)   SpO2 98%   BMI 27.26 kg/m    Wt Readings from Last 3 Encounters:  09/23/23 139 lb 9.6 oz (63.3 kg)  08/24/23 134 lb (60.8 kg)  08/12/23 136 lb 3.9 oz (61.8 kg)    GEN: Well nourished, well developed in no acute distress NECK: No JVD; No carotid bruits CARDIAC: RRR, no murmurs, rubs, gallops RESPIRATORY:  Clear to auscultation without rales, wheezing or rhonchi  ABDOMEN: Soft, non-tender, non-distended EXTREMITIES:  No edema; No deformity     ASSESSMENT AND PLAN: .    CAD s/p CABG: S/p CABG x 3 on 07/29/23. She continues to have mild chest discomfort that radiates across her chest that seems appropriate for stage of recovery.  No  anginal symptoms or worsening discomfort with exertion. Continues to gradually increase activity and feels an improvement in her strength and stamina. Is interested in cardiac rehab. She is overdue for follow-up with cardiac surgery. We have contacted Dr. Lucilla Lame office to get patient scheduled.  Will await approval from CT surgery for referral to cardiac rehab.  LDL is well-controlled on Livalo. No bleeding concerns. Continue aspirin until follow-up appointment with surgery.   HFpEF: Return admission for acute HFpEF 08/12/23. Has been feeling well since hospital discharge with no further symptoms of shortness of breath, edema, orthopnea, or PND. Continues to gradually increase activity. She appears euvolemic on exam today.  Weight has increased but she feels this is secondary to increased appetite.  Advised her to monitor her daily weight and notify us for weight gain > 2 lbs in 24 hours or >5 lbs in one week. . Continue Lasix 20 mg daily.  Continue spironolactone and metoprolol.  We will get BMP today to ensure stable renal function and electrolytes.  Hyperlipidemia LDL goal < 55/Statin intolerance: LDL 58, triglycerides 61, HDL 43 on 07/31/23. Prior intolerance to multiple statins, on Livalo 1 gram 3 days per week. LDL is at goal. Continue Livalo.  Encouraged heart healthy diet like the Mediterranean diet and regular physical activity.  Type 2 DM: A1c 6.9% on 07/28/2023. We discussed healthy diet to improve blood sugar. She has been intolerant to multiple diabetes medications. Management per endocrinologist.   Postoperative atrial fibrillation: No further evidence of atrial fibrillation during return admission 08/2023.  Have advised she can discontinue amiodarone. Report any concerning palpitations or episodes of tachycardia.  Continue metoprolol.  Tremors: She reports increased shakiness, particularly when writing since switching from propranolol to metoprolol. Encouraged her to notify us if tremors  worsen. Could consider propranolol 10 mg PRN in addition to metoprolol.        Dispo: 3 months with Dr. Izora Ribas  Signed, Eligha Bridegroom, NP-C

## 2023-09-23 ENCOUNTER — Ambulatory Visit: Payer: Medicare Other | Attending: Nurse Practitioner | Admitting: Nurse Practitioner

## 2023-09-23 ENCOUNTER — Encounter: Payer: Self-pay | Admitting: Nurse Practitioner

## 2023-09-23 VITALS — BP 122/64 | HR 64 | Ht 60.0 in | Wt 139.6 lb

## 2023-09-23 DIAGNOSIS — I2510 Atherosclerotic heart disease of native coronary artery without angina pectoris: Secondary | ICD-10-CM

## 2023-09-23 DIAGNOSIS — I5032 Chronic diastolic (congestive) heart failure: Secondary | ICD-10-CM

## 2023-09-23 DIAGNOSIS — I4891 Unspecified atrial fibrillation: Secondary | ICD-10-CM

## 2023-09-23 DIAGNOSIS — I9789 Other postprocedural complications and disorders of the circulatory system, not elsewhere classified: Secondary | ICD-10-CM | POA: Diagnosis not present

## 2023-09-23 DIAGNOSIS — E785 Hyperlipidemia, unspecified: Secondary | ICD-10-CM | POA: Diagnosis not present

## 2023-09-23 DIAGNOSIS — Z789 Other specified health status: Secondary | ICD-10-CM | POA: Diagnosis not present

## 2023-09-23 DIAGNOSIS — I5031 Acute diastolic (congestive) heart failure: Secondary | ICD-10-CM | POA: Diagnosis not present

## 2023-09-23 DIAGNOSIS — I25118 Atherosclerotic heart disease of native coronary artery with other forms of angina pectoris: Secondary | ICD-10-CM | POA: Diagnosis not present

## 2023-09-23 DIAGNOSIS — E118 Type 2 diabetes mellitus with unspecified complications: Secondary | ICD-10-CM | POA: Diagnosis not present

## 2023-09-23 DIAGNOSIS — Z79899 Other long term (current) drug therapy: Secondary | ICD-10-CM

## 2023-09-23 DIAGNOSIS — R251 Tremor, unspecified: Secondary | ICD-10-CM

## 2023-09-23 DIAGNOSIS — Z951 Presence of aortocoronary bypass graft: Secondary | ICD-10-CM

## 2023-09-23 DIAGNOSIS — I10 Essential (primary) hypertension: Secondary | ICD-10-CM

## 2023-09-23 LAB — BASIC METABOLIC PANEL
BUN/Creatinine Ratio: 30 — ABNORMAL HIGH (ref 12–28)
BUN: 33 mg/dL — ABNORMAL HIGH (ref 8–27)
CO2: 24 mmol/L (ref 20–29)
Calcium: 9.5 mg/dL (ref 8.7–10.3)
Chloride: 102 mmol/L (ref 96–106)
Creatinine, Ser: 1.1 mg/dL — ABNORMAL HIGH (ref 0.57–1.00)
Glucose: 154 mg/dL — ABNORMAL HIGH (ref 70–99)
Potassium: 4.2 mmol/L (ref 3.5–5.2)
Sodium: 140 mmol/L (ref 134–144)
eGFR: 50 mL/min/{1.73_m2} — ABNORMAL LOW (ref >59)

## 2023-09-23 MED ORDER — FUROSEMIDE 20 MG PO TABS: 20.0000 mg | ORAL_TABLET | Freq: Every day | ORAL | 3 refills | Status: DC

## 2023-09-23 MED ORDER — POTASSIUM CHLORIDE CRYS ER 10 MEQ PO TBCR: 10.0000 meq | EXTENDED_RELEASE_TABLET | Freq: Every day | ORAL | 3 refills | Status: DC

## 2023-09-23 NOTE — Patient Instructions (Signed)
Medication Instructions:   DECREASE Lasix one (1) tablet by mouth ( 20 mg) daily.   START K-dur one (1) tablet by mouth 10 mEq daily.   *If you need a refill on your cardiac medications before your next appointment, please call your pharmacy*   Lab Work:  TODAY!! BMET  If you have labs (blood work) drawn today and your tests are completely normal, you will receive your results only by: MyChart Message (if you have MyChart) OR A paper copy in the mail If you have any lab test that is abnormal or we need to change your treatment, we will call you to review the results.   Testing/Procedures:  None ordered.   Follow-Up: At [facility], you and your health needs are our priority.  As part of our continuing mission to provide you with exceptional heart care, we have created designated Provider Care Teams.  These Care Teams include your primary Cardiologist (physician) and Advanced Practice Providers (APPs -  Physician Assistants and Nurse Practitioners) who all work together to provide you with the care you need, when you need it.  We recommend signing up for the patient portal called "MyChart".  Sign up information is provided on this After Visit Summary.  MyChart is used to connect with patients for Virtual Visits (Telemedicine).  Patients are able to view lab/test results, encounter notes, upcoming appointments, etc.  Non-urgent messages can be sent to your provider as well.   To learn more about what you can do with MyChart, go to [email or web address].    Your next appointment:   4 month(s)  Provider:   Christell Constant, MD     Other Instructions      Mediterranean Diet  Why follow it? Research shows. Those who follow the Mediterranean diet have a reduced risk of heart disease  The diet is associated with a reduced incidence of Parkinson's and Alzheimer's diseases People following the diet may have longer life expectancies and lower rates of chronic  diseases  The Dietary Guidelines for Americans recommends the Mediterranean diet as an eating plan to promote health and prevent disease  What Is the Mediterranean Diet?  Healthy eating plan based on typical foods and recipes of Mediterranean-style cooking The diet is primarily a plant based diet; these foods should make up a majority of meals   Starches - Plant based foods should make up a majority of meals - They are an important sources of vitamins, minerals, energy, antioxidants, and fiber - Choose whole grains, foods high in fiber and minimally processed items  - Typical grain sources include wheat, oats, barley, corn, brown rice, bulgar, farro, millet, polenta, couscous  - Various types of beans include chickpeas, lentils, fava beans, black beans, white beans   Fruits  Veggies - Large quantities of antioxidant rich fruits & veggies; 6 or more servings  - Vegetables can be eaten raw or lightly drizzled with oil and cooked  - Vegetables common to the traditional Mediterranean Diet include: artichokes, arugula, beets, broccoli, brussel sprouts, cabbage, carrots, celery, collard greens, cucumbers, eggplant, kale, leeks, lemons, lettuce, mushrooms, okra, onions, peas, peppers, potatoes, pumpkin, radishes, rutabaga, shallots, spinach, sweet potatoes, turnips, zucchini - Fruits common to the Mediterranean Diet include: apples, apricots, avocados, cherries, clementines, dates, figs, grapefruits, grapes, melons, nectarines, oranges, peaches, pears, pomegranates, strawberries, tangerines  Fats - Replace butter and margarine with healthy oils, such as olive oil, canola oil, and tahini  - Limit nuts to no more than a handful a day  -  Nuts include walnuts, almonds, pecans, pistachios, pine nuts  - Limit or avoid candied, honey roasted or heavily salted nuts - Olives are central to the Mediterranean diet - can be eaten whole or used in a variety of dishes   Meats Protein - Limiting red meat: no more  than a few times a month - When eating red meat: choose lean cuts and keep the portion to the size of deck of cards - Eggs: approx. 0 to 4 times a week  - Fish and lean poultry: at least 2 a week  - Healthy protein sources include, chicken, [place], lean beef, lamb - Increase intake of seafood such as tuna, salmon, trout, mackerel, shrimp, scallops - Avoid or limit high fat processed meats such as sausage and bacon  Dairy - Include moderate amounts of low fat dairy products  - Focus on healthy dairy such as fat free yogurt, skim milk, low or reduced fat cheese - Limit dairy products higher in fat such as whole or 2% milk, cheese, ice cream  Alcohol - Moderate amounts of red wine is ok  - No more than 5 oz daily for women (all ages) and men older than age 54  - No more than 10 oz of wine daily for men younger than 42  Other - Limit sweets and other desserts  - Use herbs and spices instead of salt to flavor foods  - Herbs and spices common to the traditional Mediterranean Diet include: basil, bay leaves, chives, cloves, cumin, fennel, garlic, lavender, marjoram, mint, oregano, parsley, pepper, rosemary, sage, savory, sumac, tarragon, thyme   It's not just a diet, it's a lifestyle:  The Mediterranean diet includes lifestyle factors typical of those in the region  Foods, drinks and meals are best eaten with others and savored Daily physical activity is important for overall good health This could be strenuous exercise like running and aerobics This could also be more leisurely activities such as walking, housework, yard-work, or taking the stairs Moderation is the key; a balanced and healthy diet accommodates most foods and drinks Consider portion sizes and frequency of consumption of certain foods   Meal Ideas & Options:  Breakfast:  Whole wheat toast or whole wheat English muffins with peanut butter & hard boiled egg Steel cut oats topped with apples & cinnamon and skim milk  Fresh fruit:  banana, strawberries, melon, berries, peaches  Smoothies: strawberries, bananas, greek yogurt, peanut butter Low fat greek yogurt with blueberries and granola  Egg white omelet with spinach and mushrooms Breakfast couscous: whole wheat couscous, apricots, skim milk, cranberries  Sandwiches:  Hummus and grilled vegetables (peppers, zucchini, squash) on whole wheat bread   Grilled chicken on whole wheat pita with lettuce, tomatoes, cucumbers or tzatziki  [place] salad on whole wheat bread: tuna salad made with greek yogurt, olives, red peppers, capers, green onions Garlic rosemary lamb pita: lamb sauted with garlic, rosemary, salt & pepper; add lettuce, cucumber, greek yogurt to pita - flavor with lemon juice and black pepper  Seafood:  Mediterranean grilled salmon, seasoned with garlic, basil, parsley, lemon juice and black pepper Shrimp, lemon, and spinach whole-grain pasta salad made with low fat greek yogurt  Seared scallops with lemon orzo  Seared tuna steaks seasoned salt, pepper, coriander topped with tomato mixture of olives, tomatoes, olive oil, minced garlic, parsley, green onions and cappers  Meats:  Herbed greek chicken salad with kalamata olives, cucumber, feta  Red bell peppers stuffed with spinach, bulgur, lean ground beef (or  lentils) & topped with feta   Kebabs: skewers of chicken, tomatoes, onions, zucchini, squash  [place] burgers: made with red onions, mint, dill, lemon juice, feta cheese topped with roasted red peppers Vegetarian Cucumber salad: cucumbers, artichoke hearts, celery, red onion, feta cheese, tossed in olive oil & lemon juice  Hummus and whole grain pita points with a greek salad (lettuce, tomato, feta, olives, cucumbers, red onion) Lentil soup with celery, carrots made with vegetable broth, garlic, salt and pepper  Tabouli salad: parsley, bulgur, mint, scallions, cucumbers, tomato, radishes, lemon juice, olive oil, salt and pepper.  Tackling Obesity with  Lifestyle Changes  Obesity- What is it? And What can we do about it?  Obesity is a chronic complex disease defined as excessive fat deposits that can have a negative effect on our health. It can lead to many other diseases including type 2 diabetes.  Weight gain occurs when the amount of energy (calories) we consume is greater than the amount we use.  When our energy output is greater than our energy input we lose weight. The basic concept is simple, but in reality, it's much more complicated.  Unfortunately, in some people, our bodies have many ways it can compensate when we try to eat less and move more which can prevent [place] from changing our weight. This can lead to some people having a much more difficult time losing weight even when they put healthy habits into practice. This can be frustrating. We want to focus on healthy habits, physical activity and how we feel, and less the number on the scale.  Food As Energy  Calories  Calories is just a unit of measurement for energy.  Counting calories is not required to lose weight but counting for a short period of time can:   help you learn good portion sizes   Learn what your true energy needs are.   Help you be more aware of your snacking or grazing habits  To help calculate how many calories you should be eating, the NIH has a great body weight planner calculator at BeverageBuggy.si  Types of Energy Expenditure  Basal Metabolic Rate (BMR) Energy that our bodies use to preform everyday tasks. More muscle mass through resistance training can increase this a small amount  Thermic Effect of Food The amount of energy that it takes to breakdown the food we eat. This will be highest when we eat protein and fiber rich foods  Exercise Energy Expenditure The amount of energy used during formal exercise (walking, biking, weightlifting)  Non-exercise activity thermogenesis (NEAT) The amount of energy spent on activities that are not  formal exercise (standing, fidgeting). Therefore, it is not only important to do formal exercise but also move around throughout the day.  Managing The Meal  Macro nutrients (carbohydrates, fats and protein, fiber, water)  Micronutrients (vitamins, minerals)  Dietary Fiber  Benefits Examples Cautions  Soluble fiber  Decreases cholesterol  improve blood sugar control,  Feeds our gut bacteria  Allows [place] to feel fuller for longer so we eat less  fruits  oats  barley  legumes  peas  Beans  vegetables (broccoli) and root vegetables (carrots) Add fiber into your diet slowly and be sure to drink at least 8 cups of water a day. This will help limit gas, bloating, diarrhea, or constipation.  Insoluble Fiber  Improves digestive health by making stool easier to pass  Allows [place] to feel fuller for longer so we eat less  whole grains  nuts  seeds  skin of fruit  vegetables (green beans, zucchini, cauliflower)  Tricks to add more fiber to your diet   Add beans (pinto, kidney, lima, navy and garbanzo) to salads, ground meat or brown rice   Add nuts or seeds and or fresh/frozen fruit to yogurt, cottage cheese, salads or steel cut oats   Cut up vegetables and eat with hummus   Look for unsweetened whole grain cereals with at least 5g of fiber per serving   Switch to whole grain bread. Look for bread that has whole grain flour as the first ingredient and has more fiber than carbs if you were to multiple the fiber x 10.   Try bulgar, barely, quinoa, buckwheat, brown rice wild rice instead of white rice   Keep frozen vegetables on hand to add to dishes or soups  Meal Planning:  Meal planning is the key to setting you up for success. Here are some examples of healthy meal options.  Breakfast  Option 1: Omelette with vegetables (1 egg, spinach, mushrooms, or other vegetable of your choice), 2 slices whole-grain toast, tip of thumb size butter or soft margarine,  cup low-fat  milk or yogurt  Option 2: steel-cut rolled oats (? cup dry), 1 tbsp peanut butter added to cooked oats,  cup low-fat milk.  Option 3: 2 slices whole-grain or rye toast with avocado spread ( small avocado mased with herbs and pepper to taste), 1 poached egg or [place] up (cooked to your liking)  Option 4:  cup plain 0% [place] yogurt topped with  cup berries and  cup walnuts or almonds, 2 slices whole-grain or rye toast, tip of thumb size soft margarine/butter  Lunch:  Option 1: 2 cups red lentil soup, green salad with 1 tbsp homemade vinaigrette (extra virgin olive oil and vinegar of choice plus spices)  Option 2: 3 oz. roasted chicken, 2 slices whole-grain bread, 2 tsp mayonnaise, mustard, lettuce, tomato if desired, 1 fruit (example: medium-sized apple or small pear)  Option 3: 3 oz. tuna packed in water, 1 whole-wheat pita (6 inch), 2 tsp mayonnaise, lettuce, tomato, or other non-starchy vegetable of your choice, 1 fruit (example: medium-sized apple or small pear)  Option 4: 1 serving of garden veggie buddha bowl with lentils and tahini sauce and 1 cup berries topped with  cup plain 0% Greek yogurt  Dinner:  Option 1: 1 serving roasted cauliflower salad, 3-4 oz. grilled or baked pork loin chop, 1/2 cup mashed potato, or brown rice or quinoa  Option 2: 1 serving fish (baked, grilled or air fried), green salad, 1 tbsp homemade vinaigrette,  cup cooked couscous  Option 3: 1 cup cooked whole grained pasta (example: spaghetti, spirals, macaroni),  cup favorite pasta sauce (preferably homemade), 3-4 oz. grilled or baked chicken, green salad, 1 tbsp homemade vinaigrette  Option 4: 1 serving oven roasted salmon,  cup mashed sweet potato or couscous or brown rice or quinoa, broccoli (steamed or roasted)  Healthy snacks:   Carrots or celery with 1 tbsp of hummus   1 medium-sized fruit (apple or orange)   1 cup plain 0% [place] yogurt with  cup berries   Half apple, sliced,  with 1 tbsp (15 mL) peanut or almond butter  Dining out:  Eating away from home has become a part of many people's lifestyle. Making healthy choices when you are eating out is important too. Portion size is an important part of healthy choices. Most branded fast-food places provide calories, sodium, and fat  content for their menu items. www.calorieking.com would be great resource to find nutrition facts for your favorite brands and fast-food restaurants. Company specific website can be Chief Technology Officer for nutrition information for their items. (e.g. www.mcdonalds.com or www.nutritionix.com/[place]/menu/premium)  Here are some tips to help you make wise food choices when you are dining out.  Chose more often Avoid  Beverages   Choose more often: Water, low fat milk  Sugar-free/diet drinks  Unsweet tea or coffee    Avoid: Milkshakes, fruit drinks, regular pop  Alcohol, specialty drinks (e.g. iced cappuccino)  Fast food  Choose more often:  Garden salad  Mini subs, pita sandwiches ect with extra vegetables  plain burgers, grilled chicken  Vegetarian or cheese pizza with whole-grain crust    Avoid: Burgers/sandwiches with bacon, cheese, and high-fat sauces  [place] fries, fried chicken, fried fish, poutine, hash browns  Pizza with processed meats  Starters   Choose more often: Raw vegetables, salads (garden, spinach, fruit)  clear or vegetable soups  Seafood cocktail  Whole-grain breads and rolls    Avoid: Salads with high-fat dressings or toppings  Creamy soups  Wings, egg rolls  onion rings, nachos  White or garlic bread  Main courses Grains & Starches (amount equal to  of your plate)  Choose more often:  Oatmeal, high-fiber/lower-sugar cereals  Whole-grain breads, rice, pasta, barley, couscous  Sweet potatoes    Avoid: Sugary, low-fiber cereals  Large bagels, muffins, croissants, white bread  [place] fries, hash browns, fried rice   Meat and alternative  (amount equal to  of your plate)  Choose more often:  Lean meats, poultry, fish, eggs, low-fat cheese  Tofu, vegetable protein Legumes (e.g. lentils, chickpeas, beans)    Avoid: High-salt and/or high-fat meats (e.g. ribs, wings, sausages, wieners, processed lunch meats, imposter meats)   Vegetables (amount equal to  of your plate)  Choose more often:  Salads ([place], garden, spinach), plain vegetables   Avoid:  Salads with creamy, high-fat dressings and   Vegetables on sandwiches ect toppings like bacon bits, croutons, cheese  Desserts  Choose more often:  Fresh fruit, frozen yogourt, skim milk latte    Avoid: Cakes, pies, pastries, ice cream, cheesecake

## 2023-09-30 DIAGNOSIS — E039 Hypothyroidism, unspecified: Secondary | ICD-10-CM | POA: Diagnosis not present

## 2023-10-04 ENCOUNTER — Other Ambulatory Visit: Payer: Self-pay | Admitting: Physician Assistant

## 2023-10-08 ENCOUNTER — Other Ambulatory Visit: Payer: Self-pay | Admitting: Thoracic Surgery (Cardiothoracic Vascular Surgery)

## 2023-10-08 DIAGNOSIS — I25118 Atherosclerotic heart disease of native coronary artery with other forms of angina pectoris: Secondary | ICD-10-CM

## 2023-10-11 ENCOUNTER — Ambulatory Visit (INDEPENDENT_AMBULATORY_CARE_PROVIDER_SITE_OTHER): Payer: Self-pay | Admitting: Surgical

## 2023-10-11 ENCOUNTER — Ambulatory Visit
Admission: RE | Admit: 2023-10-11 | Discharge: 2023-10-11 | Disposition: A | Discharge status: Home / Self Care | Payer: Medicare Other | Source: Ambulatory Visit | Attending: Thoracic Surgery (Cardiothoracic Vascular Surgery) | Admitting: Thoracic Surgery (Cardiothoracic Vascular Surgery)

## 2023-10-11 VITALS — BP 149/79 | HR 56 | Resp 18 | Ht 60.0 in | Wt 141.0 lb

## 2023-10-11 DIAGNOSIS — Z951 Presence of aortocoronary bypass graft: Secondary | ICD-10-CM | POA: Diagnosis not present

## 2023-10-11 DIAGNOSIS — I25118 Atherosclerotic heart disease of native coronary artery with other forms of angina pectoris: Secondary | ICD-10-CM

## 2023-10-11 NOTE — Progress Notes (Signed)
 [address]       [place] [address]             [phone]      Shernita  ELIENAI GAILEY [facility] Medical Record #[id number] Date of Birth: 02/16/1941  Referring: Jordan, Peter M, MD Primary Care: Dwight Trula SQUIBB, MD Primary Cardiologist: Stanly DELENA Leavens, MD   Chief Complaint:   POST OP FOLLOW UP    07/29/2023 Patient:  Donna  LOISE Guerrero Pre-Op Dx: CAD HTN   HLP Post-op Dx:  same Procedure: CABG X 3.  LIMA LAD, RSVG PDA, OM   Endoscopic greater saphenous vein harvest on the right     Surgeon and Role:      * Lightfoot, Linnie KIDD, MD - Primary    * B. Stehler , PA-C - assisting    History of Present Illness:    The patient is an 83 year old female status post the above described procedures in the office on today's date and routine postsurgical follow-up.  She is making excellent progress.  She is taking Tylenol  twice daily for pain but not requiring anything stronger.  Not having any anginal equivalents or shortness of breath.  She does have some lower extremity edema.  She is not having any fevers, chills or other constitutional symptoms.  Overall she is quite pleased with her progress.      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   Hyperlipidemia    INTOLERANT OF STATINS   Hypertension    Hypothyroidism    Rosacea    Whooping cough      Social History   Tobacco Use  Smoking Status Never  Smokeless Tobacco Never    Social History   Substance and Sexual Activity  Alcohol Use Yes   Alcohol/week: 2.0 standard drinks of alcohol   Types: 2 Glasses of wine per week     Allergies  Allergen Reactions   Welchol  [Colesevelam  Hcl] Other (See Comments)    Tablets were too big, CAUSED FLU-LIKE SYMPTOMS   Amaryl [Glimepiride] Other (See Comments)    headache   Atorvastatin      MYALGIAS   Codeine Nausea Only   Crestor [Rosuvastatin]     Myalgias   Empagliflozin Other (See Comments)    **Jardiance** leg pain   Ezetimibe Nausea Only    indigestion   Metformin Hcl Other (See Comments)    Unknown reaction   Simvastatin     Myalgia     Current Outpatient Medications  Medication Sig Dispense Refill   acetaminophen  (TYLENOL ) 500 MG tablet Take 500 mg by mouth every 6 (six) hours as needed for moderate pain.     amLODipine  (NORVASC ) 5 MG tablet Take 1 tablet (5 mg total) by mouth daily. 90 tablet 3   aspirin  EC 325 MG tablet Take 1 tablet (325 mg total) by mouth daily.     dorzolamide -timolol  (COSOPT ) 2-0.5 % ophthalmic solution Place 1 drop into both eyes 2 (two) times daily.     furosemide  (LASIX ) 20 MG tablet Take 1 tablet (20 mg total) by mouth daily. 90 tablet 3   glucose blood test strip 1 each by Other route as needed for other. Use as instructed     levothyroxine  (SYNTHROID ) 88 MCG tablet Take 88 mcg  by mouth daily before breakfast.     LIVALO 1 MG TABS Take 1 mg by mouth every Monday, Wednesday, and Friday.     metoprolol  tartrate (LOPRESSOR ) 25 MG tablet Take 1 tablet (25 mg total) by mouth 2 (two) times daily. 60 tablet 1   nitroGLYCERIN  (NITROSTAT ) 0.4 MG SL tablet Place 1 tablet (0.4 mg total) under the tongue every 5 (five) minutes x 3 doses as needed for chest pain. 25 tablet 2   oxyCODONE  (OXY IR/ROXICODONE ) 5 MG immediate release tablet Take 1 tablet (5 mg total) by mouth every 6 (six) hours as needed for severe pain (pain score 7-10). 28 tablet 0   potassium chloride  (KLOR-CON  M) 10 MEQ tablet Take 1 tablet (10 mEq total) by mouth daily. 90 tablet 3   spironolactone  (ALDACTONE ) 25 MG tablet Take 0.5 tablets (12.5 mg total) by mouth daily. 15 tablet 2   VYZULTA  0.024 % SOLN Place 1 drop into both eyes at bedtime.     No current facility-administered medications for this visit.       Physical Exam: BP (!) 149/79   Pulse (!) 56   Resp 18    Ht 5' (1.524 m)   Wt 141 lb (64 kg)   SpO2 99% Comment: RA  BMI 27.54 kg/m   General appearance: alert, cooperative, and no distress Heart: regular rate and rhythm Lungs: clear to auscultation bilaterally Abdomen: Benign Extremities: Bilateral ankle edema Incisions well-healed without evidence of infection  Diagnostic Studies & Laboratory data:     Recent Radiology Findings:   No results found.    Recent Lab Findings: Lab Results  Component Value Date   WBC 9.0 08/12/2023   HGB 11.0 (L) 08/12/2023   HCT 34.3 (L) 08/12/2023   PLT 414 (H) 08/12/2023   GLUCOSE 154 (H) 09/23/2023   CHOL 113 07/31/2023   TRIG 61 07/31/2023   HDL 43 07/31/2023   LDLCALC 58 07/31/2023   ALT 11 08/24/2023   AST 14 08/24/2023   NA 140 09/23/2023   K 4.2 09/23/2023   CL 102 09/23/2023   CREATININE 1.10 (H) 09/23/2023   BUN 33 (H) 09/23/2023   CO2 24 09/23/2023   INR 1.5 (H) 07/29/2023   HGBA1C 6.9 (H) 07/28/2023   I reviewed her chest x-ray and there are no concerning effusions or infiltrates.  Sternal wires are intact.   Assessment / Plan: Excellent ongoing recovery.  I did not make any changes to her current medication regimen.  We will follow-up on a as needed basis for any surgically related needs or at request.      Medication Changes: No orders of the defined types were placed in this encounter.     Alric Geise E Mishika Flippen, PA-C  10/11/2023 3:25 PM

## 2023-10-11 NOTE — Patient Instructions (Signed)
Discussed activity progression including driving. °

## 2023-10-19 ENCOUNTER — Other Ambulatory Visit: Payer: Self-pay | Admitting: Student

## 2023-10-19 DIAGNOSIS — E039 Hypothyroidism, unspecified: Secondary | ICD-10-CM | POA: Diagnosis not present

## 2023-10-19 DIAGNOSIS — E1122 Type 2 diabetes mellitus with diabetic chronic kidney disease: Secondary | ICD-10-CM | POA: Diagnosis not present

## 2023-10-19 DIAGNOSIS — E782 Mixed hyperlipidemia: Secondary | ICD-10-CM | POA: Diagnosis not present

## 2023-10-19 DIAGNOSIS — I2510 Atherosclerotic heart disease of native coronary artery without angina pectoris: Secondary | ICD-10-CM | POA: Diagnosis not present

## 2023-11-01 DIAGNOSIS — H401123 Primary open-angle glaucoma, left eye, severe stage: Secondary | ICD-10-CM | POA: Diagnosis not present

## 2023-11-01 DIAGNOSIS — H401111 Primary open-angle glaucoma, right eye, mild stage: Secondary | ICD-10-CM | POA: Diagnosis not present

## 2023-11-10 DIAGNOSIS — G250 Essential tremor: Secondary | ICD-10-CM | POA: Diagnosis not present

## 2023-11-10 DIAGNOSIS — I2510 Atherosclerotic heart disease of native coronary artery without angina pectoris: Secondary | ICD-10-CM | POA: Diagnosis not present

## 2023-11-10 DIAGNOSIS — Z951 Presence of aortocoronary bypass graft: Secondary | ICD-10-CM | POA: Diagnosis not present

## 2023-11-10 DIAGNOSIS — I10 Essential (primary) hypertension: Secondary | ICD-10-CM | POA: Diagnosis not present

## 2023-11-10 DIAGNOSIS — I5032 Chronic diastolic (congestive) heart failure: Secondary | ICD-10-CM | POA: Diagnosis not present

## 2023-11-30 DIAGNOSIS — E1122 Type 2 diabetes mellitus with diabetic chronic kidney disease: Secondary | ICD-10-CM | POA: Diagnosis not present

## 2023-11-30 DIAGNOSIS — E039 Hypothyroidism, unspecified: Secondary | ICD-10-CM | POA: Diagnosis not present

## 2024-01-18 DIAGNOSIS — E039 Hypothyroidism, unspecified: Secondary | ICD-10-CM | POA: Diagnosis not present

## 2024-01-18 DIAGNOSIS — E1122 Type 2 diabetes mellitus with diabetic chronic kidney disease: Secondary | ICD-10-CM | POA: Diagnosis not present

## 2024-01-18 DIAGNOSIS — E782 Mixed hyperlipidemia: Secondary | ICD-10-CM | POA: Diagnosis not present

## 2024-01-18 DIAGNOSIS — I2510 Atherosclerotic heart disease of native coronary artery without angina pectoris: Secondary | ICD-10-CM | POA: Diagnosis not present

## 2024-01-24 ENCOUNTER — Encounter: Payer: Self-pay | Admitting: Internal Medicine

## 2024-01-24 ENCOUNTER — Ambulatory Visit: Payer: Medicare Other | Attending: Internal Medicine | Admitting: Internal Medicine

## 2024-01-24 VITALS — BP 118/60 | HR 62 | Ht 60.0 in | Wt 142.0 lb

## 2024-01-24 DIAGNOSIS — I2510 Atherosclerotic heart disease of native coronary artery without angina pectoris: Secondary | ICD-10-CM

## 2024-01-24 DIAGNOSIS — I5032 Chronic diastolic (congestive) heart failure: Secondary | ICD-10-CM | POA: Diagnosis not present

## 2024-01-24 DIAGNOSIS — Z951 Presence of aortocoronary bypass graft: Secondary | ICD-10-CM

## 2024-01-24 DIAGNOSIS — I10 Essential (primary) hypertension: Secondary | ICD-10-CM

## 2024-01-24 DIAGNOSIS — E785 Hyperlipidemia, unspecified: Secondary | ICD-10-CM

## 2024-01-24 DIAGNOSIS — E876 Hypokalemia: Secondary | ICD-10-CM

## 2024-01-24 NOTE — Patient Instructions (Addendum)
 Lab Work: Bmp Fasting lipid panel   If you have labs (blood work) drawn today and your tests are completely normal, you will receive your results only by: MyChart Message (if you have MyChart) OR A paper copy in the mail If you have any lab test that is abnormal or we need to change your treatment, we will call you to review the results.  Follow-Up: At [facility], you and your health needs are our priority.  As part of our continuing mission to provide you with exceptional heart care, our providers are all part of one team.  This team includes your primary Cardiologist (physician) and Advanced Practice Providers or APPs (Physician Assistants and Nurse Practitioners) who all work together to provide you with the care you need, when you need it.  Your next appointment:   6 month(s)  Provider:   Slater Duncan   We recommend signing up for the patient portal called "MyChart".  Sign up information is provided on this After Visit Summary.  MyChart is used to connect with patients for Virtual Visits (Telemedicine).  Patients are able to view lab/test results, encounter notes, upcoming appointments, etc.  Non-urgent messages can be sent to your provider as well.   To learn more about what you can do with MyChart, go to [email or web address].   Other Instructions       1st Floor: - Lobby - Registration  - Pharmacy  - Lab - Cafe  2nd Floor: - PV Lab - Diagnostic Testing (echo, CT, nuclear med)  3rd Floor: - Vacant  4th Floor: - TCTS (cardiothoracic surgery) - AFib Clinic - Structural Heart Clinic - Vascular Surgery  - Vascular Ultrasound  5th Floor: - HeartCare Cardiology (general and EP) - Clinical Pharmacy for coumadin, hypertension, lipid, weight-loss medications, and med management appointments    Valet parking services will be available as well.

## 2024-01-24 NOTE — Progress Notes (Signed)
 Cardiology Office Note:  .    Date:  01/24/2024  ID:  Blanche Bunker, DOB [birth year], MRN [id number] PCP: Tena Feeling, MD  [facility] Providers Cardiologist:  Jann Melody, MD     CC: Follow up post CABG  History of Present Illness: .    Nakema  N Sakamoto is an 83 year old female with coronary artery disease who presents for follow-up after bypass surgery.  She was previously seen for chest pain and was found to have severe obstructive coronary disease. A CT scan revealed multivessel disease, leading to expedited care and subsequent bypass surgery. No further episodes of atrial fibrillation have occurred since the surgery.  She experiences occasional sternal pain and has concerns about anxiety, particularly related to her heart health post-surgery. She describes episodes of anxiety and worries about her heart, which she attributes to the surgery. She also mentions feeling overwhelmed by family expectations and responsibilities.  She has not needed to use nitroglycerin  since her last visit and is planning to resume inline dancing, which she enjoyed prior to her surgery. She is actively trying to improve her diet by eating more salads, salmon, and shrimp, and is mindful of her diabetes management by reducing sweets and opting for whole wheat bread.  She feels depressed at times, particularly missing her husband, who passed away after 61 years of marriage. She finds solace in talking to others who have experienced similar losses.  She is currently on metoprolol  25 mg twice a day, which is being reduced to 12.5 mg twice a day. She also takes potassium supplements and is on half a dose of Lasix . She wants to reduce her medication burden if possible.  Relevant histories: .  ROS: As per HPI.   Studies Reviewed: .   Cardiac Studies & Procedures   ______________________________________________________________________________________________ CARDIAC  CATHETERIZATION  CARDIAC CATHETERIZATION 07/07/2023  Conclusion   Ost LAD lesion is 85% stenosed.   Prox LAD lesion is 70% stenosed.   1st Diag lesion is 60% stenosed.   Mid LAD lesion is 50% stenosed.   1st Mrg lesion is 70% stenosed.   RPAV-1 lesion is 95% stenosed.   RPAV-2 lesion is 80% stenosed.   RPDA lesion is 70% stenosed.   The left ventricular systolic function is normal.   LV end diastolic pressure is normal.   The left ventricular ejection fraction is 55-65% by visual estimate.  Severe 3 vessel obstructive CAD Normal LV function Normal LVEDP  Plan: her disease is not amenable to PCI. Would consider CABG given high risk anatomy.  Findings Coronary Findings Diagnostic  Dominance: Right  Left Main Vessel was injected. Vessel is normal in caliber. The vessel exhibits minimal luminal irregularities. The vessel is severely calcified.  Left Anterior Descending Ost LAD lesion is 85% stenosed. The lesion is calcified. Prox LAD lesion is 70% stenosed. The lesion is segmental. The lesion is severely calcified. Mid LAD lesion is 50% stenosed.  First Diagonal Branch 1st Diag lesion is 60% stenosed.  Left Circumflex  First Obtuse Marginal Branch 1st Mrg lesion is 70% stenosed.  Right Coronary Artery  Right Posterior Descending Artery RPDA lesion is 70% stenosed.  Right Posterior Atrioventricular Artery RPAV-1 lesion is 95% stenosed. RPAV-2 lesion is 80% stenosed.  Intervention  No interventions have been documented.   STRESS TESTS  MYOCARDIAL PERFUSION IMAGING 04/12/2015  Narrative  Nuclear stress EF: 70%.  The study is normal.  This is a low risk study.  The left ventricular ejection fraction  is hyperdynamic (>65%).   ECHOCARDIOGRAM  ECHOCARDIOGRAM COMPLETE 07/22/2023  Narrative ECHOCARDIOGRAM REPORT    Patient Name:   Marzetta  Ceola Collie Date of Exam: 07/22/2023 Medical Rec #:  [id number]       Height:       60.0 in Accession #:    [id number]       Weight:       133.0 lb Date of Birth:  Jan 28, 1941       BSA:          1.569 m Patient Age:    81 years        BP:           116/68 mmHg Patient Gender: F               HR:           65 bpm. Exam Location:  [address]  Procedure: 2D Echo, Cardiac Doppler and Color Doppler  Indications:    Z01.810 Preoperative cardiovascular examination  History:        Patient has no prior history of Echocardiogram examinations. Risk Factors:Diabetes, Dyslipidemia and Hypertension.  Sonographer:    Brigid Canada RDCS Referring Phys: [id number] [facility] A Rosary Filosa  IMPRESSIONS   1. Left ventricular ejection fraction, by estimation, is 55 to 60%. The left ventricle has normal function. The left ventricle has no regional wall motion abnormalities. Left ventricular diastolic parameters are consistent with Grade I diastolic dysfunction (impaired relaxation). 2. Right ventricular systolic function is normal. The right ventricular size is normal. Tricuspid regurgitation signal is inadequate for assessing PA pressure. 3. The mitral valve is normal in structure. Trivial mitral valve regurgitation. No evidence of mitral stenosis. 4. The aortic valve is tricuspid. There is moderate calcification of the aortic valve. Aortic valve regurgitation is trivial. Aortic valve sclerosis/calcification is present, without any evidence of aortic stenosis. Aortic valve mean gradient measures 9.0 mmHg. 5. Aortic dilatation noted. There is mild dilatation of the ascending aorta, measuring 39 mm. 6. The inferior vena cava is normal in size with greater than 50% respiratory variability, suggesting right atrial pressure of 3 mmHg.  FINDINGS Left Ventricle: Left ventricular ejection fraction, by estimation, is 55 to 60%. The left ventricle has normal function. The left ventricle has no regional wall motion abnormalities. The left ventricular internal cavity size was normal in size. There is no left ventricular  hypertrophy. Left ventricular diastolic parameters are consistent with Grade I diastolic dysfunction (impaired relaxation).  Right Ventricle: The right ventricular size is normal. No increase in right ventricular wall thickness. Right ventricular systolic function is normal. Tricuspid regurgitation signal is inadequate for assessing PA pressure.  Left Atrium: Left atrial size was normal in size.  Right Atrium: Right atrial size was normal in size.  Pericardium: There is no evidence of pericardial effusion.  Mitral Valve: The mitral valve is normal in structure. There is mild calcification of the mitral valve leaflet(s). Mild mitral annular calcification. Trivial mitral valve regurgitation. No evidence of mitral valve stenosis.  Tricuspid Valve: The tricuspid valve is normal in structure. Tricuspid valve regurgitation is trivial.  The aortic valve is tricuspid. There is moderate calcification of the aortic valve. Aortic valve regurgitation is trivial. Aortic valve sclerosis/calcification is present, without any evidence of aortic stenosis. Pulmonic Valve: The pulmonic valve was normal in structure. Pulmonic valve regurgitation is trivial.  Aorta: The aortic root is normal in size and structure and aortic dilatation noted. There is mild dilatation of the ascending aorta, measuring 39  mm.  Venous: The inferior vena cava is normal in size with greater than 50% respiratory variability, suggesting right atrial pressure of 3 mmHg.  IAS/Shunts: No atrial level shunt detected by color flow Doppler.   LEFT VENTRICLE PLAX 2D LVIDd:         4.00 cm   Diastology LVIDs:         2.50 cm   LV e' medial:    5.66 cm/s LV PW:         0.80 cm   LV E/e' medial:  16.5 LV IVS:        0.80 cm   LV e' lateral:   9.14 cm/s LVOT diam:     1.70 cm   LV E/e' lateral: 10.2 LV SV:         52 LV SV Index:   33 LVOT Area:     2.27 cm   RIGHT VENTRICLE             IVC RV Basal diam:  3.10 cm     IVC diam: 0.80  cm RV S prime:     10.45 cm/s TAPSE (M-mode): 2.2 cm  LEFT ATRIUM             Index        RIGHT ATRIUM           Index LA diam:        4.40 cm 2.80 cm/m   RA Area:     10.90 cm LA Vol (A2C):   40.8 ml 26.00 ml/m  RA Volume:   24.60 ml  15.67 ml/m LA Vol (A4C):   42.1 ml 26.82 ml/m LA Biplane Vol: 42.2 ml 26.89 ml/m AORTIC VALVE AV Area (Vmax):    1.28 cm AV Area (Vmean):   1.19 cm AV Area (VTI):     1.22 cm AV Vmax:           178.00 cm/s AV Vmean:          128.450 cm/s AV VTI:            0.429 m AV Peak Grad:      12.7 mmHg AV Mean Grad:      9.0 mmHg LVOT Vmax:         100.63 cm/s LVOT Vmean:        67.367 cm/s LVOT VTI:          0.231 m LVOT/AV VTI ratio: 0.54  AORTA Ao Root diam: 2.90 cm Ao Asc diam:  3.90 cm  MITRAL VALVE MV Area (PHT): 2.89 cm     SHUNTS MV Decel Time: 263 msec     Systemic VTI:  0.23 m MV E velocity: 93.55 cm/s   Systemic Diam: 1.70 cm MV A velocity: 107.50 cm/s MV E/A ratio:  0.87  Dalton McleanMD Electronically signed by Archer Bear Signature Date/Time: 07/22/2023/4:17:11 PM    Final   TEE  ECHO INTRAOPERATIVE TEE 07/29/2023  Narrative *INTRAOPERATIVE TRANSESOPHAGEAL REPORT *    Patient Name:   Fredia  Ceola Collie Date of Exam: 07/29/2023 Medical Rec #:  [id number]       Height:       60.0 in Accession #:    [id number]      Weight:       133.0 lb Date of Birth:  1941/09/07       BSA:          1.57 m Patient Age:    72 years  BP:           152/71 mmHg Patient Gender: F               HR:           54 bpm. Exam Location:  Inpatient  Transesophogeal exam was perform intraoperatively during surgical procedure. Patient was closely monitored under general anesthesia during the entirety of examination.  Indications:     coronary artery bypass surgery Performing Phys: [id number] Marinell Siad LIGHTFOOT Diagnosing Phys: Arvie Latus MD  Complications: No known complications during this procedure. POST-OP IMPRESSIONS _  Left Ventricle: has normal systolic function, with an ejection fraction of 65%. The cavity size was normal. The wall motion is normal. _ Right Ventricle: normal function. The cavity was normal. The wall motion is normal. _ Aorta: there is no dissection present in the aorta. _ Aortic Valve: The aortic valve appears unchanged from pre-bypass. _ Mitral Valve: There is mild regurgitation. _ Tricuspid Valve: There is mild regurgitation.  PRE-OP FINDINGS Left Ventricle: The left ventricle has hyperdynamic systolic function, with an ejection fraction of >65%. The cavity size was normal. No evidence of left ventricular regional wall motion abnormalities. There is mild concentric left ventricular hypertrophy.   Right Ventricle: The right ventricle has normal systolic function. The cavity was normal. There is no increase in right ventricular wall thickness.  Left Atrium: Left atrial size was not assessed. No left atrial/left atrial appendage thrombus was detected. The left atrial appendage is well visualized and there is no evidence of thrombus present. Left atrial appendage velocity is normal at greater than 40 cm/s.  Right Atrium: Right atrial size was not assessed.  Interatrial Septum: No atrial level shunt detected by color flow Doppler. There is no evidence of a patent foramen ovale.  Pericardium: There is no evidence of pericardial effusion.  Mitral Valve: The mitral valve is normal in structure. There is mild mitral annular calcification present. Mitral valve regurgitation is not visualized by color flow Doppler. There is No evidence of mitral stenosis. There is mild thickening present on the mitral valve anterior cusp with normal mobility and there is mild thickening present on the mitral valve posterior cusp with normal mobility.  Tricuspid Valve: The tricuspid valve was normal in structure. Tricuspid valve regurgitation is mild by color flow Doppler. The jet is directed centrally. No  evidence of tricuspid stenosis is present.  Aortic Valve: The aortic valve is tricuspid Aortic valve regurgitation is mild by color flow Doppler. The jet is centrally-directed. 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 There is no evidence of aortic valve vegetation. There is mild thickening and mild calcification present on the aortic valve right coronary, left coronary and non-coronary cusps with mildly decreased mobility.  Pulmonic Valve: The pulmonic valve was normal in structure, with normal. No evidence of pumonic stenosis. Pulmonic valve regurgitation is trivial by color flow Doppler.   Aorta: There is evidence of a dissection in the none.  Shunts: There is no evidence of an atrial septal defect.  +--------------+--------++ LEFT VENTRICLE         +--------------+--------++ PLAX 2D                +--------------+--------++ LVOT diam:    2.00 cm  +--------------+--------++ LVOT Area:    3.14 cm +--------------+--------++                        +--------------+--------++  +------------------+------------++  AORTIC VALVE                   +------------------+------------++ AV Area (Vmax):   1.13 cm     +------------------+------------++ AV Area (Vmean):  1.13 cm     +------------------+------------++ AV Area (VTI):    0.88 cm     +------------------+------------++ AV Vmax:          178.50 cm/s  +------------------+------------++ AV Vmean:         122.000 cm/s +------------------+------------++ AV VTI:           0.526 m      +------------------+------------++ AV Peak Grad:     12.7 mmHg    +------------------+------------++ AV Mean Grad:     6.5 mmHg     +------------------+------------++ LVOT Vmax:        64.30 cm/s   +------------------+------------++ LVOT Vmean:       43.900 cm/s  +------------------+------------++ LVOT VTI:         0.148 m       +------------------+------------++ LVOT/AV VTI ratio:0.28         +------------------+------------++  +--------------+-------++ AORTA                 +--------------+-------++ Ao Sinus diam:2.40 cm +--------------+-------++ Ao STJ diam:  2.2 cm  +--------------+-------++ Ao Asc diam:  3.20 cm +--------------+-------++  +--------------+----------++ MITRAL VALVE              +--------------+-------+ +--------------+----------++  SHUNTS                MV Area (PHT):6.32 cm    +--------------+-------+ +--------------+----------++  Systemic VTI: 0.15 m  MV Peak grad: 2.9 mmHg    +--------------+-------+ +--------------+----------++  Systemic Diam:2.00 cm MV Mean grad: 2.0 mmHg    +--------------+-------+ +--------------+----------++ MV Vmax:      0.85 m/s   +--------------+----------++ MV Vmean:     66.1 cm/s  +--------------+----------++ MV VTI:       0.22 m     +--------------+----------++ MV PHT:       34.80 msec +--------------+----------++ MV Decel Time:120 msec   +--------------+----------++ +--------------+----------++ MV E velocity:82.40 cm/s +--------------+----------++ MV A velocity:85.90 cm/s +--------------+----------++ MV E/A ratio: 0.96       +--------------+----------++   Arvie Latus MD Electronically signed by Arvie Latus MD Signature Date/Time: 08/01/2023/5:51:06 PM    Final    CT SCANS  CT CORONARY MORPH W/CTA COR W/SCORE 06/30/2023  Addendum 07/21/2023 12:15 PM ADDENDUM REPORT: 07/21/2023 12:13  EXAM: OVER-READ INTERPRETATION  CT CHEST  The following report is an over-read performed by radiologist Dr. Cyndia Drape [facility] on 07/21/2023. This over-read does not include interpretation of cardiac or coronary anatomy or pathology. The coronary CTA interpretation by the cardiologist is attached.  COMPARISON:   None.  FINDINGS: Cardiovascular:  See findings discussed in the body of the report.  Mediastinum/Nodes: No suspicious adenopathy identified. Imaged mediastinal structures are unremarkable.  Lungs/Pleura: There is dependent basilar subsegmental atelectasis. No pneumonia or pulmonary edema. No pleural effusion or pneumothorax.  Upper Abdomen: No acute abnormality.  Musculoskeletal: No chest wall abnormality. No acute osseous findings. There are thoracic degenerative changes.  IMPRESSION: No acute extracardiac incidental findings identified.   Electronically Signed By: Sydell Eva M.D. On: 07/21/2023 12:13  Narrative HISTORY: Chest pain, nonspecific  EXAM: Cardiac/Coronary  CT  TECHNIQUE: The patient was scanned on a [employer].  PROTOCOL: A 120 kV prospective scan was triggered in the descending thoracic aorta at 111 HU's. Axial non-contrast 3 mm  slices were carried out through the heart. The data set was analyzed on a dedicated work station and scored using the Agatston method. Gantry rotation speed was 250 msecs and collimation was .6 mm. Beta blockade and 0.8 mg of sl NTG was given. The 3D data set was reconstructed in 5% intervals of the 35-75 % of the R-R cycle. Systolic and diastolic phases were analyzed on a dedicated work station using MPR, MIP and VRT modes. The patient received contrast: 95mL OMNIPAQUE  IOHEXOL  350 MG/ML SOLN.  FINDINGS: Image quality: Poor  Noise artifact is: Reduced signal to noise and calcium  blooming.  Coronary calcium  score is 899, which places the patient in the 88th percentile for age and sex matched control.  Coronary arteries: Normal coronary origins.  Right dominance.  Right Coronary Artery: Moderate mixed plaque in the proximal RCA with positive remodeling, 50-69% stenosis. Concern for severe mixed plaque at the distal RCA at the bifurcation of PDA and PLA, with severe mixed plaque in the proximal PLA,  70-99% stenosis.  Left Main Coronary Artery: Mild mixed atherosclerotic plaque at the distal LM, 25-49% stenosis. This extends into a severe mixed plaque in the ostial LAD.  Left Anterior Descending Coronary Artery: Severe ostial LAD mixed plaque, 70-99% stenosis. Concern for left main equivalent disease. Severe mixed plaque in the proximal LAD, 70-99% stenosis. Scattered moderate plaque in the mid vessel, 50-69% stenosis. Possible severe stenosis in the ostial first diagonal, 70-99% stenosis.  Left Circumflex Artery: Severe mixed plaque in the proximal LCx.  Aorta: Normal size, 33 mm at the mid ascending aorta (level of the PA bifurcation) measured double oblique.  Aortic Valve: Trivial calcifications, mild leaflet thickening. AV calcium  score 72.  Other findings:  Normal pulmonary vein drainage into the left atrium.  Normal left atrial appendage without thrombus.  Normal size of the pulmonary artery.  Please see separate report from [facility] for non-cardiac findings.  IMPRESSION: 1. Severe ostial LAD stenosis, with concern for severe lesions in the proximal LCx, first diagonal artery, and distal RCA/proximal PLA. 70-99% stenosis, CADRADS 4. Consider cardiac catheterization with concern for left main equivalent disease.  2. CT FFR will be performed and reported separately.  3. Total plaque volume 635 mm3 which is 65th percentile for age- and sex-matched controls (calcified plaque 103 mm3; non-calcified plaque 532 mm3). 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  4. Coronary calcium  score is 899, which places the patient in the 88th percentile for age and sex matched control.  5. Normal coronary origins with right dominance.  RECOMMENDATIONS: CAD-RADS 4 Severe stenosis. (70-99% or > 50% left main). Cardiac catheterization or CT FFR is recommended. Consider symptom-guided anti-ischemic pharmacotherapy as well as risk factor modification per guideline directed  care.  Electronically Signed: By: Grady Lawman M.D. On: 07/01/2023 11:20     ______________________________________________________________________________________________      Physical Exam:    VS:  BP 118/60 (BP Location: Left Arm)   Pulse 62   Ht 5' (1.524 m)   Wt 64.4 kg   SpO2 97%   BMI 27.73 kg/m    Wt Readings from Last 3 Encounters:  01/24/24 64.4 kg  10/11/23 64 kg  09/23/23 63.3 kg    Gen: no distress  Neck: No JVD Cardiac: No Rubs or Gallops, no murmur, RRR +2 radial pulses Respiratory: Clear to auscultation bilaterally, normal effort, normal  respiratory rate GI: Soft, nontender, non-distended  MS: No  edema;  moves all extremities Integument: Skin feels warm Neuro:  At time of evaluation, alert  and oriented to person/place/time/situation; essential tremor Psych: Anxious affect, patient feels anxious  ASSESSMENT AND PLAN: .    An EKG was ordered for AF and shows SR FAV  Coronary artery disease Post-coronary artery bypass graft surgery with no recent chest pain or need for nitroglycerin . EKG shows sinus rhythm with first-degree heart block, no atrial fibrillation. Anxiety related to surgery is expected to improve. Aggressive cholesterol management is planned to prevent future blockages. Discussed potential switch to newer cholesterol medications if statins are not tolerated. - Order BMP and fasting lipids in 1-2 weeks - Consider PCSK9 inhibitor for secondary prevention based on lipid results  Coronary artery bypass graft surgery Recovery is progressing well. Pain is expected to improve within a year post-surgery. Encouraged resumption of activities like inline dancing to aid recovery and improve quality of life.  Paroxysmal atrial fibrillation No recent episodes. EKG shows sinus rhythm with first-degree heart block. Plan to monitor and adjust medications based on symptoms and lab results.  Hypokalemia Currently on potassium supplementation. Plan to  reassess need for supplementation based on upcoming lab results. Discussed potential discontinuation of potassium if labs are stable. - Order BMP in 1-2 weeks - Consider stopping potassium supplementation based on lab results  Essential tremor Managed with metoprolol . Plan to reduce metoprolol  dose and potentially switch back to propranolol  if labs are stable and symptoms allow. Discussed potential increase in heart rate and anxiety with dose reduction. - Consider switching back to propranolol  based on lab results and symptom control  Six months with Slater Duncan NP  Gloriann Larger, MD FASE [facility] Cardiologist [facility]  [address] [place], [address] [place], [place] [address] [phone]  4:23 PM

## 2024-01-25 ENCOUNTER — Other Ambulatory Visit: Payer: Self-pay

## 2024-01-25 DIAGNOSIS — E876 Hypokalemia: Secondary | ICD-10-CM | POA: Diagnosis not present

## 2024-01-25 DIAGNOSIS — I2510 Atherosclerotic heart disease of native coronary artery without angina pectoris: Secondary | ICD-10-CM | POA: Diagnosis not present

## 2024-01-25 DIAGNOSIS — E785 Hyperlipidemia, unspecified: Secondary | ICD-10-CM | POA: Diagnosis not present

## 2024-01-25 LAB — LIPID PANEL
Chol/HDL Ratio: 3.3 ratio (ref 0.0–4.4)
Cholesterol, Total: 215 mg/dL — ABNORMAL HIGH (ref 100–199)
HDL: 65 mg/dL (ref >39)
LDL Chol Calc (NIH): 130 mg/dL — ABNORMAL HIGH (ref 0–99)
Triglycerides: 113 mg/dL (ref 0–149)
VLDL Cholesterol Cal: 20 mg/dL (ref 5–40)

## 2024-01-25 LAB — BASIC METABOLIC PANEL WITH GFR
BUN/Creatinine Ratio: 32 — ABNORMAL HIGH (ref 12–28)
BUN: 38 mg/dL — ABNORMAL HIGH (ref 8–27)
CO2: 23 mmol/L (ref 20–29)
Calcium: 9.7 mg/dL (ref 8.7–10.3)
Chloride: 104 mmol/L (ref 96–106)
Creatinine, Ser: 1.18 mg/dL — ABNORMAL HIGH (ref 0.57–1.00)
Glucose: 147 mg/dL — ABNORMAL HIGH (ref 70–99)
Potassium: 4.6 mmol/L (ref 3.5–5.2)
Sodium: 142 mmol/L (ref 134–144)
eGFR: 46 mL/min/{1.73_m2} — ABNORMAL LOW (ref >59)

## 2024-01-25 MED ORDER — SPIRONOLACTONE 25 MG PO TABS: 12.5000 mg | ORAL_TABLET | Freq: Every day | ORAL | 3 refills | Status: AC

## 2024-01-26 ENCOUNTER — Encounter: Payer: Self-pay | Admitting: Internal Medicine

## 2024-02-01 NOTE — Telephone Encounter (Signed)
 Left a message to call back.

## 2024-02-24 ENCOUNTER — Ambulatory Visit: Payer: Self-pay

## 2024-02-24 DIAGNOSIS — E785 Hyperlipidemia, unspecified: Secondary | ICD-10-CM

## 2024-03-06 DIAGNOSIS — H401111 Primary open-angle glaucoma, right eye, mild stage: Secondary | ICD-10-CM | POA: Diagnosis not present

## 2024-03-06 DIAGNOSIS — H401123 Primary open-angle glaucoma, left eye, severe stage: Secondary | ICD-10-CM | POA: Diagnosis not present

## 2024-04-18 ENCOUNTER — Encounter: Payer: Self-pay | Admitting: Pharmacist Clinician (PhC)/ Clinical Pharmacy Specialist

## 2024-04-18 ENCOUNTER — Ambulatory Visit: Attending: Cardiology | Admitting: Pharmacist Clinician (PhC)/ Clinical Pharmacy Specialist

## 2024-04-18 DIAGNOSIS — E782 Mixed hyperlipidemia: Secondary | ICD-10-CM

## 2024-04-18 NOTE — Progress Notes (Unsigned)
 Office Visit    Patient Name: Donna  LOISE Guerrero Date of Encounter: 04/21/2024  Primary Care Provider:  Dwight Trula SQUIBB, MD Primary Cardiologist:  Stanly DELENA Leavens, MD  Chief Complaint    Hyperlipidemia   Significant Past Medical History   CAD 07/2023 CAC =899, cath showed severe 3 vessel disease -->  CABG x 3,  DM2 10/24 A1c 6.9; planning to start Mounjaro after recovers from CABG  HTN Controlled with HF meds, amlodipine   CHF Post CABG d/c had acute diastolic CHF, given daily furosemide , spironolactone   hypothyroid TSH WNL on levothyroxine  88 mcg     Allergies  Allergen Reactions   Welchol  [Colesevelam  Hcl] Other (See Comments)    Tablets were too big, CAUSED FLU-LIKE SYMPTOMS   Amaryl [Glimepiride] Other (See Comments)    headache   Atorvastatin     MYALGIAS   Codeine Nausea Only   Crestor [Rosuvastatin]     Myalgias   Empagliflozin Other (See Comments)    **Jardiance** leg pain   Ezetimibe Nausea Only    indigestion   Metformin Hcl Other (See Comments)    Unknown reaction   Simvastatin     Myalgia     History of Present Illness    Donna  TASHAWNA Guerrero is a 83 y.o. female patient of Dr Leavens in the office today to discuss options for cholesterol management.  Insurance Carrier: [facility] [id number]   Deductible [id number], then $47/month  Ph[facility]:  [facility]   LDL Cholesterol goal:  LDL < 55  Current Medications:  pitavastatin 1 mg three days per week  Previously tried:  atorvastatin, rosuvastatin, simvastatin - myalgias   Ezetimibe - indigestion   Social Hx: Tobacco: no Alcohol: occasional wine     Diet:    oatmeal with fruit for breakfast, yogurt w/fruit or whole grain bread with peanut butter;  dinner varies, but does like salads with hard boiled eggs; no pork  Exercise: joined [employer], was going to do [employer], but currently has knee issues, has been icing   Accessory Clinical Findings   Lab Results  Component Value Date   CHOL  215 (H) 01/25/2024   HDL 65 01/25/2024   LDLCALC 130 (H) 01/25/2024   TRIG 113 01/25/2024   CHOLHDL 3.3 01/25/2024    No results found for: LIPOA  Lab Results  Component Value Date   ALT 11 08/24/2023   AST 14 08/24/2023   ALKPHOS 99 08/24/2023   BILITOT 0.4 08/24/2023   Lab Results  Component Value Date   CREATININE 1.18 (H) 01/25/2024   BUN 38 (H) 01/25/2024   NA 142 01/25/2024   K 4.6 01/25/2024   CL 104 01/25/2024   CO2 23 01/25/2024   Lab Results  Component Value Date   HGBA1C 6.9 (H) 07/28/2023    Home Medications    Current Outpatient Medications  Medication Sig Dispense Refill   acetaminophen  (TYLENOL ) 500 MG tablet Take 500 mg by mouth every 6 (six) hours as needed for moderate pain.     amLODipine  (NORVASC ) 5 MG tablet Take 1 tablet (5 mg total) by mouth daily. 90 tablet 3   aspirin  EC 325 MG tablet Take 1 tablet (325 mg total) by mouth daily.     dorzolamide -timolol  (COSOPT ) 2-0.5 % ophthalmic solution Place 1 drop into both eyes 2 (two) times daily.     furosemide  (LASIX ) 20 MG tablet Take 20 mg by mouth.     glucose blood test strip 1 each by Other route  as needed for other. Use as instructed     levothyroxine  (SYNTHROID ) 100 MCG tablet Take 100 mcg by mouth daily before breakfast.     LIVALO 1 MG TABS Take 1 mg by mouth every Monday, Wednesday, and Friday.     nitroGLYCERIN  (NITROSTAT ) 0.4 MG SL tablet Place 1 tablet (0.4 mg total) under the tongue every 5 (five) minutes x 3 doses as needed for chest pain. 25 tablet 2   propranolol  (INDERAL ) 40 MG tablet Take 40 mg by mouth at bedtime.     spironolactone  (ALDACTONE ) 25 MG tablet Take 0.5 tablets (12.5 mg total) by mouth daily. 45 tablet 3   VYZULTA  0.024 % SOLN Place 1 drop into both eyes at bedtime.     No current facility-administered medications for this visit.     Assessment & Plan    Mixed hyperlipidemia Assessment: Patient with ASCVD not at LDL goal of < 55 Most recent LDL 130 on 01/25/24 Not  able to tolerate statins secondary to myalgias; ezetimibe due to indigestion Reviewed options for lowering LDL cholesterol, including PCSK-9 inhibitors, bempedoic acid and inclisiran.  Discussed mechanisms of action, dosing, side effects, potential decreases in LDL cholesterol and costs.  Also reviewed potential options for patient assistance.  Plan: Patient would like to think about her options and discuss with PCP  She will reach out to me once she has made a decision.    Bronte Sabado, PharmD CPP [facility] [address]   [place], [place] [address] [phone]  04/21/2024, 8:42 AM

## 2024-04-18 NOTE — Patient Instructions (Addendum)
 Your Results:             Your most recent labs Goal  Total Cholesterol 215 < 200  Triglycerides 113 < 150  HDL (happy/good cholesterol) 65 > 40  LDL (lousy/bad cholesterol 130 < 55      Repatha (evolocumab)  Leqvio (inclisrin)   How is it given? Single use injectable pen you would use at home  An injection done by a healthcare professional in-clinic  How often do you take it? Once every 2 weeks 1st and 2nd dose 3 months apart, then every 6 months   White kind of side effects may you experience? -Flu-like symptoms for 36-48 hours following a dose, typically only for the first few doses -Injection site reactions -Bronchitis-like symptoms for 36-48 hours following a dose, typically only for the first few doses -Injection site reactions  How much will this lower your LDL? Are there other benefits? LDL reduction of up to 50-70% Also, this medication has shown that it can help to lower plaque burden when taken with a statin LDL reduction of up to 45-60%   How much will this cost? On your plan $47/month after $250 deductible (if not already met) Depends on insurance plan - is covered by medical plan, not prescription drug plan.         Thank you for choosing [facility]

## 2024-04-19 DIAGNOSIS — E1122 Type 2 diabetes mellitus with diabetic chronic kidney disease: Secondary | ICD-10-CM | POA: Diagnosis not present

## 2024-04-19 DIAGNOSIS — I2510 Atherosclerotic heart disease of native coronary artery without angina pectoris: Secondary | ICD-10-CM | POA: Diagnosis not present

## 2024-04-19 DIAGNOSIS — E039 Hypothyroidism, unspecified: Secondary | ICD-10-CM | POA: Diagnosis not present

## 2024-04-19 DIAGNOSIS — E782 Mixed hyperlipidemia: Secondary | ICD-10-CM | POA: Diagnosis not present

## 2024-04-21 NOTE — Assessment & Plan Note (Signed)
 Assessment: Patient with ASCVD not at LDL goal of < 55 Most recent LDL 130 on 01/25/24 Not able to tolerate statins secondary to myalgias; ezetimibe due to indigestion Reviewed options for lowering LDL cholesterol, including PCSK-9 inhibitors, bempedoic acid and inclisiran.  Discussed mechanisms of action, dosing, side effects, potential decreases in LDL cholesterol and costs.  Also reviewed potential options for patient assistance.  Plan: Patient would like to think about her options and discuss with PCP  She will reach out to me once she has made a decision.

## 2024-04-26 DIAGNOSIS — M25561 Pain in right knee: Secondary | ICD-10-CM | POA: Diagnosis not present

## 2024-05-11 ENCOUNTER — Encounter: Payer: Self-pay | Admitting: Internal Medicine

## 2024-05-17 ENCOUNTER — Encounter: Payer: Self-pay | Admitting: Pharmacist

## 2024-05-17 DIAGNOSIS — G250 Essential tremor: Secondary | ICD-10-CM | POA: Insufficient documentation

## 2024-05-17 DIAGNOSIS — E1122 Type 2 diabetes mellitus with diabetic chronic kidney disease: Secondary | ICD-10-CM | POA: Diagnosis not present

## 2024-05-17 DIAGNOSIS — E782 Mixed hyperlipidemia: Secondary | ICD-10-CM | POA: Diagnosis not present

## 2024-05-17 DIAGNOSIS — H401111 Primary open-angle glaucoma, right eye, mild stage: Secondary | ICD-10-CM | POA: Insufficient documentation

## 2024-05-17 DIAGNOSIS — E039 Hypothyroidism, unspecified: Secondary | ICD-10-CM | POA: Diagnosis not present

## 2024-05-17 DIAGNOSIS — E1165 Type 2 diabetes mellitus with hyperglycemia: Secondary | ICD-10-CM | POA: Insufficient documentation

## 2024-05-17 DIAGNOSIS — E1121 Type 2 diabetes mellitus with diabetic nephropathy: Secondary | ICD-10-CM | POA: Insufficient documentation

## 2024-05-23 DIAGNOSIS — I2510 Atherosclerotic heart disease of native coronary artery without angina pectoris: Secondary | ICD-10-CM | POA: Diagnosis not present

## 2024-05-23 DIAGNOSIS — E782 Mixed hyperlipidemia: Secondary | ICD-10-CM | POA: Diagnosis not present

## 2024-05-23 DIAGNOSIS — E039 Hypothyroidism, unspecified: Secondary | ICD-10-CM | POA: Diagnosis not present

## 2024-05-23 DIAGNOSIS — E1122 Type 2 diabetes mellitus with diabetic chronic kidney disease: Secondary | ICD-10-CM | POA: Diagnosis not present

## 2024-06-07 ENCOUNTER — Other Ambulatory Visit: Payer: Self-pay | Admitting: Student

## 2024-06-07 NOTE — Progress Notes (Signed)
 " Cardiology Office Note:  .   Date:  06/09/2024  ID:  Donna Guerrero, DOB 1941/07/07, MRN [id number] PCP: Dwight Trula SQUIBB, MD  [facility] Providers Cardiologist:  Stanly DELENA Leavens, MD    Patient Profile: .      PMH Coronary artery disease S/p CABG (LIMA-LAD, SVG-OM, SVG-PDA) on 07/29/2023 Atrial fibrillation post CABG Hypertension HFpEF Hyperlipidemia Type 2 DM Hypothyroidism Essential tremor Statin intolerance  Previously seen by Dr. Dann with history of nuclear stress test 2016 which was normal.  BP was high during the test.  She returned for evaluation of chest tightness on 06/21/2023.  Previous notes reveal history of hypertension, hyperlipidemia, and diabetes.  She had difficulty tolerating medications.  Had been on simvastatin and atorvastatin but had side effects from both.  Crestor was started but she also stopped it.  She also had difficulty with medications complicating management of her diabetes.  At office visit 06/21/2023, she reported dyspnea on exertion and chest tightness.  She noted this when walking up steep hills, however symptoms persisted.  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  She was advised to undergo coronary CTA which revealed severe LAD stenosis and multivessel disease.  She was seen in follow-up by Dr. Arnetha on 07/05/2023 due to Dr. Johnice departure from the practice.  She was advised to undergo cardiac catheterization.  LHC on 07/07/2023 revealed severe three-vessel obstructive coronary artery disease with normal LV function and normal LVEDP.  Her disease was not felt amendable to PCI and she was referred for CABG.  She underwent CABG x 3 with Dr. Shyrl on 07/29/2023.  She developed postoperative atrial fibrillation and was placed on amiodarone .  She was not placed on anticoagulation.  She was discharged post CABG on 08/03/2023.  Return admission 11/7-11/8/24 for LE edema and shortness of breath.  She reported  she was initially doing very well after discharge but started to notice some swelling in her feet around 08/08/2023.  Saw PCP on 11/4 who advised her to continue Lasix .  She called PCP 11/6 who recommended increasing Lasix  to twice daily.  She also reported abdominal distention and a 10 pound weight gain since hospital discharge.  When she awoke on the morning of admission, she felt like she could not take a deep breath.  Upon arrival to ED she was markedly hypertensive at 189/117.  EKG showed sinus bradycardia at 58 bpm with first-degree AV block and nonspecific ST/T wave changes.  Initial troponin minimally elevated at 19.  BNP elevated at four 1.3.  CXR showed similar left base collapse/consolidation with small effusion.  Potassium was 3.1.  She was admitted for acute diastolic CHF.  She was diuresed with IV Lasix  with significant improvement in symptoms.  She was discharged home on Lasix  40 mg daily and spironolactone  12.5 mg daily.  Seen in follow-up in clinic by Artist Pouch, PA, on 08/24/2023. Feeling much better with no recurrence of swelling or shortness of breath. Was having chest discomfort like a sunburn and had been managing with half a dose of oxycodone  taken in the morning and evening.  Fall that occurred 2 weeks prior to CABG which resulted in a hit to the back of her head, missed a step. Wanted to reduce number of medications she was taking. She was encouraged to stop the oxycodone  and use Tylenol  for pain relief.  She was advised to discontinue amiodarone  in 1 month if no recurrence of A-fib.  CMP at that  time revealed K 5.1 and creatinine 1.36. She was advised to reduce Lasix  to 20 mg daily and discontinue her potassium supplement.  Repeat BMP 09/01/23 revealed stable creatinine at 1.36 and potassium 4.3.  Seen by me on 09/23/23 for follow-up of chronic HFpEF.  Having post-surgical discomfort which radiates across her chest which she manages with Tylenol  every six hours. No exertional chest  pain. Getting stronger. No shortness of breath. Feels that her tremors have worsened slightly since switching from propranolol  to metoprolol , most notably when writing. She asks about refilling amiodarone . She was advised to stop once she completed her current prescription. Her daughter has been giving her Lasix  40 mg daily rather than 20 mg, not monitoring daily weight. Appetite has improved over the past month.  Seen by Dr. Arnetha 01/24/2024, overall feeling well.  She was working on improving her diet and also reported occasional depression, missing her husband to whom she was married 61 years.  Was advised to see Pharm.D. for initiation of PCSK9 inhibitor, or bempedoic acid.  She wanted to consider her options and discuss with endocrinologist.          History of Present Illness: .     Discussed the use of AI scribe software for clinical note transcription with the patient, who gave verbal consent to proceed.  History of Present Illness Donna  N Guerrero is a very pleasant 83 year old female who is here today for follow-up of coronary artery disease. Reports she is feeling well and is planning to travel to [place] with her son and daughter soon. She is on Livalo for management of hyperlipidemia and her endocrinologist recently increased her dose to 2 mg daily to improve LDL levels.  She experiences swelling in her right knee without pain, managed with ice and rest.  She was formerly a ballerina and was surprised that imaging of her knees reveals no abnormalities. States she finally feels back to normal since having CABG 1 year ago, and is gradually increasing her activity with walking.  She notes occasional discomfort in the upper left chest which she attributes to postsurgical changes.  She denies chest pain, shortness of breath, palpitations, orthopnea, PND, edema, presyncope, syncope.   Discussed the use of AI scribe software for clinical note transcription with the patient, who gave verbal  consent to proceed.   ROS: See HPI       Studies Reviewed: .        Risk Assessment/Calculations:             Physical Exam:   VS:  BP 112/68 (BP Location: Left Arm, Patient Position: Sitting, Cuff Size: Normal)   Pulse (!) 58   Ht 5' (1.524 m)   Wt 144 lb (65.3 kg)   SpO2 98%   BMI 28.12 kg/m    Wt Readings from Last 3 Encounters:  06/08/24 144 lb (65.3 kg)  01/24/24 142 lb (64.4 kg)  10/11/23 141 lb (64 kg)    GEN: Well nourished, well developed in no acute distress NECK: No JVD; No carotid bruits CARDIAC: RRR, no murmurs, rubs, gallops RESPIRATORY:  Clear to auscultation without rales, wheezing or rhonchi  ABDOMEN: Soft, non-tender, non-distended EXTREMITIES:  No edema; No deformity     ASSESSMENT AND PLAN: .    CAD s/p CABG: S/p CABG x 3 on 07/29/23. Occasional discomfort in upper left chest which she attributes to post surgical changes. No anginal symptoms or worsening discomfort with exertion. Continues to gradually increase activity and feels an  improvement in her strength and stamina.   LDL is above goal.  Her endocrinologist recently increased Livalo. No return of symptoms concerning for atrial fibrillation.  No bleeding concerns. Continue to focus on heart healthy mostly whole food diet avoiding saturated fat, processed foods, simple carbohydrates, and sugar along with aiming for at least 150 minutes of moderate intensity exercise each week. Continue aspirin , amlodipine , Livalo, propranolol , spironolactone .   HFpEF:  Admission for acute HFpEF 08/12/23 following CABG 07/29/23. Today, she denies shortness of breath, edema, orthopnea, or PND. Appears euvolemic on exam today.  Weight is stable. Continue Lasix , spironolactone , propranolol . Renal function stable on labs with endocrinologist recently.   Hypertension: BP is well-controlled.  Renal function is stable.  No changes to antihypertensive therapy today.  Continue spironolactone , propranolol ,  amlodipine .  Hyperlipidemia LDL goal < 55/Statin intolerance: Lipid panel completed LDL was  58 right after CABG 07/2023. Most recent lipid panel with total cholesterol 165, triglycerides 102, HDL 60, and LDL 87. We discussed goal LDL 55 or lower given history of CABG.  She has been intolerant to other statins.  Her endocrinologist recently increased Livalo dose to 2 g daily.  She is scheduled for repeat lipid panel with him.  I emphasized the importance of considering novel lipid-lowering agents to achieve goal of LDL 55 or lower.  Continue heart healthy diet like the Mediterranean diet, avoiding processed foods, saturated fat, sugar, and other simple carbohydrates and regular physical activity.  Type 2 DM: A1c has increased to 8.4% on 05/17/2024 from 6.9% on 07/28/2023.  She has been intolerant to multiple medications and is currently managing diabetes with lifestyle. She has soon follow-up with endocrinologist.        Dispo: 6 months with Dr. Santo  Signed, Rosaline Bane, NP-C "

## 2024-06-08 ENCOUNTER — Ambulatory Visit (HOSPITAL_BASED_OUTPATIENT_CLINIC_OR_DEPARTMENT_OTHER): Admitting: Nurse Practitioner

## 2024-06-08 ENCOUNTER — Encounter (HOSPITAL_BASED_OUTPATIENT_CLINIC_OR_DEPARTMENT_OTHER): Payer: Self-pay | Admitting: Nurse Practitioner

## 2024-06-08 VITALS — BP 112/68 | HR 58 | Ht 60.0 in | Wt 144.0 lb

## 2024-06-08 DIAGNOSIS — I2510 Atherosclerotic heart disease of native coronary artery without angina pectoris: Secondary | ICD-10-CM | POA: Diagnosis not present

## 2024-06-08 DIAGNOSIS — E118 Type 2 diabetes mellitus with unspecified complications: Secondary | ICD-10-CM

## 2024-06-08 DIAGNOSIS — E785 Hyperlipidemia, unspecified: Secondary | ICD-10-CM | POA: Diagnosis not present

## 2024-06-08 DIAGNOSIS — Z951 Presence of aortocoronary bypass graft: Secondary | ICD-10-CM

## 2024-06-08 DIAGNOSIS — I10 Essential (primary) hypertension: Secondary | ICD-10-CM

## 2024-06-08 DIAGNOSIS — I5032 Chronic diastolic (congestive) heart failure: Secondary | ICD-10-CM

## 2024-06-08 DIAGNOSIS — Z789 Other specified health status: Secondary | ICD-10-CM | POA: Diagnosis not present

## 2024-06-08 NOTE — Patient Instructions (Signed)
 Medication Instructions:   Your physician recommends that you continue on your current medications as directed. Please refer to the Current Medication list given to you today.   *If you need a refill on your cardiac medications before your next appointment, please call your pharmacy*  Lab Work:  None ordered.  If you have labs (blood work) drawn today and your tests are completely normal, you will receive your results only by: MyChart Message (if you have MyChart) OR A paper copy in the mail If you have any lab test that is abnormal or we need to change your treatment, we will call you to review the results.  Testing/Procedures:  None ordered.  Follow-Up: At [facility], you and your health needs are our priority.  As part of our continuing mission to provide you with exceptional heart care, our providers are all part of one team.  This team includes your primary Cardiologist (physician) and Advanced Practice Providers or APPs (Physician Assistants and Nurse Practitioners) who all work together to provide you with the care you need, when you need it.  Your next appointment:   6 month(s)  Provider:   Stanly DELENA Leavens, MD    We recommend signing up for the patient portal called MyChart.  Sign up information is provided on this After Visit Summary.  MyChart is used to connect with patients for Virtual Visits (Telemedicine).  Patients are able to view lab/test results, encounter notes, upcoming appointments, etc.  Non-urgent messages can be sent to your provider as well.   To learn more about what you can do with MyChart, go to [email or web address].   Other Instructions  Your physician wants you to follow-up in: 6 months.  You will receive a reminder letter in the mail two months in advance. If you don't receive a letter, please call our office to schedule the follow-up appointment.  GOAL: LDL  is 55 or below.

## 2024-06-09 ENCOUNTER — Encounter (HOSPITAL_BASED_OUTPATIENT_CLINIC_OR_DEPARTMENT_OTHER): Payer: Self-pay

## 2024-06-09 MED ORDER — ASPIRIN 81 MG PO TBEC: 81.0000 mg | DELAYED_RELEASE_TABLET | Freq: Every day | ORAL | Status: AC

## 2024-06-22 DIAGNOSIS — H6123 Impacted cerumen, bilateral: Secondary | ICD-10-CM | POA: Diagnosis not present

## 2024-07-22 DIAGNOSIS — N390 Urinary tract infection, site not specified: Secondary | ICD-10-CM | POA: Diagnosis not present

## 2024-07-24 NOTE — Progress Notes (Signed)
 Donna  SHARENA Guerrero                                          MRN: [id number]   07/24/2024   The VBCI Quality Team Specialist reviewed this patient medical record for the purposes of chart review for care gap closure. The following were reviewed: chart review for care gap closure-kidney health evaluation for diabetes:uACR.    VBCI Quality Team

## 2024-07-31 DIAGNOSIS — H401111 Primary open-angle glaucoma, right eye, mild stage: Secondary | ICD-10-CM | POA: Diagnosis not present

## 2024-07-31 DIAGNOSIS — H401123 Primary open-angle glaucoma, left eye, severe stage: Secondary | ICD-10-CM | POA: Diagnosis not present

## 2024-09-18 ENCOUNTER — Emergency Department (HOSPITAL_COMMUNITY)

## 2024-09-18 ENCOUNTER — Emergency Department (HOSPITAL_COMMUNITY)
Admission: EM | Admit: 2024-09-18 | Discharge: 2024-09-19 | Discharge status: Against Medical Advice | Attending: Emergency Medicine | Admitting: Emergency Medicine

## 2024-09-18 DIAGNOSIS — R519 Headache, unspecified: Secondary | ICD-10-CM | POA: Diagnosis not present

## 2024-09-18 DIAGNOSIS — R079 Chest pain, unspecified: Secondary | ICD-10-CM | POA: Diagnosis present

## 2024-09-18 DIAGNOSIS — Z5321 Procedure and treatment not carried out due to patient leaving prior to being seen by health care provider: Secondary | ICD-10-CM | POA: Diagnosis not present

## 2024-09-18 LAB — BASIC METABOLIC PANEL WITH GFR
Anion gap: 9 (ref 5–15)
BUN: 29 mg/dL — ABNORMAL HIGH (ref 8–23)
CO2: 26 mmol/L (ref 22–32)
Calcium: 8.9 mg/dL (ref 8.9–10.3)
Chloride: 100 mmol/L (ref 98–111)
Creatinine, Ser: 1.3 mg/dL — ABNORMAL HIGH (ref 0.44–1.00)
GFR, Estimated: 41 mL/min — ABNORMAL LOW (ref >60)
Glucose, Bld: 264 mg/dL — ABNORMAL HIGH (ref 70–99)
Potassium: 3.9 mmol/L (ref 3.5–5.1)
Sodium: 135 mmol/L (ref 135–145)

## 2024-09-18 LAB — CBC
HCT: 39.9 % (ref 36.0–46.0)
Hemoglobin: 12.4 g/dL (ref 12.0–15.0)
MCH: 30.1 pg (ref 26.0–34.0)
MCHC: 31.1 g/dL (ref 30.0–36.0)
MCV: 96.8 fL (ref 80.0–100.0)
Platelets: 185 K/uL (ref 150–400)
RBC: 4.12 MIL/uL (ref 3.87–5.11)
RDW: 12.7 % (ref 11.5–15.5)
WBC: 7.6 K/uL (ref 4.0–10.5)
nRBC: 0 % (ref 0.0–0.2)

## 2024-09-18 LAB — TROPONIN I (HIGH SENSITIVITY): Troponin I (High Sensitivity): 6 ng/L (ref <18)

## 2024-09-18 NOTE — ED Provider Triage Note (Signed)
 Emergency Medicine Provider Triage Evaluation Note  Lacie  ZOIEE WIMMER , a 83 y.o. female  was evaluated in triage.  Pt complains of her eating started having chest pain that did not go away with Tums.  Did go away with nitroglycerin .  This felt like a central chest pain radiating to both shoulders and associate with headache.  Now only having mild symptoms.  Review of Systems  Positive: CP Negative: Fever  Physical Exam  BP (!) 154/86 (BP Location: Right Arm)   Pulse 79   Temp 97.9 F (36.6 C) (Oral)   Resp 16   SpO2 99%  Gen:   Awake, no distress   Resp:  Normal effort  MSK:   Moves extremities without difficulty  Other:    Medical Decision Making  Medically screening exam initiated at 9:48 PM.  Appropriate orders placed.  Donette  N Callicott was informed that the remainder of the evaluation will be completed by another provider, this initial triage assessment does not replace that evaluation, and the importance of remaining in the ED until their evaluation is complete.     Shermon Warren LOISE, DEVONNA 09/18/24 2149

## 2024-09-18 NOTE — ED Triage Notes (Signed)
 Quick triage note, pt reports sudden onset chest pain that started after eating dinner, reports took Tums without relief, reports then took a nitroglycerin  with relief. Reports CP now 2/10. Cardiac hx.  Denies SHOB/N/V.

## 2024-09-19 ENCOUNTER — Telehealth: Payer: Self-pay | Admitting: Internal Medicine

## 2024-09-19 LAB — TROPONIN I (HIGH SENSITIVITY): Troponin I (High Sensitivity): 9 ng/L (ref <18)

## 2024-09-19 NOTE — Telephone Encounter (Signed)
 ED note reviewed. Sudden onset chest pain after eating dinner. Took 1 nitroglycerin  per ED notes. She left without being seen. BMP with stable renal function. CBC no anemia. Troponin negative x 2 - no evidence of heart attack.    Needs follow up with Dr. Santo or APP within 1-3 weeks.   Seaborn Nakama S Melayna Robarts, NP

## 2024-09-19 NOTE — ED Notes (Signed)
Pt informed registration she was leaving  

## 2024-09-19 NOTE — Telephone Encounter (Signed)
 Pt c/o of Chest Pain: STAT if active (IN THIS MOMENT) CP, including tightness, pressure, jaw pain, shoulder/upper arm/back pain, SOB, nausea, and vomiting.  1. Are you having CP right now (tightness, pressure, or discomfort)? no  2. Are you experiencing any other symptoms (ex. SOB, nausea, vomiting, sweating)? no  3. How long have you been experiencing CP? Just last night   4. Is your CP continuous or coming and going? Continuous   5. Have you taken Nitroglycerin ? yes  6. If CP returns before callback, please consider calling 911. ?    Pt states she went to Ed last night but they did not go over blood work. Requesting to see Dr.  Please advise.

## 2024-10-25 ENCOUNTER — Encounter (HOSPITAL_BASED_OUTPATIENT_CLINIC_OR_DEPARTMENT_OTHER): Payer: Self-pay

## 2024-10-26 NOTE — Progress Notes (Unsigned)
 " Cardiology Office Note   Date:  10/27/2024  ID:  Donna  SARGUN Guerrero, DOB 08-02-1941, MRN [id number] PCP: Dwight Trula SQUIBB, MD  [facility] Providers Cardiologist:  Stanly DELENA Leavens, MD   History of Present Illness Donna  ORTENCIA Guerrero is a 84 y.o. female who is here today for follow-up of coronary artery disease. Reports she is feeling well and is planning to travel to [place] with her son and daughter soon. She is on Livalo for management of hyperlipidemia and her endocrinologist recently increased her dose to 2 mg daily to improve LDL levels.    She was last seen 06/2024, She experiences swelling in her right knee without pain, managed with ice and rest.  She was formerly a ballerina and was surprised that imaging of her knees reveals no abnormalities. States she finally feels back to normal since having CABG 1 year ago, and is gradually increasing her activity with walking.  She notes occasional discomfort in the upper left chest which she attributes to postsurgical changes.  She denies chest pain, shortness of breath, palpitations, orthopnea, PND, edema, presyncope, syncope.   Today, she presented for ED follow-up. She had chest pain after eating a sandwich, which prompted an emergency room visit where myocardial infarction was ruled out. The pain has resolved and she has no current chest discomfort.  She is one year post-CABG and has had intermittent mild discomfort in the upper left chest, now resolved. She feels fully healed and remains active, including recent international travel.  She is on Trulicity for diabetes and has made dietary changes including avoiding red meat. She also takes pitavastatin for cholesterol, with a recent dose increase. She will have follow-up labs done with her PCP in March.  Reports no shortness of breath nor dyspnea on exertion. Reports no chest pain, pressure, or tightness. No edema, orthopnea, PND. Reports no palpitations.   Discussed the use of AI scribe  software for clinical note transcription with the patient, who gave verbal consent to proceed.  ROS: pertinent ROS in HPI  Studies Reviewed      EKG from 12/15   Echo 07/22/23  IMPRESSIONS     1. Left ventricular ejection fraction, by estimation, is 55 to 60%. The  left ventricle has normal function. The left ventricle has no regional  wall motion abnormalities. Left ventricular diastolic parameters are  consistent with Grade I diastolic  dysfunction (impaired relaxation).   2. Right ventricular systolic function is normal. The right ventricular  size is normal. Tricuspid regurgitation signal is inadequate for assessing  PA pressure.   3. The mitral valve is normal in structure. Trivial mitral valve  regurgitation. No evidence of mitral stenosis.   4. The aortic valve is tricuspid. There is moderate calcification of the  aortic valve. Aortic valve regurgitation is trivial. Aortic valve  sclerosis/calcification is present, without any evidence of aortic  stenosis. Aortic valve mean gradient measures  9.0 mmHg.   5. Aortic dilatation noted. There is mild dilatation of the ascending  aorta, measuring 39 mm.   6. The inferior vena cava is normal in size with greater than 50%  respiratory variability, suggesting right atrial pressure of 3 mmHg.   Physical Exam VS:  BP 138/68   Pulse 72   Ht 5' (1.524 m)   Wt 144 lb (65.3 kg)   SpO2 99%   BMI 28.12 kg/m        Wt Readings from Last 3 Encounters:  10/27/24 144 lb (65.3 kg)  06/08/24 144 lb (65.3 kg)  01/24/24 142 lb (64.4 kg)    GEN: Well nourished, well developed in no acute distress NECK: No JVD; No carotid bruits CARDIAC: RRR, no murmurs, rubs, gallops RESPIRATORY:  Clear to auscultation without rales, wheezing or rhonchi  ABDOMEN: Soft, non-tender, non-distended EXTREMITIES:  No edema; No deformity   ASSESSMENT AND PLAN  Coronary artery disease, status post CABG Status post CABG with stable cardiac health.  Recent ER visit ruled out myocardial infarction. - Scheduled follow-up with Dr. Santo -She can continue her current medications as prescribed  Mixed hyperlipidemia On pitavastatin with LDL at 105 mg/dL. Goal < 55.  Awaiting updated lipid panel results to assess efficacy of dosage adjustment. - Ensure lipid panel is performed at next appointment with primary care physician. - Review lipid panel results to determine if further adjustment of pitavastatin is necessary.  Type 2 diabetes mellitus   Trulicity was recently increased per her PCP.  -continue routine monitoring of A1C with goal < 7   Dispo: She can follow-up in a few months with MD  Signed, Orren LOISE Fabry, PA-C   "

## 2024-10-27 ENCOUNTER — Ambulatory Visit: Attending: Physician Assistant | Admitting: Physician Assistant

## 2024-10-27 ENCOUNTER — Encounter: Payer: Self-pay | Admitting: Physician Assistant

## 2024-10-27 VITALS — BP 138/68 | HR 72 | Ht 60.0 in | Wt 144.0 lb

## 2024-10-27 DIAGNOSIS — I10 Essential (primary) hypertension: Secondary | ICD-10-CM

## 2024-10-27 DIAGNOSIS — Z951 Presence of aortocoronary bypass graft: Secondary | ICD-10-CM

## 2024-10-27 DIAGNOSIS — I5032 Chronic diastolic (congestive) heart failure: Secondary | ICD-10-CM

## 2024-10-27 DIAGNOSIS — E782 Mixed hyperlipidemia: Secondary | ICD-10-CM

## 2024-10-27 DIAGNOSIS — I2510 Atherosclerotic heart disease of native coronary artery without angina pectoris: Secondary | ICD-10-CM

## 2024-10-27 DIAGNOSIS — E118 Type 2 diabetes mellitus with unspecified complications: Secondary | ICD-10-CM | POA: Diagnosis not present

## 2024-10-27 MED ORDER — NITROGLYCERIN 0.4 MG SL SUBL: 0.4000 mg | SUBLINGUAL_TABLET | SUBLINGUAL | 2 refills | Status: AC | PRN

## 2024-10-27 NOTE — Patient Instructions (Signed)
 Medication Instructions:  Your physician recommends that you continue on your current medications as directed. Please refer to the Current Medication list given to you today. *If you need a refill on your cardiac medications before your next appointment, please call your pharmacy*  Lab Work: COMPLETE A LIPID PANEL WITH YOUR PRIMARY If you have labs (blood work) drawn today and your tests are completely normal, you will receive your results only by: MyChart Message (if you have MyChart) OR A paper copy in the mail If you have any lab test that is abnormal or we need to change your treatment, we will call you to review the results.  Testing/Procedures: NONE ORDERED  Follow-Up: At [facility], you and your health needs are our priority.  As part of our continuing mission to provide you with exceptional heart care, our providers are all part of one team.  This team includes your primary Cardiologist (physician) and Advanced Practice Providers or APPs (Physician Assistants and Nurse Practitioners) who all work together to provide you with the care you need, when you need it.  Your next appointment:   3 month(s)  Provider:   Stanly DELENA Leavens, MD    We recommend signing up for the patient portal called MyChart.  Sign up information is provided on this After Visit Summary.  MyChart is used to connect with patients for Virtual Visits (Telemedicine).  Patients are able to view lab/test results, encounter notes, upcoming appointments, etc.  Non-urgent messages can be sent to your provider as well.   To learn more about what you can do with MyChart, go to [email or web address].   Other Instructions

## 2025-01-30 ENCOUNTER — Ambulatory Visit: Admitting: Internal Medicine
